# Patient Record
Sex: Male | Born: 1960
Health system: Southern US, Community
[De-identification: ages and names within clinical notes are randomized; demographics above are authoritative.]

## PROBLEM LIST (undated history)

## (undated) DIAGNOSIS — G4730 Sleep apnea, unspecified: Secondary | ICD-10-CM

## (undated) DIAGNOSIS — F909 Attention-deficit hyperactivity disorder, unspecified type: Secondary | ICD-10-CM

## (undated) DIAGNOSIS — G2581 Restless legs syndrome: Secondary | ICD-10-CM

## (undated) DIAGNOSIS — K219 Gastro-esophageal reflux disease without esophagitis: Secondary | ICD-10-CM

## (undated) DIAGNOSIS — J9811 Atelectasis: Secondary | ICD-10-CM

## (undated) DIAGNOSIS — S2249XA Multiple fractures of ribs, unspecified side, initial encounter for closed fracture: Secondary | ICD-10-CM

## (undated) DIAGNOSIS — K900 Celiac disease: Secondary | ICD-10-CM

## (undated) HISTORY — DX: Multiple fractures of ribs, unspecified side, initial encounter for closed fracture: S22.49XA

## (undated) HISTORY — DX: Gastro-esophageal reflux disease without esophagitis: K21.9

## (undated) HISTORY — PX: HERNIA REPAIR: SHX51

## (undated) HISTORY — DX: Atelectasis: J98.11

## (undated) HISTORY — PX: APPENDECTOMY: SHX54

## (undated) HISTORY — DX: Sleep apnea, unspecified: G47.30

## (undated) HISTORY — DX: Restless legs syndrome: G25.81

## (undated) HISTORY — DX: Attention-deficit hyperactivity disorder, unspecified type: F90.9

## (undated) HISTORY — DX: Celiac disease: K90.0

---

## 1998-12-08 ENCOUNTER — Ambulatory Visit (HOSPITAL_BASED_OUTPATIENT_CLINIC_OR_DEPARTMENT_OTHER): Admission: RE | Admit: 1998-12-08 | Discharge: 1998-12-08 | Payer: Self-pay | Admitting: Surgery

## 1999-03-07 ENCOUNTER — Encounter: Payer: Self-pay | Admitting: Family Medicine

## 1999-03-07 ENCOUNTER — Ambulatory Visit (HOSPITAL_COMMUNITY): Admission: RE | Admit: 1999-03-07 | Discharge: 1999-03-07 | Payer: Self-pay | Admitting: Family Medicine

## 2002-10-22 ENCOUNTER — Encounter: Payer: Self-pay | Admitting: Internal Medicine

## 2003-04-09 ENCOUNTER — Encounter: Payer: Self-pay | Admitting: Internal Medicine

## 2003-10-10 ENCOUNTER — Encounter: Payer: Self-pay | Admitting: Internal Medicine

## 2003-10-25 ENCOUNTER — Encounter: Payer: Self-pay | Admitting: Internal Medicine

## 2004-10-22 ENCOUNTER — Encounter: Payer: Self-pay | Admitting: Internal Medicine

## 2006-09-19 ENCOUNTER — Encounter: Payer: Self-pay | Admitting: Internal Medicine

## 2007-09-05 ENCOUNTER — Encounter: Payer: Self-pay | Admitting: Internal Medicine

## 2008-06-05 ENCOUNTER — Ambulatory Visit: Payer: Self-pay | Admitting: Internal Medicine

## 2008-06-05 DIAGNOSIS — G2581 Restless legs syndrome: Secondary | ICD-10-CM | POA: Insufficient documentation

## 2008-06-05 DIAGNOSIS — F329 Major depressive disorder, single episode, unspecified: Secondary | ICD-10-CM | POA: Insufficient documentation

## 2008-06-05 DIAGNOSIS — G4733 Obstructive sleep apnea (adult) (pediatric): Secondary | ICD-10-CM | POA: Insufficient documentation

## 2009-06-16 ENCOUNTER — Ambulatory Visit: Payer: Self-pay | Admitting: Internal Medicine

## 2009-06-17 LAB — CONVERTED CEMR LAB
ALT: 24 units/L (ref 0–53)
AST: 29 units/L (ref 0–37)
Albumin: 4 g/dL (ref 3.5–5.2)
Alkaline Phosphatase: 102 units/L (ref 39–117)
BUN: 13 mg/dL (ref 6–23)
Basophils Absolute: 0.1 10*3/uL (ref 0.0–0.1)
Basophils Relative: 0.7 % (ref 0.0–3.0)
Bilirubin Urine: NEGATIVE
Bilirubin, Direct: 0.1 mg/dL (ref 0.0–0.3)
CO2: 30 meq/L (ref 19–32)
Calcium: 8.9 mg/dL (ref 8.4–10.5)
Chloride: 104 meq/L (ref 96–112)
Cholesterol: 120 mg/dL (ref 0–200)
Creatinine, Ser: 1.1 mg/dL (ref 0.4–1.5)
Eosinophils Absolute: 0.1 10*3/uL (ref 0.0–0.7)
Eosinophils Relative: 1.6 % (ref 0.0–5.0)
GFR calc non Af Amer: 75.22 mL/min (ref >60)
Glucose, Bld: 91 mg/dL (ref 70–99)
HCT: 41 % (ref 39.0–52.0)
HDL: 29.2 mg/dL — ABNORMAL LOW (ref >39.00)
Hemoglobin: 13.8 g/dL (ref 13.0–17.0)
Ketones, ur: NEGATIVE mg/dL
LDL Cholesterol: 70 mg/dL (ref 0–99)
Leukocytes, UA: NEGATIVE
Lymphocytes Relative: 35.4 % (ref 12.0–46.0)
Lymphs Abs: 3.2 10*3/uL (ref 0.7–4.0)
MCHC: 33.6 g/dL (ref 30.0–36.0)
MCV: 88.8 fL (ref 78.0–100.0)
Monocytes Absolute: 0.7 10*3/uL (ref 0.1–1.0)
Monocytes Relative: 7.6 % (ref 3.0–12.0)
Neutro Abs: 4.9 10*3/uL (ref 1.4–7.7)
Neutrophils Relative %: 54.7 % (ref 43.0–77.0)
Nitrite: NEGATIVE
PSA: 0.64 ng/mL (ref 0.10–4.00)
Platelets: 288 10*3/uL (ref 150.0–400.0)
Potassium: 4 meq/L (ref 3.5–5.1)
RBC: 4.62 M/uL (ref 4.22–5.81)
RDW: 12.3 % (ref 11.5–14.6)
Sodium: 141 meq/L (ref 135–145)
Specific Gravity, Urine: >=1.03 (ref 1.000–1.030)
TSH: 2.25 microintl units/mL (ref 0.35–5.50)
Total Bilirubin: 0.6 mg/dL (ref 0.3–1.2)
Total CHOL/HDL Ratio: 4
Total Protein, Urine: NEGATIVE mg/dL
Total Protein: 7.7 g/dL (ref 6.0–8.3)
Triglycerides: 102 mg/dL (ref 0.0–149.0)
Urine Glucose: NEGATIVE mg/dL
Urobilinogen, UA: 0.2 (ref 0.0–1.0)
VLDL: 20.4 mg/dL (ref 0.0–40.0)
WBC: 9 10*3/uL (ref 4.5–10.5)
pH: 6 (ref 5.0–8.0)

## 2009-06-30 ENCOUNTER — Ambulatory Visit: Payer: Self-pay | Admitting: Internal Medicine

## 2009-06-30 DIAGNOSIS — G4700 Insomnia, unspecified: Secondary | ICD-10-CM | POA: Insufficient documentation

## 2009-06-30 DIAGNOSIS — R109 Unspecified abdominal pain: Secondary | ICD-10-CM | POA: Insufficient documentation

## 2009-07-01 ENCOUNTER — Telehealth: Payer: Self-pay | Admitting: Gastroenterology

## 2009-07-02 ENCOUNTER — Ambulatory Visit: Payer: Self-pay | Admitting: Gastroenterology

## 2009-07-02 DIAGNOSIS — R197 Diarrhea, unspecified: Secondary | ICD-10-CM | POA: Insufficient documentation

## 2009-07-09 ENCOUNTER — Telehealth: Payer: Self-pay | Admitting: Internal Medicine

## 2009-07-11 ENCOUNTER — Telehealth (INDEPENDENT_AMBULATORY_CARE_PROVIDER_SITE_OTHER): Payer: Self-pay | Admitting: *Deleted

## 2009-07-15 ENCOUNTER — Telehealth (INDEPENDENT_AMBULATORY_CARE_PROVIDER_SITE_OTHER): Payer: Self-pay | Admitting: *Deleted

## 2009-07-16 ENCOUNTER — Ambulatory Visit: Payer: Self-pay | Admitting: Gastroenterology

## 2009-07-16 LAB — CONVERTED CEMR LAB
Fecal Occult Blood: NEGATIVE
OCCULT 1: NEGATIVE
OCCULT 2: NEGATIVE
OCCULT 3: NEGATIVE
OCCULT 4: NEGATIVE
OCCULT 5: NEGATIVE

## 2009-07-17 LAB — CONVERTED CEMR LAB: Sed Rate: 47 mm/hr — ABNORMAL HIGH (ref 0–22)

## 2009-07-18 ENCOUNTER — Ambulatory Visit: Payer: Self-pay | Admitting: Gastroenterology

## 2009-07-18 LAB — CONVERTED CEMR LAB: Tissue Transglutaminase Ab, IgA: >128 units — ABNORMAL HIGH (ref <7)

## 2009-07-21 ENCOUNTER — Encounter: Payer: Self-pay | Admitting: Gastroenterology

## 2009-07-21 ENCOUNTER — Ambulatory Visit: Payer: Self-pay | Admitting: Gastroenterology

## 2009-07-23 ENCOUNTER — Encounter: Payer: Self-pay | Admitting: Gastroenterology

## 2009-07-23 DIAGNOSIS — K900 Celiac disease: Secondary | ICD-10-CM | POA: Insufficient documentation

## 2009-07-29 ENCOUNTER — Telehealth: Payer: Self-pay | Admitting: Internal Medicine

## 2009-08-12 ENCOUNTER — Encounter: Admission: RE | Admit: 2009-08-12 | Discharge: 2009-08-12 | Payer: Self-pay | Admitting: Gastroenterology

## 2009-08-13 ENCOUNTER — Encounter: Payer: Self-pay | Admitting: Gastroenterology

## 2009-08-19 ENCOUNTER — Ambulatory Visit: Payer: Self-pay | Admitting: Gastroenterology

## 2009-08-19 DIAGNOSIS — K219 Gastro-esophageal reflux disease without esophagitis: Secondary | ICD-10-CM | POA: Insufficient documentation

## 2009-08-19 DIAGNOSIS — K2970 Gastritis, unspecified, without bleeding: Secondary | ICD-10-CM | POA: Insufficient documentation

## 2009-08-19 DIAGNOSIS — K2990 Gastroduodenitis, unspecified, without bleeding: Secondary | ICD-10-CM

## 2009-08-22 ENCOUNTER — Ambulatory Visit: Payer: Self-pay | Admitting: Internal Medicine

## 2009-08-22 ENCOUNTER — Encounter: Payer: Self-pay | Admitting: Gastroenterology

## 2010-01-19 ENCOUNTER — Encounter (INDEPENDENT_AMBULATORY_CARE_PROVIDER_SITE_OTHER): Payer: Self-pay | Admitting: *Deleted

## 2010-06-09 ENCOUNTER — Encounter: Payer: Self-pay | Admitting: Gastroenterology

## 2010-06-18 ENCOUNTER — Encounter (INDEPENDENT_AMBULATORY_CARE_PROVIDER_SITE_OTHER): Payer: Self-pay | Admitting: *Deleted

## 2010-12-06 LAB — CONVERTED CEMR LAB
ALT: 25 units/L (ref 0–53)
AST: 29 units/L (ref 0–37)
Albumin: 4 g/dL (ref 3.5–5.2)
Alkaline Phosphatase: 100 units/L (ref 39–117)
BUN: 10 mg/dL (ref 6–23)
Basophils Absolute: 0.1 10*3/uL (ref 0.0–0.1)
Basophils Relative: 0.8 % (ref 0.0–3.0)
Bilirubin, Direct: 0.1 mg/dL (ref 0.0–0.3)
CO2: 30 meq/L (ref 19–32)
Calcium: 9 mg/dL (ref 8.4–10.5)
Chloride: 100 meq/L (ref 96–112)
Cholesterol: 133 mg/dL (ref 0–200)
Creatinine, Ser: 1.2 mg/dL (ref 0.4–1.5)
Eosinophils Absolute: 0.2 10*3/uL (ref 0.0–0.7)
Eosinophils Relative: 2.7 % (ref 0.0–5.0)
GFR calc Af Amer: 83 mL/min
GFR calc non Af Amer: 68 mL/min
Glucose, Bld: 88 mg/dL (ref 70–99)
HCT: 40.1 % (ref 39.0–52.0)
HDL: 30 mg/dL — ABNORMAL LOW (ref >39.0)
Hemoglobin: 13.8 g/dL (ref 13.0–17.0)
LDL Cholesterol: 81 mg/dL (ref 0–99)
Lymphocytes Relative: 41.2 % (ref 12.0–46.0)
MCHC: 34.5 g/dL (ref 30.0–36.0)
MCV: 89.5 fL (ref 78.0–100.0)
Monocytes Absolute: 0.5 10*3/uL (ref 0.1–1.0)
Monocytes Relative: 7.4 % (ref 3.0–12.0)
Neutro Abs: 3 10*3/uL (ref 1.4–7.7)
Neutrophils Relative %: 47.9 % (ref 43.0–77.0)
PSA: 0.53 ng/mL (ref 0.10–4.00)
Platelets: 295 10*3/uL (ref 150–400)
Potassium: 4.4 meq/L (ref 3.5–5.1)
RBC: 4.48 M/uL (ref 4.22–5.81)
RDW: 12.8 % (ref 11.5–14.6)
Sodium: 139 meq/L (ref 135–145)
TSH: 2.2 microintl units/mL (ref 0.35–5.50)
Total Bilirubin: 0.7 mg/dL (ref 0.3–1.2)
Total CHOL/HDL Ratio: 4.4
Total Protein: 7.6 g/dL (ref 6.0–8.3)
Triglycerides: 110 mg/dL (ref 0–149)
VLDL: 22 mg/dL (ref 0–40)
Vit D, 1,25-Dihydroxy: >150 — ABNORMAL HIGH (ref 30–89)
WBC: 6.5 10*3/uL (ref 4.5–10.5)

## 2010-12-08 NOTE — Procedures (Signed)
Summary: Endoscopy   EGD  Procedure date:  07/21/2009  Findings:      Location: [place]    Procedures Next Due Date:    EGD: 07/2010 ENDOSCOPY PROCEDURE REPORT  PATIENT:  Aaron [name], Aaron [name]  MR#:  [id number] BIRTHDATE:   08-09-1961, 50 yrs. old   GENDER:   male  ENDOSCOPIST:   Norberto Sorenson T. Fuller Plan, MD, [facility]    PROCEDURE DATE:  07/21/2009 PROCEDURE:  EGD with biopsy ASA CLASS:   Class I INDICATIONS: diarrhea, elevated celiac disease serologies  MEDICATIONS:   Versed 2 mg IV, residual sedation present from prior procedure TOPICAL ANESTHETIC:   Exactacain Spray  DESCRIPTION OF PROCEDURE:   After the risks benefits and alternatives of the procedure were thoroughly explained, informed consent was obtained.  The LB [id number] endoscope was introduced through the mouth and advanced to the second portion of the duodenum, without limitations.  The instrument was slowly withdrawn as the mucosa was fully examined. <<PROCEDUREIMAGES>>          <<OLD IMAGES>>  The duodenal bulb was normal in appearance, as was the postbulbar duodenum. In the second portion of the duodenum, multiple biopsies were obtained and sent to pathology.  Mild, nonerosive gastritis was found in the body and fundus of the stomach. Multiple biopsies were obtained. Esophagitis was found in the distal esophagus. It was erosive. LA Class Grade A. Otherwise the examination was normal. Retroflexed views revealed no abnormalities.    The scope was then withdrawn from the patient and the procedure completed.  COMPLICATIONS:   None  ENDOSCOPIC IMPRESSION:  1) Normal duodenum   2) Moderate gastritis  3) Erosive esophagitis  RECOMMENDATIONS:  1) await pathology results  2) anti-reflux regimen  3) follow-up: GI clinic 1 month 4) PPI qam, omeprazole 14m PO qam #30, 11 refills    Skyah Hannon T. SFuller Plan MD, FMarval Regal   CC: JBiagio Borg MD    REPORT OF SURGICAL PATHOLOGY   Case #:  [id number] Name: Aaron [name] Office Chart Number:  N/A [id number]: [id number]: JChrystie Nose PSaralyn Pilar MD DOB/Age  [birth year](Age: [age]    Gender: M Date Taken:  07/21/2009 Date Received: 07/21/2009   FINAL DIAGNOSIS   ***MICROSCOPIC EXAMINATION AND DIAGNOSIS***   1.  COLON, RANDOM BIOPSIES:  MELANOSIS COLI.  NO ACTIVE INFLAMMATION, MICROSCOPIC COLITIS, OR GRANULOMAS.   2. COLON, SIGMOID POLYP:  MELANOSIS COLI.  NO ACTIVE INFLAMMATION, ADENOMATOUS CHANGE, OR EVIDENCE OF MALIGNANCY.   3. COLON, SIGMOID AND RECTAL BIOPSIES:  FOCAL ACTIVE COLITIS.  SEE COMMENT.        4. DUODENAL BIOPSIES:  FINDINGS CONSISTENT WITH CELIAC SPRUE.   5. GASTRIC BIOPSIES:  CHRONIC MILDLY ACTIVE GASTRITIS.  NO HELICOBACTER PYLORI, DYSPLASIA, OR MALIGNANCY IDENTIFIED.   COMMENT 1. The random biopsies have normal crypt architecture and there are focal pigmented macrophages in the lamina propria consistent with melanosis coli.  No active inflammation, granulomas, or microscopic colitis is identified.   2. The sigmoid biopsy also shows focal pigmented macrophages in the lamina propria consistent with melanosis coli. No active inflammation or granulomas are identified.   3. The sigmoid and rectal biopsies show patchy foci with mild active neutrophilic cryptitis. There is also focal, minimal altered crypt architecture.  The differential diagnosis includes Crohn' s and inflammatory changes from an area of diverticulitis. No granulomas are identified.   4. The duodenal biopsies show partial loss of villous architecture associated with increased infiltrate in the lamina propria, increased intraepithelial infiltrate  and crypt hyperplasia.  This constellation is consistent with celiac sprue. No granulomas are identified.   5. A Warthin-Starry stain is performed to determine the possibility of the presence of Helicobacter pylori. The Warthin-Starry stain is negative for organisms of  Helicobacter pylori. The control(s) stained appropriately.  (JP:jy) 07/22/09   jy Date Reported:  07/23/2009     Chrystie Nose. Saralyn Pilar, MD *** Electronically Signed Out By JDP ***    July 23, 2009 MRN: [id number]    Aaron [name] [address] [place], [place]  [address]    Dear Mr. Delma Post,  I am pleased to inform you that the biopsies taken during your recent endoscopic examination did not show any evidence of cancer upon pathologic examination.   The biopsies showed gastritis and celiac disease.  Please call [phone] to schedule a return visit to review      your condition.  Continue with the treatment plan as outlined on the day of your      exam.  You should have a repeat endoscopic examination for this problem              in 1 year.  Please call us if you are having persistent problems or have questions about your condition that have not been fully answered at this time.  Sincerely,  Ladene Artist MD [facility]  This letter has been electronically signed by your physician. This report was created from the original endoscopy report, which was reviewed and signed by the above listed endoscopist.

## 2010-12-08 NOTE — Consult Note (Signed)
Summary: [facility]  [facility]   Imported By: Jerrye Noble D'jimraou 06/07/2008 14:36:31  _____________________________________________________________________  External Attachment:    Type:   Image     Comment:   External Document

## 2010-12-08 NOTE — Miscellaneous (Signed)
Summary: Recovery/med  Clinical Lists Changes  Medications: Added new medication of OMEPRAZOLE 20 MG CPDR (OMEPRAZOLE) As directed - Signed Rx of OMEPRAZOLE 20 MG CPDR (OMEPRAZOLE) As directed;  #30 x 11;  Signed;  Entered by: Ernestine Conrad RN;  Authorized by: Ladene Artist MD [facility];  Method used: Electronically to [facility]. [place]. [phone]*, [address], [place], [place], [place]  [address], Ph: [phone] or [phone], Fax: [phone] Observations: Added new observation of ALLERGY REV: Done (07/21/2009 11:18)    Prescriptions: OMEPRAZOLE 20 MG CPDR (OMEPRAZOLE) As directed  #30 x 11   Entered by:   Ernestine Conrad RN   Authorized by:   Ladene Artist MD [facility]   Signed by:   Ernestine Conrad RN on 07/21/2009   Method used:   Electronically to        [facility]. [address] [phone]* (retail)       [address]       [place], [place]  [address]       Ph: [phone] or [phone]       Fax: [phone]   RxID:   [id number]

## 2010-12-08 NOTE — Consult Note (Signed)
Summary: [facility]   Imported By: Edmonia James 05/01/2008 12:41:51  _____________________________________________________________________  External Attachment:    Type:   Image     Comment:   External Document

## 2010-12-08 NOTE — Assessment & Plan Note (Signed)
Summary: NEW PATIENT PHYSICAL/RESCHED 4X/LED   Vital Signs:  Patient Profile:   50 Years Old Male Weight:      202 pounds Temp:     97.0 degrees F oral Pulse rate:   58 / minute BP sitting:   130 / 84  (right arm) Cuff size:   regular  Vitals Entered By: Julious Payer (June 05, 2008 2:30 PM)                  Chief Complaint:  new pt. CPX.  History of Present Illness: tried the requip and the mirapex but seemed to make him worse; some ongoing nerves; says many meds have the opposite effect for him such as antihistamines that make him hyper    Updated Prior Medication List: FISH OIL   OIL (FISH OIL) Take 1 tablet by mouth once a day MIRTAZAPINE 30 MG  TBDP (MIRTAZAPINE) 1 by mouth at bedtime  Current Allergies (reviewed today): No known allergies   Past Medical History:    Reviewed history and no changes required:       chronic insomnia       OSA       RLS       Depression  Past Surgical History:    Reviewed history and no changes required:       Appendectomy-1997       Hernia-2000   Family History:    Reviewed history and no changes required:       Family History of Alcoholism/Addiction - father       Family History of Arthritis - mother bilat hip replacements       Family History Diabetes - mother       Family History Hypertension - both parents  Social History:    Reviewed history and no changes required:       Alcohol use-yes       Married       3 children       work - Electrical engineer - Production designer, theatre/television/film       Never Smoked   Risk Factors:  Tobacco use:  never Alcohol use:  yes    Type:  beer, mixed drinks   Review of Systems  The patient denies anorexia, fever, weight loss, weight gain, vision loss, decreased hearing, hoarseness, chest pain, syncope, dyspnea on exertion, peripheral edema, prolonged cough, headaches, hemoptysis, abdominal pain, melena, hematochezia, severe indigestion/heartburn, hematuria, incontinence, genital sores, muscle weakness,  suspicious skin lesions, transient blindness, difficulty walking, depression, unusual weight change, abnormal bleeding, enlarged lymph nodes, angioedema, breast masses, and testicular masses.         all otherwise negative    Physical Exam  General:     Well-developed,well-nourished,in no acute distress; alert,appropriate and cooperative throughout examination Head:     Normocephalic and atraumatic without obvious abnormalities. No apparent alopecia or balding. Eyes:     No corneal or conjunctival inflammation noted. EOMI. Perrla. Funduscopic exam benign, without hemorrhages, exudates or papilledema. Vision grossly normal. Ears:     External ear exam shows no significant lesions or deformities.  Otoscopic examination reveals clear canals, tympanic membranes are intact bilaterally without bulging, retraction, inflammation or discharge. Hearing is grossly normal bilaterally. Nose:     External nasal examination shows no deformity or inflammation. Nasal mucosa are pink and moist without lesions or exudates. Mouth:     Oral mucosa and oropharynx without lesions or exudates.  Teeth in good repair. Neck:     No deformities, masses,  or tenderness noted. Lungs:     Normal respiratory effort, chest expands symmetrically. Lungs are clear to auscultation, no crackles or wheezes. Heart:     Normal rate and regular rhythm. S1 and S2 normal without gallop, murmur, click, rub or other extra sounds. Abdomen:     Bowel sounds positive,abdomen soft and non-tender without masses, organomegaly or hernias noted. Genitalia:     Testes bilaterally descended without nodularity, tenderness or masses. No scrotal masses or lesions. No penis lesions or urethral discharge. Msk:     No deformity or scoliosis noted of thoracic or lumbar spine.   Extremities:     No clubbing, cyanosis, edema, or deformity noted with normal full range of motion of all joints.   Neurologic:     No cranial nerve deficits noted.  Station and gait are normal. Plantar reflexes are down-going bilaterally. DTRs are symmetrical throughout. Sensory, motor and coordinative functions appear intact.    Impression & Recommendations:  Problem # 1:  Preventive Health Care (ICD-V70.0) Overall doing well, up to date, counseled on routine health concerns for screening and prevention, immunizations up to date or declined, labs reviewed, ecg reviewed, to do tetanus today Orders: EKG w/ Interpretation (93000) TLB-BMP (Basic Metabolic Panel-BMET) (15945-OPFYTWK) TLB-CBC Platelet - w/Differential (85025-CBCD) TLB-Hepatic/Liver Function Pnl (80076-HEPATIC) TLB-Lipid Panel (80061-LIPID) TLB-PSA (Prostate Specific Antigen) (84153-PSA) TLB-TSH (Thyroid Stimulating Hormone) (84443-TSH) T-Vitamin D (25-Hydroxy) (46286-38177)   Problem # 2:  RESTLESS LEGS SYNDROME (ICD-333.94) try the mirtazipine 30 at bedtime - hopefully sedative enough to help with the RLS that wakes him up during the night; consider clonopin if no help  Complete Medication List: 1)  Fish Oil Oil (Fish oil) .... Take 1 tablet by mouth once a day 2)  Mirtazapine 30 Mg Tbdp (Mirtazapine) .Marland Kitchen.. 1 by mouth at bedtime  Other Orders: EKG w/ Interpretation (93000)   Patient Instructions: 1)  Please take all new medications as prescribed 2)  Continue all medications that you may have been taking previously 3)  Please go to the Lab in the basement for your blood tests today 4)  Please schedule a follow-up appointment in 1 year or sooner if needed   Prescriptions: MIRTAZAPINE 30 MG  TBDP (MIRTAZAPINE) 1 by mouth at bedtime  #30 x 11   Entered and Authorized by:   Biagio Borg MD   Signed by:   Biagio Borg MD on 06/05/2008   Method used:   Print then Give to Patient   RxID:   [id number]  ]  Orders Added: 1)  EKG w/ Interpretation [93000] 2)  EKG w/ Interpretation [93000] 3)  TLB-BMP (Basic Metabolic Panel-BMET) [19166-MAYOKHT] 4)  TLB-CBC Platelet -  w/Differential [85025-CBCD] 5)  TLB-Hepatic/Liver Function Pnl [80076-HEPATIC] 6)  TLB-Lipid Panel [80061-LIPID] 7)  TLB-PSA (Prostate Specific Antigen) [84153-PSA] 8)  TLB-TSH (Thyroid Stimulating Hormone) [84443-TSH] 9)  T-Vitamin D (25-Hydroxy) [97741-42395] 10)  New Patient 40-64 years [99386]    Tetanus/Td Vaccine    Vaccine Type: Tdap    Site: right deltoid    Mfr: GlaxoSmithKline    Dose: 0.5 ml    Route: IM    Given by: Julious Payer    Exp. Date: 10/12/2010    Lot #: [id number]    VIS given: 09/26/07 version given June 05, 2008.

## 2010-12-08 NOTE — Assessment & Plan Note (Signed)
Summary: cpx/$50/bcbs   Vital Signs:  Patient profile:   50 year old male Height:      72 inches Weight:      203.25 pounds BMI:     27.67 O2 Sat:      94 % Temp:     97.1 degrees F oral Pulse rate:   61 / minute BP sitting:   108 / 84  (left arm) Cuff size:   regular  Vitals Entered By: Hector Brunswick (June 30, 2009 1:08 PM)  Preventive Care Screening  Last Tetanus Booster:    Date:  11/08/2006    Results:  Tdap   CC: CPX   CC:  CPX.  History of Present Illness: here with ongoing issues with loose stool and freq up to 4 or 5 times per day with mucous and abd pains, dull and crampy, low grade but persistent and overall just does not "feel well"  ; TUMs and pepto bismol can help some;  some occasional accidents with flatulence involving stool as well; lost about 10 lbs he thinks; no nausea or vomit; discomfort mostly bilaterl lower, s/p appy 1997 and subsequent incisional hernia repair 3 yrs later; food doesnt necessailoy make it worse but sometimes seems to "go straight through" ;  tries to stay active wit swiimming almost 5oo yds daily and 2 miles running some days but not as active lately;  ? more stress lately but has been ongoing since early july and really not stressed then, and has persisted ; no hematochezia;  Problems Prior to Update: 1)  Abdominal Pain Other Specified Site  (ICD-789.09) 2)  Insomnia-sleep Disorder-unspec  (ICD-780.52) 3)  Preventive Health Care  (ICD-V70.0) 4)  Preventive Health Care  (ICD-V70.0) 5)  Depression  (ICD-311) 6)  Restless Legs Syndrome  (ICD-333.94) 7)  Obstructive Sleep Apnea  (ICD-327.23) 8)  Family History Diabetes 1st Degree Relative  (ICD-V18.0) 9)  Family History of Alcoholism/addiction  (ICD-V61.41)  Medications Prior to Update: 1)  Fish Oil   Oil (Fish Oil) .... Take 1 Tablet By Mouth Once A Day 2)  Mirtazapine 30 Mg  Tbdp (Mirtazapine) .Marland Kitchen.. 1 By Mouth At Bedtime  Current Medications (verified): 1)  Fish Oil   Oil (Fish  Oil) .... Take 1 Tablet By Mouth Once A Day 2)  Zolpidem Tartrate 10 Mg Tabs (Zolpidem Tartrate) .Marland Kitchen.. 1 By Mouth At Bedtime As Needed 3)  Pramipexole Dihydrochloride 0.5 Mg Tabs (Pramipexole Dihydrochloride) .Marland Kitchen.. 1po At Bedtime For Rls  Allergies (verified): No Known Drug Allergies  Past History:  Past Surgical History: Last updated: 06/05/2008 Appendectomy-1997 Hernia-2000  Family History: Last updated: 06/05/2008 Family History of Alcoholism/Addiction - father Family History of Arthritis - mother bilat hip replacements Family History Diabetes - mother Family History Hypertension - both parents  Social History: Last updated: 06/05/2008 Alcohol use-yes Married 3 children work - Electrical engineer - Production designer, theatre/television/film Never Smoked  Risk Factors: Smoking Status: never (06/05/2008)  Past Medical History: chronic insomnia OSA - mild RLS Depression  Review of Systems  The patient denies anorexia, fever, weight loss, weight gain, vision loss, decreased hearing, hoarseness, chest pain, syncope, dyspnea on exertion, peripheral edema, prolonged cough, headaches, hemoptysis, abdominal pain, melena, hematochezia, severe indigestion/heartburn, hematuria, incontinence, muscle weakness, suspicious skin lesions, transient blindness, difficulty walking, depression, unusual weight change, abnormal bleeding, enlarged lymph nodes, and angioedema.         all otherwise negative - 12 system review done except for a general weakness  Physical Exam  General:  alert and  overweight-appearing.   Head:  normocephalic and atraumatic.   Eyes:  vision grossly intact, pupils equal, and pupils round.   Ears:  R ear normal and L ear normal.   Nose:  no external deformity and no nasal discharge.   Mouth:  no gingival abnormalities and pharynx pink and moist.   Neck:  supple and no masses.   Lungs:  normal respiratory effort and normal breath sounds.   Heart:  normal rate and regular rhythm.   Abdomen:   soft, non-tender, and normal bowel sounds.   Msk:  normal ROM, no joint tenderness, and no joint swelling.   Extremities:  no edema, no erythema  Neurologic:  cranial nerves II-XII intact and strength normal in all extremities.     Impression & Recommendations:  Problem # 1:  Preventive Health Care (ICD-V70.0)  Overall doing well, up to date, counseled on routine health concerns for screening and prevention, immunizations up to date or declined, labs reviewed, ecg reviewed  Orders: EKG w/ Interpretation (93000)  Problem # 2:  INSOMNIA-SLEEP DISORDER-UNSPEC (ICD-780.52)  His updated medication list for this problem includes:    Zolpidem Tartrate 10 Mg Tabs (Zolpidem tartrate) .Marland Kitchen... 1 by mouth at bedtime as needed treat as above, f/u any worsening signs or symptoms   Problem # 3:  RESTLESS LEGS SYNDROME (ICD-333.94) try the mirapex qhs  Problem # 4:  ABDOMINAL PAIN OTHER SPECIFIED SITE (ICD-789.09) mid to lower abd, chronic x 6 mo, no blood but has mucous and wt loss - refer GI - likely need EGD and colonoscopy but pt wants to talk to wife about [facility] to be referred to  Complete Medication List: 1)  Fish Oil Oil (Fish oil) .... Take 1 tablet by mouth once a day 2)  Zolpidem Tartrate 10 Mg Tabs (Zolpidem tartrate) .Marland Kitchen.. 1 by mouth at bedtime as needed 3)  Pramipexole Dihydrochloride 0.5 Mg Tabs (Pramipexole dihydrochloride) .Marland Kitchen.. 1po at bedtime for rls  Patient Instructions: 1)  Please take all new medications as prescribed  2)  Continue all previous medications as before this visit  3)  Please schedule a follow-up appointment in 1 year or sooner if needed Prescriptions: PRAMIPEXOLE DIHYDROCHLORIDE 0.5 MG TABS (PRAMIPEXOLE DIHYDROCHLORIDE) 1po at bedtime for RLS  #30 x 5   Entered and Authorized by:   Biagio Borg MD   Signed by:   Biagio Borg MD on 06/30/2009   Method used:   Print then Give to Patient   RxID:   [id number] ZOLPIDEM TARTRATE 10 MG TABS  (ZOLPIDEM TARTRATE) 1 by mouth at bedtime as needed  #30 x 5   Entered and Authorized by:   Biagio Borg MD   Signed by:   Biagio Borg MD on 06/30/2009   Method used:   Print then Give to Patient   RxID:   [id number]

## 2010-12-08 NOTE — Miscellaneous (Signed)
Summary: BONE DENSITY  Clinical Lists Changes  Orders: Added new Test order of T-Bone Densitometry [phone]) - Signed Added new Test order of T-Lumbar Vertebral Assessment [phone]) - Signed

## 2010-12-08 NOTE — Assessment & Plan Note (Signed)
Summary: follow up colon/endo celiac   History of Present Illness Visit Type: follow up  Primary GI MD: Joylene Igo MD [facility] Primary Provider: Cathlean Cower, MD Requesting Provider: n/a Chief Complaint: F/u from colon/endo. Pt states fatigue but denies any GI complaints History of Present Illness:   Aaron [name] has had almost complete resolution of his diarrhea on a gluten-free diet. He states he has 1 to 2 stools that are almost normal and formed. He has significant fatigue. He discontinued omeprazole as he was not having any upper gastrointestinal symptoms. He has multiple questions regarding long-term management celiac, gastritis, duodenitis, and GERD.   GI Review of Systems      Denies abdominal pain, acid reflux, belching, bloating, chest pain, dysphagia with liquids, dysphagia with solids, heartburn, loss of appetite, nausea, vomiting, vomiting blood, weight loss, and  weight gain.        Denies anal fissure, black tarry stools, change in bowel habit, constipation, diarrhea, diverticulosis, fecal incontinence, heme positive stool, hemorrhoids, irritable bowel syndrome, jaundice, light color stool, liver problems, rectal bleeding, and  rectal pain.   Current Medications (verified): 1)  Fish Oil   Oil (Fish Oil) .... Take 1 Tablet By Mouth Once A Day 2)  Pramipexole Dihydrochloride 0.5 Mg Tabs (Pramipexole Dihydrochloride) .Marland Kitchen.. 1po At Bedtime For Rls 3)  Iron 325 (65 Fe) Mg Tabs (Ferrous Sulfate) .... Take 3-4 Times Weekly 4)  Zolpidem Tartrate 10 Mg Tabs (Zolpidem Tartrate) .Marland Kitchen.. 1 By Mouth At Bedtime As Needed  Allergies (verified): No Known Drug Allergies  Past History:  Past Medical History: Chronic insomnia OSA - mild RLS Depression Celiac disease Erosive esophagitis Gastritis Duodenitis  Past Surgical History: Reviewed history from 06/05/2008 and no changes required. Appendectomy-1997 Hernia-2000  Family History: Reviewed history from 07/16/2009 and no changes  required. Family History of Alcoholism/Addiction - father Family History of Arthritis - mother bilat hip replacements Family History Diabetes - mother Family History Hypertension - both parents No FH of Colon Cancer:  Social History: Reviewed history from 07/02/2009 and no changes required. 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         The pertinent positives and negatives are noted as above and in the HPI. All other ROS were reviewed and were negative.   Vital Signs:  Patient profile:   50 year old male Height:      72 inches Weight:      197 pounds BMI:     26.81 BSA:     2.12 Pulse rate:   74 / minute Pulse rhythm:   regular BP sitting:   138 / 80  (left arm) Cuff size:   regular  Vitals Entered By: Aaron [name] CMA (August 19, 2009 1:40 PM)  Physical Exam  General:  Well developed, well nourished, no acute distress. Head:  Normocephalic and atraumatic. Mouth:  No deformity or lesions, dentition normal. Lungs:  Clear throughout to auscultation. Heart:  Regular rate and rhythm; no murmurs, rubs,  or bruits. Abdomen:  Soft, nontender and nondistended. No masses, hepatosplenomegaly or hernias noted. Normal bowel sounds. Psych:  Alert and cooperative. Normal mood and affect.   Impression & Recommendations:  Problem # 1:  CELIAC SPRUE (ICD-579.0) Long-term gluten-free diet. He has had a good response to the gluten-free diet thus far. He is advised to have a celiac antibody profile  performed in 6 months and at 12 months and to have a repeat endoscopy in one year. Schedule bone scan to rule out osteoporosis, osteopenia.  Problem # 2:  GERD (ICD-530.81) Erosive esophagitis on endoscopy without active reflux symptoms. We discussed the long-term management with omeprazole to prevent long-term complications. Long-term  antireflux measures supplied the patient.  Problem # 3:  GASTRITIS (ICD-535.50) omeprazole 20 mg daily for 8 weeks.  Patient Instructions: 1)  Call me back with a appt date and time for the Bone scan and I can schedule that for you.  2)  Please schedule a follow-up appointment in 6 months. 3)  The medication list was reviewed and reconciled.  All changed / newly prescribed medications were explained.  A complete medication list was provided to the patient / caregiver.  Appended Document: Orders Update    Clinical Lists Changes  Orders: Added new Test order of T-Bone Densitometry [phone]) - Signed

## 2010-12-08 NOTE — Letter (Signed)
Summary: Wellness Program  Wellness Program   Imported By: Jerrye Noble D'jimraou 06/07/2008 14:38:46  _____________________________________________________________________  External Attachment:    Type:   Image     Comment:   External Document

## 2010-12-08 NOTE — Letter (Signed)
Summary: Patient Notice- Colon Biospy Results  [facility]  [address] [place], [place] [address]   Phone: [phone]  Fax: [phone]        July 23, 2009 MRN: [id number]    Aaron [name] [address] [place], [place]  [address]    Dear [name],  I am pleased to inform you that the biopsies taken during your recent colonoscopy did not show any evidence of cancer upon pathologic examination. The biopsies showed a focal and nonspecific colitis which could be caused by a resolving infection.  Continue with the treatment plan as outlined on the day of your      exam.  You should have a repeat colonoscopy examination in 10 years.  Please call us if you are having persistent problems or have questions about your condition that have not been fully answered at this time.  Sincerely,  Ladene Artist MD [facility]  This letter has been electronically signed by your physician.

## 2010-12-08 NOTE — Miscellaneous (Signed)
Summary: Care plan/[facility]  Care plan/[employer]   Imported By: Bubba Hales 08/25/2009 08:18:36  _____________________________________________________________________  External Attachment:    Type:   Image     Comment:   External Document

## 2010-12-08 NOTE — Procedures (Signed)
Summary: Colonoscopy   Colonoscopy  Procedure date:  07/21/2009  Findings:      Location:  [place].    Procedures Next Due Date:    Colonoscopy: 07/2019 COLONOSCOPY PROCEDURE REPORT  PATIENT:  Aaron [name], Aaron [name]  MR#:  [id number] BIRTHDATE:   11-Mar-1961, 48 yrs. old   GENDER:   male  ENDOSCOPIST:   Norberto Sorenson T. Fuller Plan, MD, [facility]    PROCEDURE DATE:  07/21/2009 PROCEDURE:  Colonoscopy with biopsy ASA CLASS:   Class I INDICATIONS: 1) unexplained diarrhea   MEDICATIONS:    Fentanyl 125 mcg IV, Versed 12 mg IV  DESCRIPTION OF PROCEDURE:   After the risks benefits and alternatives of the procedure were thoroughly explained, informed consent was obtained.  Digital rectal exam was performed and revealed no abnormalities.   The LB [id number]-[id number] endoscope was introduced through the anus and advanced to the terminal ileum which was intubated for a short distance, without limitations.  The quality of the prep was excellent, using MoviPrep.  The instrument was then slowly withdrawn as the colon was fully examined. <<PROCEDUREIMAGES>>                <<OLD IMAGES>>  FINDINGS:  A mucosal abnormality in the rectum and sigmoid colon. It was erythematous. R/O prep related changes. Multiple biopsies were obtained and sent to pathology.  A sessile polyp was found in the sigmoid colon. It was 3 mm in size. The polyp was removed using cold biopsy forceps.  The terminal ileum appeared normal.  This was otherwise a normal examination of the colon.  Random biopsies were taken throughout the colon. Retroflexed views in the rectum revealed internal hemorrhoids, small. The time to cecum =  3.75  minutes. The scope was then withdrawn (time =  17.75  min) from the patient and the procedure completed.  COMPLICATIONS:   None  ENDOSCOPIC IMPRESSION:  1) Mucosal abnormality in the rectum and sigmoid colon  2) 3 mm sessile polyp in the sigmoid colon  3) Normal terminal ileum  4) Internal  hemorrhoids  RECOMMENDATIONS:  1) await pathology results  2) If the polyp removed today is adenomatous (pre-cancerous), you will need a repeat colonoscopy in 5 years. Otherwise you should continue to follow colorectal cancer screening guidelines for "routine risk" patients with colonoscopy in 10 years.     Pricilla Riffle. Fuller Plan, MD, [facility]    CC: Biagio Borg, MD   REPORT OF SURGICAL PATHOLOGY   Case #: [id number] Patient Name: Aaron [name], DOO. Office Chart Number:  N/A [id number] MRN: [id number] Pathologist: Chrystie Nose. Saralyn Pilar, MD DOB/Age  [birth year] (Age: 14)    Gender: M Date Taken:  07/21/2009 Date Received: 07/21/2009   FINAL DIAGNOSIS   ***MICROSCOPIC EXAMINATION AND DIAGNOSIS***   1.  COLON, RANDOM BIOPSIES:  MELANOSIS COLI.  NO ACTIVE INFLAMMATION, MICROSCOPIC COLITIS, OR GRANULOMAS.   2. COLON, SIGMOID POLYP:  MELANOSIS COLI.  NO ACTIVE INFLAMMATION, ADENOMATOUS CHANGE, OR EVIDENCE OF MALIGNANCY.   3. COLON, SIGMOID AND RECTAL BIOPSIES:  FOCAL ACTIVE COLITIS.  SEE COMMENT.        4. DUODENAL BIOPSIES:  FINDINGS CONSISTENT WITH CELIAC SPRUE.   5. GASTRIC BIOPSIES:  CHRONIC MILDLY ACTIVE GASTRITIS.  NO HELICOBACTER PYLORI, DYSPLASIA, OR MALIGNANCY IDENTIFIED.   COMMENT 1. The random biopsies have normal crypt architecture and there are focal pigmented macrophages in the lamina propria consistent with melanosis coli.  No active inflammation, granulomas, or microscopic colitis is identified.   2. The sigmoid biopsy also shows  focal pigmented macrophages in the lamina propria consistent with melanosis coli. No active inflammation or granulomas are identified.   3. The sigmoid and rectal biopsies show patchy foci with mild active neutrophilic cryptitis. There is also focal, minimal altered crypt architecture.  The differential diagnosis includes Crohn' s and inflammatory changes from an area of diverticulitis. No granulomas are identified.   4. The duodenal  biopsies show partial loss of villous architecture associated with increased infiltrate in the lamina propria, increased intraepithelial infiltrate and crypt hyperplasia.  This constellation is consistent with celiac sprue. No granulomas are identified.   5. A Warthin-Starry stain is performed to determine the possibility of the presence of Helicobacter pylori. The Warthin-Starry stain is negative for organisms of Helicobacter pylori. The control(s) stained appropriately.  (JP:jy) 07/22/09   jy Date Reported:  07/23/2009     Chrystie Nose. Saralyn Pilar, MD *** Electronically Signed Out By JDP ***    July 23, 2009 MRN: [id number]    Aaron [name] [address] [place], [place]  [address]    Dear [name],  I am pleased to inform you that the biopsies taken during your recent colonoscopy did not show any evidence of cancer upon pathologic examination. The biopsies showed a focal and nonspecific colitis which could be caused by a resolving infection.  Continue with the treatment plan as outlined on the day of your      exam.  You should have a repeat colonoscopy examination in 10 years.  Please call us if you are having persistent problems or have questions about your condition that have not been fully answered at this time.  Sincerely,  Ladene Artist MD [facility]  This letter has been electronically signed by your physician.  This report was created from the original endoscopy report, which was reviewed and signed by the above listed endoscopist.

## 2010-12-08 NOTE — Letter (Signed)
Summary: Endoscopy Letter  [facility]  [place], [place] [address]   Phone: [phone]  Fax: [phone]      June 18, 2010 MRN: [id number]   ORDELL PRICHETT [address] [place], [place]  [address]   Dear Mr. Delma Post,   According to your medical record, it is time for you to schedule an Endoscopy. Endoscopic screening is recommended for patients with certain upper digestive tract conditions because of associated increased risk for cancers of the upper digestive system.  This letter has been generated based on the recommendations made at the time of your prior procedure. If you feel that in your particular situation this may no longer apply, please contact our office.  Please call our office at [phone]) to schedule this appointment or to update your records at your earliest convenience.  Thank you for cooperating with [place] to provide you with the very best care possible.   Sincerely,  Norberto Sorenson T. Fuller Plan, M.D.  [facility] [phone]

## 2010-12-08 NOTE — Consult Note (Signed)
Summary: [facility]  [facility]   Imported By: Edmonia James 05/01/2008 12:36:59  _____________________________________________________________________  External Attachment:    Type:   Image     Comment:   External Document

## 2010-12-08 NOTE — Progress Notes (Signed)
  Phone Note Call from Patient Call back at [phone]   Caller: Patient Call For: Dr Jenny Reichmann Summary of Call: Pt left message on triage about recent office visit, pt would like a nurse to call him - he has a couple questions. Initial call taken by: Denice Paradise,  July 09, 2009 10:29 AM  Follow-up for Phone Call        called the number left in the other box on phone note and it was an automated system to a company could not get thru to even speak with pt so called pt house wife answered and stated to contact him on his cell number which she [phone].... called pt on cell .Marland Kitchen... he wanted to know could we send him a copy of his last BP reading for his job purposes because when he was in and Dr. Jenny Reichmann filled his form out he left that section blank on one of the pages.....done Follow-up by: Hector Brunswick,  July 09, 2009 12:00 PM

## 2010-12-08 NOTE — Progress Notes (Signed)
Summary: hemmoccult cards  Phone Note Outgoing Call   Call placed by: Christian Mate CMA Deborra Medina),  July 11, 2009 1:02 PM Summary of Call: pt was called and reminded to turn in hemmoccult cards. Initial call taken by: Christian Mate CMA ([place]),  July 11, 2009 1:03 PM

## 2010-12-08 NOTE — Assessment & Plan Note (Signed)
Summary: ABD PN/DIARRHEA/FH   History of Present Illness Visit Type: Initial Consult Primary GI MD: Joylene Igo MD [facility] Primary Provider: Cathlean Cower, MD Requesting Provider: Cathlean Cower, MD Chief Complaint: Abd Pain & diarrhea x5 daily, since early July History of Present Illness:   This is a 50 year old male with a history of diarrhea beginning in early July described as watery, urgent with mucous and mild lower abdominal cramping. No blood noted. No travel, antibiotics or med changes. Recent bloodwork negative.   GI Review of Systems    Reports abdominal pain and  bloating.     Location of  Abdominal pain: lower abdomen.    Denies acid reflux, belching, chest pain, dysphagia with liquids, dysphagia with solids, heartburn, loss of appetite, nausea, vomiting, vomiting blood, weight loss, and  weight gain.      Reports change in bowel habits and  diarrhea.     Denies anal fissure, black tarry stools, constipation, diverticulosis, fecal incontinence, heme positive stool, hemorrhoids, irritable bowel syndrome, jaundice, light color stool, liver problems, rectal bleeding, and  rectal pain.   Current Medications (verified): 1)  Fish Oil   Oil (Fish Oil) .... Take 1 Tablet By Mouth Once A Day 2)  Zolpidem Tartrate 10 Mg Tabs (Zolpidem Tartrate) .Marland Kitchen.. 1 By Mouth At Bedtime As Needed 3)  Pramipexole Dihydrochloride 0.5 Mg Tabs (Pramipexole Dihydrochloride) .Marland Kitchen.. 1po At Bedtime For Rls 4)  Iron 325 (65 Fe) Mg Tabs (Ferrous Sulfate) .... Take 3-4 Times Weekly  Allergies (verified): No Known Drug Allergies  Past History:  Past Medical History: chronic insomnia OSA - mild RLS Depression  Past Surgical History: Reviewed history from 06/05/2008 and no changes required. Appendectomy-1997 Hernia-2000  Family History: Reviewed history from 06/05/2008 and no changes required. 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         The pertinent positives and negatives are noted as above and in the HPI. All other ROS were reviewed and were negative.   Vital Signs:  Patient profile:   50 year old male Height:      72 inches Weight:      204.38 pounds BMI:     27.82 Pulse rate:   80 / minute Pulse rhythm:   regular BP sitting:   128 / 92  (left arm) Cuff size:   regular  Vitals Entered By: June McMurray [place] Deborra Medina) (July 02, 2009 1:47 PM)  Physical Exam  General:  Well developed, well nourished, no acute distress. Head:  Normocephalic and atraumatic. Eyes:  PERRLA, no icterus. Ears:  Normal auditory acuity. Mouth:  No deformity or lesions, dentition normal. Neck:  Supple; no masses or thyromegaly. Lungs:  Clear throughout to auscultation. Heart:  Regular rate and rhythm; no murmurs, rubs,  or bruits. Abdomen:  Soft, nontender and nondistended. No masses, hepatosplenomegaly or hernias noted. Normal bowel sounds. Msk:  Symmetrical with no gross deformities. Normal posture. Pulses:  Normal pulses noted. Extremities:  No clubbing, cyanosis, edema or deformities noted. Neurologic:  Alert and  oriented x4;  grossly normal neurologically. Cervical Nodes:  No significant cervical adenopathy. Inguinal Nodes:  No significant inguinal adenopathy. Psych:  Alert  and cooperative. Normal mood and affect.   Impression & Recommendations:  Problem # 1:  DIARRHEA (ICD-787.91) R/O infectious causes or microscopic colitis. Less likely IBD or colorectal neoplasms. Stool hemocults. Trial of Cipro and Flagyl for 7 days. If symptoms not resolved will need stool studies and colonoscopy.  Problem # 2:  SCREENING  COLORECTAL-CANCER (ICD-V76.51) Colorectal cancer screening is due even if diarrhea and abdominal pain resolve.  Patient Instructions: 1)  Please pick up your medications at your pharmacy. 2)  Please return your Hemoccult cards in the envelope provided. 3)  Copy sent to : Cathlean Cower, MD 4)  The medication list was reviewed and reconciled.  All changed / newly prescribed medications were explained.  A complete medication list was provided to the patient / caregiver. 5)  Please schedule a follow-up appointment in 2 weeks.   Prescriptions: METRONIDAZOLE 500 MG TABS (METRONIDAZOLE) Take 1 tablet by mouth two times a day for 7 days  #14 x 0   Entered by:   Abelino Derrick CMA ([place])   Authorized by:   Ladene Artist MD [facility]   Signed by:   Abelino Derrick CMA ([place]) on 07/02/2009   Method used:   Electronically to        [facility]. [address] [phone]* (retail)       [address] [place], [place]  [address]       Ph: [phone] or [phone]       Fax: [phone]   RxID:   [id number] CIPROFLOXACIN HCL 500 MG TABS (CIPROFLOXACIN HCL) Take 1 tablet by mouth two times a day for 7 days  #14 x 0   Entered by:   Abelino Derrick CMA ([place])   Authorized by:   Ladene Artist MD [facility]   Signed by:   Abelino Derrick CMA ([place]) on 07/02/2009   Method used:   Electronically to        [facility]. [address] [phone]* (retail)       [address]       [place], [place]  [address]       Ph: [phone] or [phone]       Fax: [phone]   RxID:   [id number]

## 2010-12-08 NOTE — Procedures (Signed)
Summary: Recall / [facility]  Recall / [facility]   Imported By: Rise Patience 07/08/2010 10:52:07  _____________________________________________________________________  External Attachment:    Type:   Image     Comment:   External Document

## 2010-12-08 NOTE — Consult Note (Signed)
Summary: [facility]   Imported By: Edmonia James 05/01/2008 12:38:50  _____________________________________________________________________  External Attachment:    Type:   Image     Comment:   External Document

## 2010-12-08 NOTE — Progress Notes (Signed)
Summary: Pt ?  Phone Note Call from Patient Call back at Work Phone [phone]   Caller: Patient [phone] Summary of Call: pt called requesting refills on Ambien generic that was removed by Dr. Silvio Pate office (pt preference). Pt insists that it was removed in error and is requesting MD add to med list and refill. please advise Initial call taken by: Crissie Sickles, [place],  July 29, 2009 11:06 AM  Follow-up for Phone Call        there is no record of ambien rx or taking off the med list since oct 2008 per EMR; ok for short rx - done hardcopy to LIM side B - dahlia  Follow-up by: Biagio Borg MD,  July 29, 2009 1:27 PM  Additional Follow-up for Phone Call Additional follow up Details #1::        pt aware, rx in cabinet for pt pick up Additional Follow-up by: Crissie Sickles, CMA,  July 29, 2009 1:42 PM    New/Updated Medications: ZOLPIDEM TARTRATE 10 MG TABS (ZOLPIDEM TARTRATE) 1 by mouth at bedtime as needed Prescriptions: ZOLPIDEM TARTRATE 10 MG TABS (ZOLPIDEM TARTRATE) 1 by mouth at bedtime as needed  #30 x 2   Entered and Authorized by:   Biagio Borg MD   Signed by:   Biagio Borg MD on 07/29/2009   Method used:   Print then Give to Patient   RxID:   [id number]

## 2010-12-08 NOTE — Consult Note (Signed)
Summary: [facility]   Imported By: Edmonia James 05/01/2008 12:40:02  _____________________________________________________________________  External Attachment:    Type:   Image     Comment:   External Document

## 2010-12-08 NOTE — Progress Notes (Signed)
Summary: hem cards  Phone Note Outgoing Call   Call placed by: Christian Mate CMA Deborra Medina),  July 15, 2009 2:21 PM Summary of Call: Called pt and ask about hem cards.  His wife dropped them off this morning. Initial call taken by: Christian Mate CMA ([place]),  July 15, 2009 2:21 PM

## 2010-12-08 NOTE — Assessment & Plan Note (Signed)
Summary: FOLLOW UP DIARRHEA/SP   History of Present Illness Visit Type: follow up  Primary GI MD: Joylene Igo MD [facility] Primary Provider: Cathlean Cower, MD Requesting Provider: n/a Chief Complaint: F/u from office visit on 07-02-09 for lower abd pain and diarrhea. Pt states that he is doing better but some days will have lower abd pain and diarrhea History of Present Illness:   This is a return office visit for diarrhea. He completed a course of Cipro and Flagyl. He states his diarrhea is now intermittent. On some days he has normal bowel movements and some days he has several loose, non bloody stools per day. Stool Hemoccults were negative. He is very reluctant to proceed with colonoscopy.   GI Review of Systems      Denies abdominal pain, acid reflux, belching, bloating, chest pain, dysphagia with liquids, dysphagia with solids, heartburn, loss of appetite, nausea, vomiting, vomiting blood, weight loss, and  weight gain.        Denies anal fissure, black tarry stools, change in bowel habit, constipation, diarrhea, diverticulosis, fecal incontinence, heme positive stool, hemorrhoids, irritable bowel syndrome, jaundice, light color stool, liver problems, rectal bleeding, and  rectal pain. Current Medications (verified): 1)  Fish Oil   Oil (Fish Oil) .... Take 1 Tablet By Mouth Once A Day 2)  Pramipexole Dihydrochloride 0.5 Mg Tabs (Pramipexole Dihydrochloride) .Marland Kitchen.. 1po At Bedtime For Rls 3)  Iron 325 (65 Fe) Mg Tabs (Ferrous Sulfate) .... Take 3-4 Times Weekly  Allergies (verified): No Known Drug Allergies  Past History:  Past Medical History: Reviewed history from 07/02/2009 and no changes required. chronic insomnia OSA - mild RLS Depression  Past Surgical History: Reviewed history from 06/05/2008 and no changes required. Appendectomy-1997 Hernia-2000  Family History: Family History of Alcoholism/Addiction - father Family History of Arthritis - mother bilat hip  replacements Family History Diabetes - mother Family History Hypertension - both parents No FH of Colon Cancer:  Social History: Reviewed history from 07/02/2009 and no changes required. 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 All other ROS were reviewed and were negative.   Vital Signs:  Patient profile:   50 year old male Height:      72 inches Weight:      207 pounds BMI:     28.18 BSA:     2.16 Pulse rate:   76 / minute Pulse rhythm:   regular BP sitting:   132 / 80  (left arm) Cuff size:   regular  Vitals Entered By: Hope Pigeon CMA (July 16, 2009 1:20 PM)  Physical Exam  General:  Well developed, well nourished, no acute distress. Head:  Normocephalic and atraumatic. Mouth:  No deformity or lesions, dentition normal. Lungs:  Clear throughout to auscultation. Heart:  Regular rate and rhythm; no murmurs, rubs,  or bruits. Abdomen:  Soft, nontender and nondistended. No masses, hepatosplenomegaly or hernias noted. Normal bowel sounds. Psych:  Alert and cooperative. Normal mood and affect.  Impression & Recommendations:  Problem # 1:  DIARRHEA (ICD-787.91) Persistent but slightly improved diarrhea. I have recommended proceeding with colonoscopy, however he is very reluctant to proceed with colonoscopy. Possible iron side effects, hold iron for 2 weeks. Trial of a daily probiotic for one month. If his symptoms persist he states he will return for  colonoscopy. Orders: TLB-Sedimentation Rate (ESR) (85652-ESR) T-Sprue Panel (Celiac Disease Aby Eval) (83516x3/[address])  Patient Instructions: 1)  Samples of Align given today.  Take one daily.  You may buy more at your pharmacy. 2)  Please go to the basement today to have your blood drawn. 3)  The medication list was reviewed and  reconciled.  All changed / newly prescribed medications were explained.  A complete medication list was provided to the patient / caregiver. 4)  Copy sent to : Cathlean Cower, MD 5)  Please schedule a follow-up appointment in 2 months.

## 2010-12-08 NOTE — Letter (Signed)
Summary: Patient Notice-Endo Biopsy Results  [facility]  [place], [place] [address]   Phone: [phone]  Fax: [phone]        July 23, 2009 MRN: [id number]    Aaron [name] [address] [place], [place]  [address]    Dear Aaron [name],  I am pleased to inform you that the biopsies taken during your recent endoscopic examination did not show any evidence of cancer upon pathologic examination.   The biopsies showed gastritis and celiac disease.  Please call [phone] to schedule a return visit to review      your condition.  Continue with the treatment plan as outlined on the day of your      exam.  You should have a repeat endoscopic examination for this problem              in 1 year.  Please call us if you are having persistent problems or have questions about your condition that have not been fully answered at this time.  Sincerely,  Ladene Artist MD [facility]  This letter has been electronically signed by your physician.

## 2010-12-08 NOTE — Letter (Signed)
Summary: Med List/[facility]  Med List/[facility]   Imported By: Edmonia James 05/01/2008 12:51:57  _____________________________________________________________________  External Attachment:    Type:   Image     Comment:   External Document

## 2010-12-08 NOTE — Letter (Signed)
Summary: Office Visit Letter  [facility]  [address] [place], [place] [address]   Phone: [phone]  Fax: [phone]      January 19, 2010 MRN: [id number]   Aaron [name] [address] [place], [place]  [address]   Dear [name],   According to our records, it is time for you to schedule a follow-up office visit with [place].   At your convenience, please call [phone] (option #2)to schedule an office visit. If you have any questions, concerns, or feel that this letter is in error, we would appreciate your call.   Sincerely,  Norberto Sorenson T. Fuller Plan, M.D.  [facility] Gastroenterology Division [phone]

## 2010-12-08 NOTE — Miscellaneous (Signed)
Summary: colon [facility] 08-18-09 11:00/diarrhea 787.91/sheri previst  Clinical Lists Changes  Medications: Added new medication of MOVIPREP 100 GM  SOLR (PEG-KCL-NACL-NASULF-NA ASC-C) As per prep instructions. - Signed Rx of MOVIPREP 100 GM  SOLR (PEG-KCL-NACL-NASULF-NA ASC-C) As per prep instructions.;  #1 x 0;  Signed;  Entered by: Ritta Slot RN;  Authorized by: Ladene Artist MD [facility];  Method used: Electronically to [facility]. [place]. [phone]*, [id number] [place], [place], [place]  [address], Ph: [phone] or [phone], Fax: [phone] Observations: Added new observation of NKA: T (07/18/2009 12:46)    Prescriptions: MOVIPREP 100 GM  SOLR (PEG-KCL-NACL-NASULF-NA ASC-C) As per prep instructions.  #1 x 0   Entered by:   Ritta Slot RN   Authorized by:   Ladene Artist MD [facility]   Signed by:   Ritta Slot RN on 07/18/2009   Method used:   Electronically to        [facility]. [address] [phone]* (retail)       [address]       [place], [place]  [address]       Ph: [phone] or [phone]       Fax: [phone]   RxID:   [id number]

## 2011-05-26 ENCOUNTER — Telehealth: Payer: Self-pay

## 2011-05-26 NOTE — Telephone Encounter (Addendum)
Pt does not want to schedule an appt, he does not have insurance at this time

## 2012-07-12 ENCOUNTER — Encounter: Payer: Self-pay | Admitting: Gastroenterology

## 2013-07-20 ENCOUNTER — Ambulatory Visit (INDEPENDENT_AMBULATORY_CARE_PROVIDER_SITE_OTHER): Payer: BC Managed Care – PPO | Admitting: Family Medicine

## 2013-07-20 VITALS — BP 120/80 | HR 86 | Temp 98.5°F | Resp 16 | Ht 71.0 in | Wt 209.0 lb

## 2013-07-20 DIAGNOSIS — R5381 Other malaise: Secondary | ICD-10-CM

## 2013-07-20 DIAGNOSIS — G2581 Restless legs syndrome: Secondary | ICD-10-CM

## 2013-07-20 DIAGNOSIS — K900 Celiac disease: Secondary | ICD-10-CM

## 2013-07-20 DIAGNOSIS — Z0000 Encounter for general adult medical examination without abnormal findings: Secondary | ICD-10-CM

## 2013-07-20 DIAGNOSIS — G4730 Sleep apnea, unspecified: Secondary | ICD-10-CM

## 2013-07-20 LAB — POCT CBC
Granulocyte percent: 48.3 %G (ref 37–80)
HCT, POC: 45.4 % (ref 43.5–53.7)
Hemoglobin: 14.7 g/dL (ref 14.1–18.1)
Lymph, poc: 2.6 (ref 0.6–3.4)
MCH, POC: 30.9 pg (ref 27–31.2)
MCHC: 32.4 g/dL (ref 31.8–35.4)
MCV: 95.6 fL (ref 80–97)
MID (cbc): 0.4 (ref 0–0.9)
MPV: 7.7 fL (ref 0–99.8)
POC Granulocyte: 2.8 (ref 2–6.9)
POC LYMPH PERCENT: 44.1 %L (ref 10–50)
POC MID %: 7.6 %M (ref 0–12)
Platelet Count, POC: 267 10*3/uL (ref 142–424)
RBC: 4.75 M/uL (ref 4.69–6.13)
RDW, POC: 12.9 %
WBC: 5.8 10*3/uL (ref 4.6–10.2)

## 2013-07-20 LAB — TSH: TSH: 2.874 u[IU]/mL (ref 0.350–4.500)

## 2013-07-20 LAB — COMPREHENSIVE METABOLIC PANEL
ALT: 27 U/L (ref 0–53)
AST: 23 U/L (ref 0–37)
Albumin: 4.4 g/dL (ref 3.5–5.2)
Alkaline Phosphatase: 87 U/L (ref 39–117)
BUN: 15 mg/dL (ref 6–23)
CO2: 28 mEq/L (ref 19–32)
Calcium: 8.9 mg/dL (ref 8.4–10.5)
Chloride: 104 mEq/L (ref 96–112)
Creat: 1.16 mg/dL (ref 0.50–1.35)
Glucose, Bld: 94 mg/dL (ref 70–99)
Potassium: 4.4 mEq/L (ref 3.5–5.3)
Sodium: 139 mEq/L (ref 135–145)
Total Bilirubin: 0.7 mg/dL (ref 0.3–1.2)
Total Protein: 7.4 g/dL (ref 6.0–8.3)

## 2013-07-20 LAB — POCT UA - MICROSCOPIC ONLY
Bacteria, U Microscopic: NEGATIVE
Casts, Ur, LPF, POC: NEGATIVE
Crystals, Ur, HPF, POC: NEGATIVE
Mucus, UA: NEGATIVE
Yeast, UA: NEGATIVE

## 2013-07-20 LAB — POCT URINALYSIS DIPSTICK
Bilirubin, UA: NEGATIVE
Blood, UA: NEGATIVE
Glucose, UA: NEGATIVE
Ketones, UA: NEGATIVE
Leukocytes, UA: NEGATIVE
Nitrite, UA: NEGATIVE
Protein, UA: NEGATIVE
Spec Grav, UA: 1.015
Urobilinogen, UA: 0.2
pH, UA: 6.5

## 2013-07-20 LAB — IFOBT (OCCULT BLOOD): IFOBT: POSITIVE

## 2013-07-20 LAB — FERRITIN: Ferritin: 97 ng/mL (ref 22–322)

## 2013-07-20 LAB — LIPID PANEL
Cholesterol: 164 mg/dL (ref 0–200)
HDL: 34 mg/dL — ABNORMAL LOW (ref >39)
LDL Cholesterol: 89 mg/dL (ref 0–99)
Total CHOL/HDL Ratio: 4.8 Ratio
Triglycerides: 203 mg/dL — ABNORMAL HIGH (ref <150)
VLDL: 41 mg/dL — ABNORMAL HIGH (ref 0–40)

## 2013-07-20 LAB — PSA: PSA: 0.56 ng/mL (ref <=4.00)

## 2013-07-20 MED ORDER — GABAPENTIN 300 MG PO CAPS: 300.0000 mg | ORAL_CAPSULE | Freq: Three times a day (TID) | ORAL | Status: DC

## 2013-07-20 NOTE — Progress Notes (Signed)
Physical exam:  History: Patient is here for physical examination. I last saw him about 2 years ago for a physical, so he requested to see me today. He brought in a couple of pages discussing his history and problems and concerns. These will be scanned into the record. His concerns include celiac disease, with weight gain, gout, ADHD, sleep apnea, insomnia, restless leg syndrome. He had questions regarding motivation, activity, excessive urination, visible vein on chest wall, varicose veins, supplements, etc. He done extensive reading on ADHD, RLS, sleep apnea, and varicose veins.  Past medical history: Medical illnesses: Celiac disease Gout History of ADHD when young History of mild sleep apnea History of restless leg syndrome Surgical history: Appendectomy Herniorrhaphy Lasix   Current medications: None. He showed me a prescription for Neurontin given him by a neurologist couple of years ago but he never filled. Drug allergies: None known to  Family history: Parents are deceased. Siblings are living and well. Has 3 children living and well.  Social history: He inherited some well and invests it, but has not worked for a number of years. He exercises regularly running or swimming. He is married. He is focused on his children until they're now growing. He does not smoke. He drinks about 10 alcoholic drinks, usually vodka and juice, per week. No drug use. Does minimal volunteering. Spends a lot of time on the computer researching things. He is college educated.  Review of systems: Constitutional: Fatigue HEENT: Unremarkable Respiratory: Unremarkable Cardiovascular: Unremarkable Gastrointestinal: Celiac disease as not to be bothering him much: Genitourinary: Has some nocturia but he thinks his 20s his restless legs he urinates. Musculoskeletal: Restless leg syndrome as noted above Dermatologic: Unremarkable except for you can see a vein on his right chest is concerned about that.  Question about varicose veins in his legs. Neurologic: No major symptoms other than the history of sleep apnea which was mild Hematologic: Unremarkable Psychiatric: History of being diagnosed as ADHD and he wonders whether he needs medications again now at this age.  Endocrine: Unremarkable  Physical examination: Well-developed well-nourished man in no major acute distress. TMs normal. Eyes PERR LA. Fundi normal. Throat clear. Neck supple without nodes thyromegaly. No carotid bruits. Chest is clear to auscultation. Heart regular without murmurs gallops or arrhythmias. Mild blue veins as well as right chest wall, fairly insignificant looking. Her abdomen was somewhat full without organomegaly mass or tenderness. No hepatosplenomegaly. Normal external genitalia with no hernias. Digital rectal exam normal. Occult blood tested. Extremities have good pulses. Skin unremarkable. No significant or constraints of his legs. No rashes neurologic grossly intact. He reflexes slightly brisk on the left than the right.  Assessment: Physical Exam Restless Leg Syndrome History of sleep apnea History of celiac disease Fatigue     Results for orders placed in visit on 07/20/13  IFOBT (OCCULT BLOOD)      Result Value Range   IFOBT Positive    POCT CBC      Result Value Range   WBC 5.8  4.6 - 10.2 K/uL   Lymph, poc 2.6  0.6 - 3.4   POC LYMPH PERCENT 44.1  10 - 50 %L   MID (cbc) 0.4  0 - 0.9   POC MID % 7.6  0 - 12 %M   POC Granulocyte 2.8  2 - 6.9   Granulocyte percent 48.3  37 - 80 %G   RBC 4.75  4.69 - 6.13 M/uL   Hemoglobin 14.7  14.1 - 18.1 g/dL  HCT, POC 45.4  43.5 - 53.7 %   MCV 95.6  80 - 97 fL   MCH, POC 30.9  27 - 31.2 pg   MCHC 32.4  31.8 - 35.4 g/dL   RDW, POC 12.9     Platelet Count, POC 267  142 - 424 K/uL   MPV 7.7  0 - 99.8 fL  POCT UA - MICROSCOPIC ONLY      Result Value Range   WBC, Ur, HPF, POC 1-3     RBC, urine, microscopic 0-1     Bacteria, U Microscopic neg     Mucus,  UA neg     Epithelial cells, urine per micros 0-2     Crystals, Ur, HPF, POC neg     Casts, Ur, LPF, POC neg     Yeast, UA neg    POCT URINALYSIS DIPSTICK      Result Value Range   Color, UA yellow     Clarity, UA clear     Glucose, UA neg     Bilirubin, UA neg     Ketones, UA neg     Spec Grav, UA 1.015     Blood, UA neg     pH, UA 6.5     Protein, UA neg     Urobilinogen, UA 0.2     Nitrite, UA neg     Leukocytes, UA Negative     Discussed the above with the patient.  He has researched a lot of things out. I told him we need to send him to a neurologist evaluate him for his sleep apnea, restless leg syndrome, and how that relates to his fatigue and celiac disease can be discussed. I cannot answer all of that. We'll see what his labs come back. He is quite concerned it is iron might be causing restless legs, and he takes iron every day or 2.  it does not seem to have helped a lot. He also asked about the ADHD medication question. I am not a big fan of ADHD meds in older people. He will research which neurologist he wants, and we can make a referral for him.  With the Hemoccult-positive I think he does need to go back to his gastroenterologist for colonoscopy. He should be able to self refer himself there.  I did go ahead and prescribed Neurontin 300 mg one daily for the restless legs since he never got the prescription filled that other doctors have given him.

## 2013-07-24 ENCOUNTER — Other Ambulatory Visit: Payer: Self-pay

## 2013-07-24 DIAGNOSIS — G2581 Restless legs syndrome: Secondary | ICD-10-CM

## 2013-07-24 MED ORDER — GABAPENTIN 300 MG PO CAPS: ORAL_CAPSULE | ORAL | Status: DC

## 2013-07-24 MED ORDER — GABAPENTIN 300 MG PO CAPS: 300.0000 mg | ORAL_CAPSULE | Freq: Three times a day (TID) | ORAL | Status: DC

## 2013-07-26 ENCOUNTER — Telehealth: Payer: Self-pay

## 2013-07-26 DIAGNOSIS — G2581 Restless legs syndrome: Secondary | ICD-10-CM

## 2013-07-26 DIAGNOSIS — R5381 Other malaise: Secondary | ICD-10-CM

## 2013-07-26 DIAGNOSIS — G4730 Sleep apnea, unspecified: Secondary | ICD-10-CM

## 2013-07-26 NOTE — Telephone Encounter (Signed)
Referral should be with Neurology, per Dr Darrol Angel note, have put the referral in, called patient. What are his questions? He is asking about labs. Reviewed these with him. He will work on low fat/ low sugar food choices, for his elevated triglycerides. He is advised other labs normal. He does want the sleep study this is ordered also. To you FYI

## 2013-07-26 NOTE — Telephone Encounter (Signed)
Pt wants to talk with dr hopper about his urologist referral and has a few other questions  Best number [phone]

## 2013-07-29 ENCOUNTER — Encounter: Payer: Self-pay | Admitting: Family Medicine

## 2013-07-31 NOTE — Telephone Encounter (Signed)
noted 

## 2013-08-02 ENCOUNTER — Encounter: Payer: Self-pay | Admitting: Neurology

## 2013-08-02 ENCOUNTER — Ambulatory Visit (INDEPENDENT_AMBULATORY_CARE_PROVIDER_SITE_OTHER): Payer: BC Managed Care – PPO | Admitting: Neurology

## 2013-08-02 VITALS — BP 128/86 | HR 57 | Temp 97.8°F | Resp 16 | Ht 71.0 in

## 2013-08-02 DIAGNOSIS — F909 Attention-deficit hyperactivity disorder, unspecified type: Secondary | ICD-10-CM

## 2013-08-02 DIAGNOSIS — G2581 Restless legs syndrome: Secondary | ICD-10-CM | POA: Insufficient documentation

## 2013-08-02 DIAGNOSIS — F908 Attention-deficit hyperactivity disorder, other type: Secondary | ICD-10-CM

## 2013-08-02 DIAGNOSIS — R0989 Other specified symptoms and signs involving the circulatory and respiratory systems: Secondary | ICD-10-CM

## 2013-08-02 DIAGNOSIS — R0609 Other forms of dyspnea: Secondary | ICD-10-CM

## 2013-08-02 DIAGNOSIS — G4730 Sleep apnea, unspecified: Secondary | ICD-10-CM | POA: Insufficient documentation

## 2013-08-02 DIAGNOSIS — R0683 Snoring: Secondary | ICD-10-CM

## 2013-08-02 MED ORDER — AMPHETAMINE-DEXTROAMPHETAMINE 10 MG PO TABS: 10.0000 mg | ORAL_TABLET | Freq: Two times a day (BID) | ORAL | Status: DC

## 2013-08-02 NOTE — Patient Instructions (Signed)
Restless Legs Syndrome Restless legs syndrome is a movement disorder. It may also be called a sensori-motor disorder.  CAUSES  No one knows what specifically causes restless legs syndrome, but it tends to run in families. It is also more common in people with low iron, in pregnancy, in people who need dialysis, and those with nerve damage (neuropathy).Some medications may make restless legs syndrome worse.Those medications include drugs to treat high blood pressure, some heart conditions, nausea, colds, allergies, and depression. SYMPTOMS Symptoms include uncomfortable sensations in the legs. These leg sensations are worse during periods of inactivity or rest. They are also worse while sitting or lying down. Individuals that have the disorder describe sensations in the legs that feel like:  Pulling.  Drawing.  Crawling.  Worming.  Boring.  Tingling.  Pins and needles.  Prickling.  Pain. The sensations are usually accompanied by an overwhelming urge to move the legs. Sudden muscle jerks may also occur. Movement provides temporary relief from the discomfort. In rare cases, the arms may also be affected. Symptoms may interfere with going to sleep (sleep onset insomnia). Restless legs syndrome may also be related to periodic limb movement disorder (PLMD). PLMD is another more common motor disorder. It also causes interrupted sleep. The symptoms from PLMD usually occur most often when you are awake. TREATMENT  Treatment for restless legs syndrome is symptomatic. This means that the symptoms are treated.   Massage and cold compresses may provide temporary relief.  Walk, stretch, or take a cold or hot bath.  Get regular exercise and a good night's sleep.  Avoid caffeine, alcohol, nicotine, and medications that can make it worse.  Do activities that provide mental stimulation like discussions, needlework, and video games. These may be helpful if you are not able to walk or  stretch. Some medications are effective in relieving the symptoms. However, many of these medications have side effects. Ask your caregiver about medications that may help your symptoms. Correcting iron deficiency may improve symptoms for some patients. Document Released: 10/15/2002 Document Revised: 01/17/2012 Document Reviewed: 01/21/2011 [facility] Patient Information 2014 [place]. Attention Deficit Hyperactivity Disorder Attention deficit hyperactivity disorder (ADHD) is a problem with behavior issues based on the way the brain functions (neurobehavioral disorder). It is a common reason for behavior and academic problems in school. CAUSES  The cause of ADHD is unknown in most cases. It may run in families. It sometimes can be associated with learning disabilities and other behavioral problems. SYMPTOMS  There are 3 types of ADHD. The 3 types and some of the symptoms include:  Inattentive  Gets bored or distracted easily.  Loses or forgets things. Forgets to hand in homework.  Has trouble organizing or completing tasks.  Difficulty staying on task.  An inability to organize daily tasks and school work.  Leaving projects, chores, or homework unfinished.  Trouble paying attention or responding to details. Careless mistakes.  Difficulty following directions. Often seems like is not listening.  Dislikes activities that require sustained attention (like chores or homework).  Hyperactive-impulsive  Feels like it is impossible to sit still or stay in a seat. Fidgeting with hands and feet.  Trouble waiting turn.  Talking too much or out of turn. Interruptive.  Speaks or acts impulsively.  Aggressive, disruptive behavior.  Constantly busy or on the go, noisy.  Combined  Has symptoms of both of the above. Often children with ADHD feel discouraged about themselves and with school. They often perform well below their abilities in school.  These symptoms can cause problems  in home, school, and in relationships with peers. As children get older, the excess motor activities can calm down, but the problems with paying attention and staying organized persist. Most children do not outgrow ADHD but with good treatment can learn to cope with the symptoms. DIAGNOSIS  When ADHD is suspected, the diagnosis should be made by professionals trained in ADHD.  Diagnosis will include:  Ruling out other reasons for the child's behavior.  The caregivers will check with the child's school and check their medical records.  They will talk to teachers and parents.  Behavior rating scales for the child will be filled out by those dealing with the child on a daily basis. A diagnosis is made only after all information has been considered. TREATMENT  Treatment usually includes behavioral treatment often along with medicines. It may include stimulant medicines. The stimulant medicines decrease impulsivity and hyperactivity and increase attention. Other medicines used include antidepressants and certain blood pressure medicines. Most experts agree that treatment for ADHD should address all aspects of the child's functioning. Treatment should not be limited to the use of medicines alone. Treatment should include structured classroom management. The parents must receive education to address rewarding good behavior, discipline, and limit-setting. Tutoring or behavioral therapy or both should be available for the child. If untreated, the disorder can have long-term serious effects into adolescence and adulthood. HOME CARE INSTRUCTIONS   Often with ADHD there is a lot of frustration among the family in dealing with the illness. There is often blame and anger that is not warranted. This is a life long illness. There is no way to prevent ADHD. In many cases, because the problem affects the family as a whole, the entire family may need help. A therapist can help the family find better ways to handle  the disruptive behaviors and promote change. If the child is young, most of the therapist's work is with the parents. Parents will learn techniques for coping with and improving their child's behavior. Sometimes only the child with the ADHD needs counseling. Your caregivers can help you make these decisions.  Children with ADHD may need help in organizing. Some helpful tips include:  Keep routines the same every day from wake-up time to bedtime. Schedule everything. This includes homework and playtime. This should include outdoor and indoor recreation. Keep the schedule on the refrigerator or a bulletin board where it is frequently seen. Mark schedule changes as far in advance as possible.  Have a place for everything and keep everything in its place. This includes clothing, backpacks, and school supplies.  Encourage writing down assignments and bringing home needed books.  Offer your child a well-balanced diet. Breakfast is especially important for school performance. Children should avoid drinks with caffeine including:  Soft drinks.  Coffee.  Tea.  However, some older children (adolescents) may find these drinks helpful in improving their attention.  Children with ADHD need consistent rules that they can understand and follow. If rules are followed, give small rewards. Children with ADHD often receive, and expect, criticism. Look for good behavior and praise it. Set realistic goals. Give clear instructions. Look for activities that can foster success and self-esteem. Make time for pleasant activities with your child. Give lots of affection.  Parents are their children's greatest advocates. Learn as much as possible about ADHD. This helps you become a stronger and better advocate for your child. It also helps you educate your child's teachers and instructors if they  feel inadequate in these areas. Parent support groups are often helpful. A national group with local chapters is called CHADD  (Children and Adults with Attention Deficit Hyperactivity Disorder). PROGNOSIS  There is no cure for ADHD. Children with the disorder seldom outgrow it. Many find adaptive ways to accommodate the ADHD as they mature. SEEK MEDICAL CARE IF:  Your child has repeated muscle twitches, cough or speech outbursts.  Your child has sleep problems.  Your child has a marked loss of appetite.  Your child develops depression.  Your child has new or worsening behavioral problems.  Your child develops dizziness.  Your child has a racing heart.  Your child has stomach pains.  Your child develops headaches. Document Released: 10/15/2002 Document Revised: 01/17/2012 Document Reviewed: 05/27/2008 [facility] Patient Information 2014 [place].

## 2013-08-02 NOTE — Progress Notes (Signed)
[facility]  Provider:  Larey Seat, M D  Referring Provider: Biagio Borg, MD Primary Care Physician:  Dr Unice Cobble.   Chief Complaint  Patient presents with  . Follow-up    RLS , Sleep apnea,.Dr. Linna Darner  . Neurologic Problem    HPI:  Aaron [name] is a 52 y.o. male  Is seen here as a referral/ revisit  from Dr. Linna Darner, for sleep disorder.   Patient with mild sleep apnea , not on CPAP, and restless legs, and a history of AD HD. He had for years struggled to fall asleep, and had negative reaction Mirapex. Aaron [name] and goals today the Epworth sleepiness score at 12 points, fatigue severity score at 44 points and the [facility] restless leg questionnaire in a medium impairment range he states that he has trouble concentrating in the afternoon most of the time, most of the time trouble concentrating in the evening, that he has some problems to initiate sleep, and that he feels that in the morning- he often craves more sleep . The restless legs are quite a bit distressing to him. RLS begun 10 years ago  progressive impairment.  The patient had presented to an RLS specific trial and the colleagues prescribed 300 mg of gabapentin under the brand name normal on 10 to be taken at night for him. Due to lack of insurance at the time he could not introduce the medication but he has started about 2 or 3 weeks ago and feels that it has given him some relief. I have also his recent laboratory results. Visual normal sodium and potassium levels, normal liver function tests and renal clearance. He has a slightly lower than perfect HDL and he has increased triglycerides. He has gout and celiac disease. He had absorption problems with Iron and takes now a supplement.   He has an active lifestyle , he drinks 10 alcoholic beverages a week, he has regular sleep habits.  The patient's the patient will keep an average bedtime between 10 and 11 PM rarely but sometimes he will fall  asleep already before watching TV.  His wife and he share a King size bed, quiet bedroom, cool, and dark. He will almost regularly wake up at about 2 AM after 3-4 hours of sleep , and continues to spend the night often another room watching some TV until he can fall asleep again. The total sleep time overnight is estimated at 4-5 hours , rarely 6 . He drinks caffeine in AM 2-3 cups,  Nothing after noon.  Wide reports him kicking a lot, sleep talking and sleep walking denied, but snoring ;yes, No REM BD symptoms.  dry eyes in AM not dry mouth.   No spinal injuries, no trauma not surgery to airways, facial skull.  Family history of insomnia, diabetes and obesity.    Review of Systems: Out of a complete 14 system review, the patient complains of only the following symptoms, and all other reviewed systems are negative. Insomia, RLS  Creepy crawly , needs to walk.   History   Social History  . Marital Status: Married    Spouse Name: N/A    Number of Children: 3  . Years of Education: N/A   Occupational History  . Not on file.   Social History Main Topics  . Smoking status: Never Smoker   . Smokeless tobacco: Not on file  . Alcohol Use: Yes  . Drug Use: No  . Sexual Activity: Yes  Other Topics Concern  . Not on file   Social History Narrative  . No narrative on file    Family History  Problem Relation Age of Onset  . Hypertension Mother   . Diabetes Mother     Past Medical History  Diagnosis Date  . Sleep apnea syndrome     tested  under [place] [facility],  12-2-- 2004  . RLS (restless legs syndrome)     Past Surgical History  Procedure Laterality Date  . Appendectomy    . Hernia repair      Current Outpatient Prescriptions  Medication Sig Dispense Refill  . ferrous sulfate 325 (65 FE) MG tablet Take 325 mg by mouth daily with breakfast.      . fish oil-omega-3 fatty acids 1000 MG capsule Take 2 g by mouth daily.      Marland Kitchen gabapentin (NEURONTIN) 300 MG  capsule Take 300 mg by mouth at bedtime.       No current facility-administered medications for this visit.    Allergies as of 08/02/2013  . (No Known Allergies)    Vitals: BP 128/86  Pulse 57  Temp(Src) 97.8 F (36.6 C) (Oral)  Resp 16  Ht 5' 11"  (1.803 m) Last Weight:  Wt Readings from Last 1 Encounters:  07/20/13 209 lb (94.802 kg)   Last Height:   Ht Readings from Last 1 Encounters:  08/02/13 5' 11"  (1.803 m)    Physical exam:  General: The patient is awake, alert and appears not in acute distress. The patient is well groomed. Head: Normocephalic, atraumatic. Neck is supple. Mallampati 2 , neck circumference: 14.5 , no retrognathia.  Slight deviation of the nasal septum.  Cardiovascular:  Regular rate and rhythm , without  murmurs or carotid bruit, and without distended neck veins. Respiratory: Lungs are clear to auscultation. Skin:  Without evidence of edema, or rash Trunk: BMI is elevated ,patient  has normal posture.  Neurologic exam : The patient is awake and alert, oriented to place and time.  Memory subjective   described as intact. There is a normal attention span & concentration ability. Speech is fluent without  dysarthria, dysphonia or aphasia.  Mood and affect are appropriate.  Cranial nerves: Pupils are equal and briskly reactive to light. Funduscopic exam without  evidence of pallor or edema. Extraocular movements  in vertical and horizontal planes intact and without nystagmus. Visual fields by finger perimetry are intact. Hearing to finger rub intact.  Facial sensation intact to fine touch. Facial motor strength is symmetric and tongue and uvula move midline.  Motor exam:   Normal tone and normal muscle bulk and symmetric normal strength in all extremities. The patient has increased tone  over the right shoulder, football injury. No cog wheeling.    Sensory:  Fine touch, pinprick and vibration were tested in all extremities. Proprioception is  normal.  Coordination: Rapid alternating movements in the fingers/hands is tested and normal. Finger-to-nose maneuver tested without evidence of ataxia, dysmetria or tremor.  Gait and station: Patient walks without assistive device. Strength within normal limits.      Assessment; impulsive , inattentive, pressured speech. Was on meds at age 77. Unknown medication. I am concerned and treating the probably present ADHD adult residual form could worsen his restless leg symptoms. The patient had restless legs for over 10 years it on Leal the last 1 or 2 years has he become increasingly impaired and his sleep quality had decreased. He just 2 weeks ago  started gabapentin and he has noted an improvement. He has continued to use over-the-counter iron supplements and his ferritin level was around 90. He has no history of spinal stenosis, lower back surgeries or trauma. He has no history of back pain either, he is not hyperreflexic. In evaluating the patient's sleep apnea would allow me to look also at the periodic limb movements. In some patients a nocturnal kicking is related to a low oxygen level and meant to a rales the patient had a deep sleep and allow for the next breath to be taken. However the symptoms that the patient describes RRLS classic symptoms occurring at rest and not just at night.  PLAN:   I recommend to continue the gabapentin as it is relatively harmless not addictive and since he doesn't use it in daytime not contribute to daytime sleepiness. A trial of a stimulant for daytime use will be offered to see if the ADHD component is responsive. Inattentiveness impulsivity can also be related to poor sleep at night. The patient is claustrophobic , he is highly anxious. An in-lab study may be difficult. Overnight Pulse oximetry .

## 2013-08-03 ENCOUNTER — Encounter: Payer: Self-pay | Admitting: Neurology

## 2013-08-09 ENCOUNTER — Encounter: Payer: Self-pay | Admitting: Gastroenterology

## 2013-08-09 NOTE — Telephone Encounter (Signed)
error 

## 2013-08-17 ENCOUNTER — Encounter: Payer: Self-pay | Admitting: Neurology

## 2013-08-21 ENCOUNTER — Telehealth: Payer: Self-pay

## 2013-08-21 DIAGNOSIS — K900 Celiac disease: Secondary | ICD-10-CM

## 2013-08-21 DIAGNOSIS — K921 Melena: Secondary | ICD-10-CM

## 2013-08-21 NOTE — Telephone Encounter (Signed)
Have signed referral to GI due to Dr. Clayborn Heron rec and +hemoccult.  Why is pt requesting bone density screen?  It is not recommended in men unless they have clinical manifestations of low bone mass, such as radiographic osteopenia, history of low trauma fractures, and loss of more than 1.5 inches in height, as well as in those with risk factors for fracture, such as long-term glucocorticoid therapy, androgen deprivation therapy for prostate cancer, hypogonadism

## 2013-08-21 NOTE — Telephone Encounter (Signed)
Dr hopper   Patient is requesting a referral to 1) [facility] for colonoscopy - they told him he was not due, you advised him to have one due to blood in stool   2) requesting a bone density test also.   [phone]

## 2013-08-21 NOTE — Telephone Encounter (Signed)
Pended GI referral please advise on the bone density request.

## 2013-08-22 NOTE — Telephone Encounter (Signed)
Spoke to patient , he indicates he has celiac disease. He indicates Dr Fuller Plan has suggested a bone density test for him.

## 2013-08-22 NOTE — Telephone Encounter (Signed)
Order placed for bone density  

## 2013-08-24 ENCOUNTER — Telehealth: Payer: Self-pay | Admitting: Gastroenterology

## 2013-08-27 NOTE — Telephone Encounter (Signed)
Patient is asking if he needs to repeat colon.  He had a positive IFOB drawn by Dr. Linna Darner.  Last colon was in 2010 and he is scheduled for a recall for 2020.  He is scheduled for a follow up EGD 10/23/13.  He wants to know if he needs to do a colon at the same time.  He has no GI complaints or change in bowel habits.  He is not wanting the colonoscopy, it was recommended by Dr., Linna Darner.  Please advise

## 2013-08-27 NOTE — Telephone Encounter (Signed)
We generally advise not to do hemoccults for screening if you had a colonoscopy within 5 years. Since it is now 4+ years since his colonoscopy I would recommend doing a colonoscopy for heme +'stool.

## 2013-08-27 NOTE — Telephone Encounter (Signed)
Left message for patient to call back  

## 2013-08-28 NOTE — Telephone Encounter (Signed)
Patient advised Colon/endo scheduled for 10/23/13 2:30 and rescheduled pre-visit for 10/01/13 3:30

## 2013-08-30 ENCOUNTER — Other Ambulatory Visit: Payer: Self-pay | Admitting: Neurology

## 2013-08-30 DIAGNOSIS — R0683 Snoring: Secondary | ICD-10-CM

## 2013-08-30 DIAGNOSIS — F908 Attention-deficit hyperactivity disorder, other type: Secondary | ICD-10-CM

## 2013-08-30 DIAGNOSIS — G2581 Restless legs syndrome: Secondary | ICD-10-CM

## 2013-08-30 MED ORDER — AMPHETAMINE-DEXTROAMPHETAMINE 10 MG PO TABS: 10.0000 mg | ORAL_TABLET | Freq: Every day | ORAL | Status: DC

## 2013-08-30 NOTE — Telephone Encounter (Signed)
Pt was prescribed Adderall during his last visit.  It was a 30 day supply and he is nearly out.  He is not scheduled for a follow up appointment for another 2 weeks so he is requesting a refill of this medication.

## 2013-08-31 ENCOUNTER — Telehealth: Payer: Self-pay | Admitting: Neurology

## 2013-08-31 DIAGNOSIS — G2581 Restless legs syndrome: Secondary | ICD-10-CM

## 2013-08-31 DIAGNOSIS — F908 Attention-deficit hyperactivity disorder, other type: Secondary | ICD-10-CM

## 2013-08-31 DIAGNOSIS — R0683 Snoring: Secondary | ICD-10-CM

## 2013-08-31 MED ORDER — AMPHETAMINE-DEXTROAMPHETAMINE 10 MG PO TABS: 10.0000 mg | ORAL_TABLET | Freq: Every day | ORAL | Status: DC

## 2013-08-31 NOTE — Telephone Encounter (Signed)
Refill adderall at 20 mg ( 2 tabs of 10 mg)

## 2013-08-31 NOTE — Telephone Encounter (Signed)
URGENT, STAT REQUEST!!  THIS RX CANNOT BE FAXED OR SUBMITTED ELECTRONICALLY - PT MUST OBTAIN A HARD COPY AND PERSONALLY TAKE IT TO THE PHARMACY.  PLEASE PREPARE A RX SO HE CAN COME TO THE OFFICE TO PICK UP TODAY!

## 2013-08-31 NOTE — Telephone Encounter (Signed)
Refill request

## 2013-09-03 ENCOUNTER — Other Ambulatory Visit: Payer: Self-pay

## 2013-09-03 DIAGNOSIS — F908 Attention-deficit hyperactivity disorder, other type: Secondary | ICD-10-CM

## 2013-09-03 DIAGNOSIS — G2581 Restless legs syndrome: Secondary | ICD-10-CM

## 2013-09-03 DIAGNOSIS — R0683 Snoring: Secondary | ICD-10-CM

## 2013-09-03 MED ORDER — AMPHETAMINE-DEXTROAMPHETAMINE 10 MG PO TABS: 10.0000 mg | ORAL_TABLET | Freq: Two times a day (BID) | ORAL | Status: DC

## 2013-09-06 ENCOUNTER — Telehealth: Payer: Self-pay | Admitting: *Deleted

## 2013-09-12 ENCOUNTER — Telehealth: Payer: Self-pay | Admitting: *Deleted

## 2013-09-12 NOTE — Telephone Encounter (Signed)
Called patient to cancel appt due to Dr. Ellwood Sayers being out of the office and cm has never seen the patient. The patient did not answer but a message was left stating that the appt was canceled and he needs to call the office back to r/s.

## 2013-09-13 ENCOUNTER — Ambulatory Visit: Payer: BC Managed Care – PPO | Admitting: Nurse Practitioner

## 2013-09-21 ENCOUNTER — Other Ambulatory Visit: Payer: Self-pay

## 2013-09-21 MED ORDER — GABAPENTIN 300 MG PO CAPS: 300.0000 mg | ORAL_CAPSULE | Freq: Every day | ORAL | Status: DC

## 2013-09-21 NOTE — Telephone Encounter (Signed)
Please let us know how ou are doing on your adderall. Also I have reordered your gabapentin.

## 2013-10-02 ENCOUNTER — Ambulatory Visit (AMBULATORY_SURGERY_CENTER): Payer: Self-pay | Admitting: *Deleted

## 2013-10-02 VITALS — Ht 72.0 in | Wt 217.6 lb

## 2013-10-02 DIAGNOSIS — K921 Melena: Secondary | ICD-10-CM

## 2013-10-02 DIAGNOSIS — M359 Systemic involvement of connective tissue, unspecified: Secondary | ICD-10-CM

## 2013-10-02 MED ORDER — MOVIPREP 100 G PO SOLR: ORAL | Status: DC

## 2013-10-02 NOTE — Progress Notes (Signed)
No allergies to eggs or soy. No problems with anesthesia.

## 2013-10-03 ENCOUNTER — Encounter: Payer: Self-pay | Admitting: Nurse Practitioner

## 2013-10-03 ENCOUNTER — Encounter: Payer: Self-pay | Admitting: Gastroenterology

## 2013-10-03 ENCOUNTER — Ambulatory Visit (INDEPENDENT_AMBULATORY_CARE_PROVIDER_SITE_OTHER): Payer: BC Managed Care – PPO | Admitting: Nurse Practitioner

## 2013-10-03 ENCOUNTER — Encounter (INDEPENDENT_AMBULATORY_CARE_PROVIDER_SITE_OTHER): Payer: Self-pay

## 2013-10-03 VITALS — BP 124/74 | HR 54 | Ht 71.5 in | Wt 218.0 lb

## 2013-10-03 DIAGNOSIS — R0609 Other forms of dyspnea: Secondary | ICD-10-CM

## 2013-10-03 DIAGNOSIS — G2581 Restless legs syndrome: Secondary | ICD-10-CM

## 2013-10-03 DIAGNOSIS — F908 Attention-deficit hyperactivity disorder, other type: Secondary | ICD-10-CM

## 2013-10-03 DIAGNOSIS — G4730 Sleep apnea, unspecified: Secondary | ICD-10-CM

## 2013-10-03 DIAGNOSIS — R0989 Other specified symptoms and signs involving the circulatory and respiratory systems: Secondary | ICD-10-CM

## 2013-10-03 DIAGNOSIS — F909 Attention-deficit hyperactivity disorder, unspecified type: Secondary | ICD-10-CM

## 2013-10-03 DIAGNOSIS — R0683 Snoring: Secondary | ICD-10-CM

## 2013-10-03 MED ORDER — AMPHETAMINE-DEXTROAMPHETAMINE 10 MG PO TABS: 10.0000 mg | ORAL_TABLET | Freq: Two times a day (BID) | ORAL | Status: DC

## 2013-10-03 NOTE — Patient Instructions (Addendum)
Continue Adderall for now as directed Rx given to patient Continue gabapentin for restless leg syndrome Will set up for sleep test Followup in 4 months

## 2013-10-03 NOTE — Progress Notes (Signed)
[facility]  PATIENT: Aaron [name] DOB: [birth year]   REASON FOR VISIT: followup for RLS, daytime drowsiness     HISTORY OF PRESENT ILLNESS:Mr [name], 52 year old male returns for followup he was last seen in this office in initially evaluated by [name] 08/02/2013. He had a sleep study 10 years ago and was told he had mild obstructive sleep apnea at that time and restless legs. He continues to complain of daytime drowsiness but this is better with Adderall. He continues to have problems sleeping at night. He had overnight pulse oxymetry. His O2 sat only dropped below 90 for 1.min and 24 seconds, normal. Patient is asking about having another sleep study.  HISTORY: Patient with mild sleep apnea , not on CPAP, and restless legs, and a history of AD HD. He had for years struggled to fall asleep, and had negative reaction Mirapex.  Mr. Carillo and goals today the Epworth sleepiness score at 12 points, fatigue severity score at 44 points and the [facility] restless leg questionnaire in a medium impairment range he states that he has trouble concentrating in the afternoon most of the time, most of the time trouble concentrating in the evening, that he has some problems to initiate sleep, and that he feels that in the morning- he often craves more sleep . The restless legs are quite a bit distressing to him.  RLS begun 10 years ago progressive impairment.  The patient had presented to an RLS specific trial and the colleagues prescribed 300 mg of gabapentin under the brand name normal on 10 to be taken at night for him. Due to lack of insurance at the time he could not introduce the medication but he has started about 2 or 3 weeks ago and feels that it has given him some relief. I have also his recent laboratory results. Visual normal sodium and potassium levels, normal liver function tests and renal clearance. He has a slightly lower than perfect HDL and he has increased  triglycerides. He has gout and celiac disease. He had absorption problems with Iron and takes now a supplement.  He has an active lifestyle , he drinks 10 alcoholic beverages a week, he has regular sleep habits.  The patient's the patient will keep an average bedtime between 10 and 11 PM rarely but sometimes he will fall asleep already before watching TV.  His wife and he share a King size bed, quiet bedroom, cool, and dark. He will almost regularly wake up at about 2 AM after 3-4 hours of sleep , and continues to spend the night often another room watching some TV until he can fall asleep again. The total sleep time overnight is estimated at 4-5 hours , rarely 6 .  He drinks caffeine in AM 2-3 cups, Nothing after noon.  Wide reports him kicking a lot, sleep talking and sleep walking denied, but snoring ;yes, No REM BD symptoms.  dry eyes in AM not dry mouth.    REVIEW OF SYSTEMS: Full 14 system review of systems performed and notable only for: all negative Constitutional: N/A  Cardiovascular: N/A  Ear/Nose/Throat: N/A  Skin: N/A  Eyes: N/A  Respiratory: N/A  Gastroitestinal: N/A  Hematology/Lymphatic: N/A  Endocrine: N/A Musculoskeletal:N/A  Allergy/Immunology: N/A  Neurological: N/A Psychiatric: N/A   ALLERGIES: No Known Allergies  HOME MEDICATIONS: Outpatient Prescriptions Prior to Visit  Medication Sig Dispense Refill  . amphetamine-dextroamphetamine (ADDERALL) 10 MG tablet Take 1 tablet (10 mg total) by mouth 2 (two) times  daily. Take 2 a day in AM po.  60 tablet  0  . ferrous sulfate 325 (65 FE) MG tablet Take 325 mg by mouth daily with breakfast.      . fish oil-omega-3 fatty acids 1000 MG capsule Take 2 g by mouth daily.      Marland Kitchen gabapentin (NEURONTIN) 300 MG capsule Take 1 capsule (300 mg total) by mouth at bedtime.  90 capsule  3  . MOVIPREP 100 G SOLR moviprep as directed. No substitutions  1 kit  0   No facility-administered medications prior to visit.    PAST  MEDICAL HISTORY: Past Medical History  Diagnosis Date  . Sleep apnea syndrome     tested  under [facility],  12-2-- 2004  . RLS (restless legs syndrome)     PAST SURGICAL HISTORY: Past Surgical History  Procedure Laterality Date  . Appendectomy    . Hernia repair      FAMILY HISTORY: Family History  Problem Relation Age of Onset  . Hypertension Mother   . Diabetes Mother   . Colon cancer Neg Hx     SOCIAL HISTORY: History   Social History  . Marital Status: Married    Spouse Name: [name]    Number of Children: 3  . Years of Education: N/A   Occupational History  . Not on file.   Social History Main Topics  . Smoking status: Never Smoker   . Smokeless tobacco: Never Used  . Alcohol Use: 3.5 oz/week    7 drink(s) per week  . Drug Use: No  . Sexual Activity: Yes   Other Topics Concern  . Not on file   Social History Narrative   Patient lives at home with wife [name].    Patient has 3 children.    Patient has a BA.      PHYSICAL EXAM  Filed Vitals:   10/03/13 1421  BP: 124/74  Pulse: 54  Height: 5' 11.5" (1.816 m)  Weight: 218 lb (98.884 kg)   Body mass index is 29.98 kg/(m^2).  Generalized: Well developed, in no acute distress  Head: normocephalic and atraumatic,. Oropharynx benign , mallampati 2. Neck: Supple, no carotid bruits , 15 inches Cardiac: Regular rate rhythm, no murmur  Musculoskeletal: No deformity   Neurological examination   Mentation: Alert oriented to time, place, history taking. Follows all commands speech and language fluent. ESS 15, FSS 48  Cranial nerve II-XII: .Pupils were equal round reactive to light extraocular movements were full, visual field were full on confrontational test. Facial sensation and strength were normal. hearing was intact to finger rubbing bilaterally. Uvula tongue midline. head turning and shoulder shrug and were normal and symmetric.Tongue protrusion into cheek strength was normal. Motor:  normal bulk and tone, full strength in the BUE, BLE, fine finger movements normal, no pronator drift. No focal weakness Coordination: finger-nose-finger, heel-to-shin bilaterally, no dysmetria Reflexes: Brachioradialis 2/2, biceps 2/2, triceps 2/2, patellar 2/2, Achilles 2/2, plantar responses were flexor bilaterally. Gait and Station: Rising up from seated position without assistance, normal stance,  moderate stride, good arm swing, smooth turning, able to perform tiptoe, and heel walking without difficulty.   DIAGNOSTIC DATA (LABS, IMAGING, TESTING) - I reviewed patient records, labs, notes, testing and imaging myself where available.  Lab Results  Component Value Date   WBC 5.8 07/20/2013   HGB 14.7 07/20/2013   HCT 45.4 07/20/2013   MCV 95.6 07/20/2013   PLT 288.0 06/16/2009  Component Value Date/Time   NA 139 07/20/2013 1004   K 4.4 07/20/2013 1004   CL 104 07/20/2013 1004   CO2 28 07/20/2013 1004   GLUCOSE 94 07/20/2013 1004   BUN 15 07/20/2013 1004   CREATININE 1.16 07/20/2013 1004   CREATININE 1.1 06/16/2009 1711   CALCIUM 8.9 07/20/2013 1004   PROT 7.4 07/20/2013 1004   ALBUMIN 4.4 07/20/2013 1004   AST 23 07/20/2013 1004   ALT 27 07/20/2013 1004   ALKPHOS 87 07/20/2013 1004   BILITOT 0.7 07/20/2013 1004   GFRNONAA 75.22 06/16/2009 1711   GFRAA 83 06/05/2008 0000   Lab Results  Component Value Date   CHOL 164 07/20/2013   HDL 34* 07/20/2013   LDLCALC 89 07/20/2013   TRIG 203* 07/20/2013   CHOLHDL 4.8 07/20/2013     Lab Results  Component Value Date   TSH 2.874 07/20/2013      ASSESSMENT AND PLAN  52 y.o. year old male  has a past medical history of Sleep apnea syndrome and RLS (restless legs syndrome). here to followup. He was placed on Adderall which has helped his daytime drowsiness.ESS 15, FSS 48.   Continue Adderall for now as directed Rx given to patient Continue gabapentin for restless leg syndrome Will set up for sleep test Followup after sleep  test Dennie Bible, [facility], [facility], APRN  [facility] [address] [place], [place] [address] [phone]

## 2013-10-08 ENCOUNTER — Ambulatory Visit (INDEPENDENT_AMBULATORY_CARE_PROVIDER_SITE_OTHER): Payer: BC Managed Care – PPO

## 2013-10-08 DIAGNOSIS — R0989 Other specified symptoms and signs involving the circulatory and respiratory systems: Secondary | ICD-10-CM

## 2013-10-08 DIAGNOSIS — R0609 Other forms of dyspnea: Secondary | ICD-10-CM

## 2013-10-11 ENCOUNTER — Telehealth: Payer: Self-pay | Admitting: Nurse Practitioner

## 2013-10-11 NOTE — Telephone Encounter (Signed)
I called and spoke with the patient's spouse about rescheduling the patient 's appointment for 10-12-13 due to a mix up. I informed the patient's spouse that I needed to schedule her husband(patient) with [name]. Patient's spouse stated she'll relay the message and have him to give me a callback.

## 2013-10-12 ENCOUNTER — Ambulatory Visit: Payer: BC Managed Care – PPO | Admitting: Nurse Practitioner

## 2013-10-15 ENCOUNTER — Other Ambulatory Visit: Payer: Self-pay | Admitting: *Deleted

## 2013-10-15 ENCOUNTER — Encounter: Payer: Self-pay | Admitting: *Deleted

## 2013-10-15 DIAGNOSIS — G4733 Obstructive sleep apnea (adult) (pediatric): Secondary | ICD-10-CM

## 2013-10-17 ENCOUNTER — Telehealth: Payer: Self-pay | Admitting: *Deleted

## 2013-10-17 ENCOUNTER — Other Ambulatory Visit: Payer: Self-pay | Admitting: Neurology

## 2013-10-17 DIAGNOSIS — G4733 Obstructive sleep apnea (adult) (pediatric): Secondary | ICD-10-CM

## 2013-10-17 NOTE — Telephone Encounter (Signed)
Had extensive conversation with patient regarding his sleep study report information.  He had a copy in hand but expressed difficulty in understanding all of it and a desire to prepare himself to get the most out of his follow up appointment with [name].  We went through each section together a piece at a time and he had many questions which were mostly answered.  He understands the importance of Dr. Edwena Felty interpretation of his history and sleep study data in formulating recommendations.  He appreciated this time and I feel like he will get more out of his visit with [name] because of it. -sh

## 2013-10-23 ENCOUNTER — Ambulatory Visit (AMBULATORY_SURGERY_CENTER): Payer: BC Managed Care – PPO | Admitting: Gastroenterology

## 2013-10-23 ENCOUNTER — Encounter: Payer: Self-pay | Admitting: Gastroenterology

## 2013-10-23 VITALS — BP 156/102 | HR 53 | Temp 97.1°F | Resp 19 | Ht 72.0 in | Wt 217.0 lb

## 2013-10-23 DIAGNOSIS — R195 Other fecal abnormalities: Secondary | ICD-10-CM

## 2013-10-23 DIAGNOSIS — K900 Celiac disease: Secondary | ICD-10-CM

## 2013-10-23 DIAGNOSIS — K921 Melena: Secondary | ICD-10-CM

## 2013-10-23 MED ORDER — SODIUM CHLORIDE 0.9 % IV SOLN: 500.0000 mL | INTRAVENOUS | Status: DC

## 2013-10-23 NOTE — Patient Instructions (Signed)
YOU HAD AN ENDOSCOPIC PROCEDURE TODAY AT [facility]: Refer to the procedure report that was given to you for any specific questions about what was found during the examination.  If the procedure report does not answer your questions, please call your gastroenterologist to clarify.  If you requested that your care partner not be given the details of your procedure findings, then the procedure report has been included in a sealed envelope for you to review at your convenience later.  YOU SHOULD EXPECT: Some feelings of bloating in the abdomen. Passage of more gas than usual.  Walking can help get rid of the air that was put into your GI tract during the procedure and reduce the bloating. If you had a lower endoscopy (such as a colonoscopy or flexible sigmoidoscopy) you may notice spotting of blood in your stool or on the toilet paper. If you underwent a bowel prep for your procedure, then you may not have a normal bowel movement for a few days.  DIET: Your first meal following the procedure should be a light meal and then it is ok to progress to your normal diet.  A half-sandwich or bowl of soup is an example of a good first meal.  Heavy or fried foods are harder to digest and may make you feel nauseous or bloated.  Likewise meals heavy in dairy and vegetables can cause extra gas to form and this can also increase the bloating.  Drink plenty of fluids but you should avoid alcoholic beverages for 24 hours.  ACTIVITY: Your care partner should take you home directly after the procedure.  You should plan to take it easy, moving slowly for the rest of the day.  You can resume normal activity the day after the procedure however you should NOT DRIVE or use heavy machinery for 24 hours (because of the sedation medicines used during the test).    SYMPTOMS TO REPORT IMMEDIATELY: A gastroenterologist can be reached at any hour.  During normal business hours, 8:30 AM to 5:00 PM Monday through Friday,  call [phone].  After hours and on weekends, please call the GI answering service at [phone] who will take a message and have the physician on call contact you.   Following lower endoscopy (colonoscopy or flexible sigmoidoscopy):  Excessive amounts of blood in the stool  Significant tenderness or worsening of abdominal pains  Swelling of the abdomen that is new, acute  Fever of 100F or higher  Following upper endoscopy (EGD)  Vomiting of blood or coffee ground material  New chest pain or pain under the shoulder blades  Painful or persistently difficult swallowing  New shortness of breath  Fever of 100F or higher  Black, tarry-looking stools  FOLLOW UP: If any biopsies were taken you will be contacted by phone or by letter within the next 1-3 weeks.  Call your gastroenterologist if you have not heard about the biopsies in 3 weeks.  Our staff will call the home number listed on your records the next business day following your procedure to check on you and address any questions or concerns that you may have at that time regarding the information given to you following your procedure. This is a courtesy call and so if there is no answer at the home number and we have not heard from you through the emergency physician on call, we will assume that you have returned to your regular daily activities without incident.  SIGNATURES/CONFIDENTIALITY: You and/or your care  partner have signed paperwork which will be entered into your electronic medical record.  These signatures attest to the fact that that the information above on your After Visit Summary has been reviewed and is understood.  Full responsibility of the confidentiality of this discharge information lies with you and/or your care-partner.  Recommendations You should continue to follow colorectal cancer screening guidelines for "routine risk" patients with a repeat colonoscopy in 10 years with a more extensive bowel prep.  There is no need for routine screening FOBT (stool) testing for at least 5 years.  Await pathology results

## 2013-10-23 NOTE — Op Note (Signed)
[facility]  [address] [place], [address]   COLONOSCOPY PROCEDURE REPORT  PATIENT: Aaron [name], Aaron [name]  MR#: #[id number] BIRTHDATE: Jan 25, 1961 , 56  yrs. old GENDER: Male ENDOSCOPIST: Ladene Artist, MD, [facility] PROCEDURE DATE:  10/23/2013 PROCEDURE:   Colonoscopy, diagnostic First Screening Colonoscopy - Avg.  risk and is 50 yrs.  old or older - No.  Prior Negative Screening - Now for repeat screening. N/A  History of Adenoma - Now for follow-up colonoscopy & has been > or = to 3 yrs.  N/A  Polyps Removed Today? No.  Recommend repeat exam, <10 yrs? No. ASA CLASS:   Class II INDICATIONS:heme-positive stool. MEDICATIONS: MAC sedation, administered by CRNA and propofol (Diprivan) 521m IV DESCRIPTION OF PROCEDURE:   After the risks benefits and alternatives of the procedure were thoroughly explained, informed consent was obtained.  A digital rectal exam revealed no abnormalities of the rectum.   The LB [id number] endoscope was introduced through the anus and advanced to the cecum, which was identified by both the appendix and ileocecal valve. No adverse events experienced with a tortuous and redundant colon.   The quality of the prep was adequate, using MoviPrep  The instrument was then slowly withdrawn as the colon was fully examined.  COLON FINDINGS: A normal appearing cecum, ileocecal valve, and appendiceal orifice were identified.  The ascending, hepatic flexure, transverse, splenic flexure, descending, sigmoid colon and rectum appeared unremarkable.  No polyps or cancers were seen. Retroflexed views revealed internal hemorrhoids. The time to cecum=8 minutes 00 seconds.  Withdrawal time=22 minutes 00 seconds. The scope was withdrawn and the procedure completed.  COMPLICATIONS: There were no complications.  ENDOSCOPIC IMPRESSION: 1.  Normal colon 2.  Smal internal hemorrhoids  RECOMMENDATIONS: 1.  You should continue to follow colorectal cancer  screening guidelines for "routine risk" patients with a repeat colonoscopy in 10 years with a more extensive bowel prep.  There is no need for routine screening FOBT (stool) testing for at least 5 years.  eSigned:  MLadene Artist MD, FMarval Regal12/16/2014 3:12 PM   cc: DRuben Reason MD

## 2013-10-23 NOTE — Progress Notes (Signed)
Called to room to assist during endoscopic procedure.  Patient ID and intended procedure confirmed with present staff. Received instructions for my participation in the procedure from the performing physician.  

## 2013-10-23 NOTE — Progress Notes (Signed)
Patient did not have preoperative order for IV antibiotic SSI prophylaxis. (G8918)  Patient did not experience any of the following events: a burn prior to discharge; a fall within the facility; wrong site/side/patient/procedure/implant event; or a hospital transfer or hospital admission upon discharge from the facility. (G8907)  

## 2013-10-23 NOTE — Op Note (Signed)
[place]  [address]. [place], [address]   ENDOSCOPY PROCEDURE REPORT  PATIENT: Aaron [name], Aaron [name]  MR#: #[id number] BIRTHDATE: [birth year] , 59  yrs. old GENDER: Male ENDOSCOPIST: Ladene Artist, MD, [facility] PROCEDURE DATE:  10/23/2013 PROCEDURE:  EGD w/ biopsy ASA CLASS:     Class II INDICATIONS:  celiac disease.  Occult blood positive. MEDICATIONS: There was residual sedation effect present from prior procedure, MAC sedation, administered by CRNA, and propofol (Diprivan) 128m IV TOPICAL ANESTHETIC: Cetacaine Spray DESCRIPTION OF PROCEDURE: After the risks benefits and alternatives of the procedure were thoroughly explained, informed consent was obtained.  The LB [id number] was introduced through the mouth and advanced to the second portion of the duodenum. Without limitations.  The instrument was slowly withdrawn as the mucosa was fully examined.  ESOPHAGUS: The mucosa of the esophagus appeared normal. STOMACH: The mucosa and folds of the stomach appeared normal. DUODENUM: Abnormal mucosa was found in the 2nd part of the duodenum. Several folds with possible scalloping. Multiple biopsies were performed.  The duodenal mucosa showed no abnormalities in the duodenal bulb.  Cold forceps biopsies were taken in the bulb. Retroflexed views revealed no abnormalities.  The scope was then withdrawn from the patient and the procedure completed.  COMPLICATIONS: There were no complications.  ENDOSCOPIC IMPRESSION: 1.  Abnormal mucosa in the 2nd part of the duodenum; multiple biopsies 2.  The EGD was otherwise normal  RECOMMENDATIONS: 1.  Await pathology results  eSigned:  MLadene Artist MD, FMarval Regal12/16/2014 3:22 PM   CYE:BXIDHHLinna Darner MD

## 2013-10-24 ENCOUNTER — Telehealth: Payer: Self-pay | Admitting: *Deleted

## 2013-10-24 NOTE — Telephone Encounter (Signed)
Message left

## 2013-10-26 ENCOUNTER — Ambulatory Visit (INDEPENDENT_AMBULATORY_CARE_PROVIDER_SITE_OTHER): Payer: BC Managed Care – PPO | Admitting: Neurology

## 2013-10-26 ENCOUNTER — Encounter: Payer: Self-pay | Admitting: Neurology

## 2013-10-26 VITALS — BP 117/81 | HR 82 | Ht 71.0 in | Wt 215.0 lb

## 2013-10-26 DIAGNOSIS — R0683 Snoring: Secondary | ICD-10-CM

## 2013-10-26 DIAGNOSIS — F908 Attention-deficit hyperactivity disorder, other type: Secondary | ICD-10-CM

## 2013-10-26 DIAGNOSIS — R0609 Other forms of dyspnea: Secondary | ICD-10-CM

## 2013-10-26 DIAGNOSIS — F909 Attention-deficit hyperactivity disorder, unspecified type: Secondary | ICD-10-CM

## 2013-10-26 DIAGNOSIS — G2581 Restless legs syndrome: Secondary | ICD-10-CM

## 2013-10-26 DIAGNOSIS — R0989 Other specified symptoms and signs involving the circulatory and respiratory systems: Secondary | ICD-10-CM

## 2013-10-26 MED ORDER — AMPHETAMINE-DEXTROAMPHETAMINE 10 MG PO TABS: 10.0000 mg | ORAL_TABLET | ORAL | Status: DC

## 2013-10-26 MED ORDER — ZOLPIDEM TARTRATE ER 6.25 MG PO TBCR: 6.2500 mg | EXTENDED_RELEASE_TABLET | Freq: Every evening | ORAL | Status: DC | PRN

## 2013-10-26 MED ORDER — GABAPENTIN 300 MG PO CAPS: 300.0000 mg | ORAL_CAPSULE | Freq: Every day | ORAL | Status: DC

## 2013-10-26 NOTE — Patient Instructions (Addendum)
Insomnia Insomnia means you have trouble falling or staying asleep. It affects about one person in three at different times and is usually related to stress from work, school, or personal relations. Insomnia is also a sign of depression or anxiety. Other medical problems that cause insomnia include conditions that cause pain, night leg cramps, coughing, shortness of breath, urinary problems, and fevers. Sleep apnea is an abnormal breathing pattern at night that can cause insomnia and loud snoring. Certain medications and excess intake of caffeine drinks (coffee, tea, colas) can also interfere with normal sleep. Treatment for insomnia depends on the cause. Besides specific medical treatment, the following measures can help you relax and get better sleep. Get regular exercise every day, at least several hours before bed time. Try to get to bed at the same time every night. Take a hot bath before retiring to help you relax. Do not stay in bed if you are unable to sleep. During the daytime avoid staying in bed to watch television, eat, or read. Reduce unwanted noise and light in your room. Keep your room at a comfortable temperature. Avoid alcohol as it causes one to sleep less soundly, may cause you to awaken during the night, and can leave you feeling groggy the next day. Using a mild sedative prescribed or suggested by your caregiver may be needed, but the daily use of sleeping pills is not recommended. Anti-depressant medicines can improve sleep in people with depression. Please call your doctor for follow up care to better understand the cause and proper treatment of your insomnia. Document Released: 12/02/2004 Document Revised: 01/17/2012 Document Reviewed: 10/25/2005 [facility] Patient Information 2014 [place]. Zolpidem extended-release tablets What is this medicine? ZOLPIDEM (zole PI dem) is used to treat insomnia. This medicine helps you to fall asleep and sleep through the night. This medicine  may be used for other purposes; ask your health care provider or pharmacist if you have questions. COMMON BRAND NAME(S): Ambien CR What should I tell my health care provider before I take this medicine? They need to know if you have any of these conditions: -depression -history of a drug or alcohol abuse problem -liver disease -lung or breathing disease -suicidal thoughts -an unusual or allergic reaction to zolpidem, other medicines, foods, dyes, or preservatives -pregnant or trying to get pregnant -breast-feeding How should I use this medicine? Take this medicine by mouth with a glass of water. Follow the directions on the prescription label. Do not crush, split, or chew the tablet before swallowing. It is better to take this medicine on an empty stomach and only when you are ready for bed. Do not take your medicine more often than directed. If you have been taking this medicine for several weeks and suddenly stop taking it, you may get unpleasant withdrawal symptoms. Your doctor or health care professional may want to gradually reduce the dose. Do not stop taking this medicine on your own. Always follow your doctor or health care professional's advice. A special MedGuide will be given to you by the pharmacist with each prescription and refill. Be sure to read this information carefully each time. Talk to your pediatrician regarding the use of this medicine in children. Special care may be needed. Overdosage: If you think you have taken too much of this medicine contact a poison control center or emergency room at once. NOTE: This medicine is only for you. Do not share this medicine with others. What if I miss a dose? This does not apply. This medicine  should only be taken immediately before going to sleep. Do not take double or extra doses. What may interact with this medicine? -herbal medicines like kava kava, melatonin, St. John's wort and valerian -medicines for fungal infections like  ketoconazole, fluconazole, or itraconazole -medicines for treating depression or other mental problems -other medicines given for sleep -some medicines for Parkinson' s disease or other movement disorders -some medicines used to treat HIV infection or AIDS, like ritonavir This list may not describe all possible interactions. Give your health care provider a list of all the medicines, herbs, non-prescription drugs, or dietary supplements you use. Also tell them if you smoke, drink alcohol, or use illegal drugs. Some items may interact with your medicine. What should I watch for while using this medicine? Visit your doctor or health care professional for regular checks on your progress. Keep a regular sleep schedule by going to bed at about the same time each night. Avoid caffeine-containing drinks in the evening hours. When sleep medicines are used every night for more than a few weeks, they may stop working. Talk to your doctor if your insomnia worsens or is not better within 7 to 10 days. You may not be able to remember things that you do in the hours after you take this medicine. Some people have reported driving, making phone calls, or preparing and eating food while asleep after taking sleep medicine. Take this medicine right before going to sleep. Tell your doctor if you are having any problems with your memory. After you stop taking this medicine, you may have trouble falling asleep. This is called rebound insomnia. This problem usually goes away on its own after 1 or 2 nights. Do not take this medicine unless you are able to stay in bed for a full night (7 to 8 hours) before you must be active again. Do not drive, use machinery, or do anything that needs mental alertness the day after you take this medicine. You may have a decrease in mental alertness the day after use, even if you feel that you are fully awake. Tell your doctor if you will need to perform activities requiring full alertness, such  as driving, the next day. You may get drowsy or dizzy. Do not stand or sit up quickly, especially if you are an older patient. This reduces the risk of dizzy or fainting spells. Alcohol may interfere with the effect of this medicine. Avoid alcoholic drinks. If you or your family notice any changes in your behavior, or if you have any unusual or disturbing thoughts, call your doctor right away. What side effects may I notice from receiving this medicine? Side effects that you should report to your doctor or health care professional as soon as possible: -allergic reactions like skin rash, itching or hives, swelling of the face, lips, or tongue -changes in vision -confusion -depressed mood -feeling faint or lightheaded, falls -hallucinations -problems with balance, speaking, walking -restlessness, excitability, or feelings of agitation -unusual activities while asleep like driving, eating, making phone calls Side effects that usually do not require medical attention (report to your doctor or health care professional if they continue or are bothersome): -diarrhea -dizziness, or daytime drowsiness, sometimes called a hangover effect -headache This list may not describe all possible side effects. Call your doctor for medical advice about side effects. You may report side effects to FDA at 1-800-FDA-1088. Where should I keep my medicine? Keep out of the reach of children. This medicine can be abused. Keep your medicine  in a safe place to protect it from theft. Do not share this medicine with anyone. Selling or giving away this medicine is dangerous and against the law. Store at controlled room temperature between 15 and 25 degrees C (59 and 77 degrees F). Throw away any unused medicine after the expiration date. NOTE: This sheet is a summary. It may not cover all possible information. If you have questions about this medicine, talk to your doctor, pharmacist, or health care provider.  2014,  Elsevier/Gold Standard. (2012-03-23 12:44:09)

## 2013-10-26 NOTE — Progress Notes (Addendum)
[facility]  Provider:  Larey Seat, M D  Referring Provider: Posey Boyer, MD Primary Care Physician:  Dr Unice Cobble.   Chief Complaint  Patient presents with  . SLEEP CONSULT    Pt is here to discuss his sleep study results    HPI:  Aaron [name] is a 52 y.o. male , seen here in a visit , originally referred by Dr. Linna Darner, for evaluation of a  sleep disorder.   Interval history; Mr. or Aaron [name] underwent a sleep study date 10-08-13 which resulted in no definite diagnosis for him. He was clearly having trouble initiating and maintaining sleep through the night and slept for less than 4 hours. Apnea was ruled out.   However, he has no form of  sleep apnea of any clinical significance, neither obstructive or central.  He has no oxygen desaturations. He had only 11 periodic limb movements and have confirmed that his restless leg syndrome has greatly improved this medication. His heart rate during the night was slow and regular there was no sign of external distress of physiological stress on his body. [name] has pretty much not been able to sleep for more than 5 or 6 hours at night- uninterrupted, not for the last 2 decades.  He has ADHD and feels more focussed, but he is still hyperactive. Since he takes only 20 mg daily divided into doses, this may not be enough for his body mass index. In addition I will be happy to document for him but he clearly has nor sleep apnea at this time.  His insomnia is moderate ,further again, I felt there was no physiologic reason for him to wake up and have trouble to reinitiate sleep.  This night apparently was not that unrepresentative for his sleep at home , where he would frequently wake up in the early morning hours-  This causing  trouble to fall asleep again.  Patient is not aware of any reason to wake up in the middle of the night, has no urge to go  to the bathroom,  but does only because he is already of weeks to  resolve.  Gabapentin has treated his restless leg sufficiently, 2 -3 hours before bedtime.    Last visit note:  Patient with mild sleep apnea , not on CPAP, and restless legs, and a history of AD HD. He had for years struggled to fall asleep, and had negative reaction Mirapex. [name] and goals today the Epworth sleepiness score at 12 points, fatigue severity score at 44 points and the [facility] restless leg questionnaire in a medium impairment range he states that he has trouble concentrating in the afternoon most of the time, most of the time trouble concentrating in the evening, that he has some problems to initiate sleep, and that he feels that in the morning- he often craves more sleep . The restless legs are quite a bit distressing to him. RLS begun 10 years ago  progressive impairment.  The patient had presented to an RLS specific trial and the colleagues prescribed 300 mg of gabapentin under the brand name normal on 10 to be taken at night for him. Due to lack of insurance at the time he could not introduce the medication but he has started about 2 or 3 weeks ago and feels that it has given him some relief. I have also his recent laboratory results. Visual normal sodium and potassium levels, normal liver function tests and renal clearance. He  has a slightly lower than perfect HDL and he has increased triglycerides. He has gout and celiac disease. He had absorption problems with Iron and takes now a supplement. He has an active lifestyle , he drinks 10 alcoholic beverages a week, he has regular sleep habits.  The patient's the patient will keep an average bedtime between 10 and 11 PM rarely but sometimes he will fall asleep already before watching TV.  His wife and he share a King size bed, quiet bedroom, cool, and dark. He will almost regularly wake up at about 2 AM after 3-4 hours of sleep , and continues to spend the night often another room watching some TV until he can fall asleep again.  The total sleep time overnight is estimated at 4-5 hours , rarely 6 . He drinks caffeine in AM 2-3 cups,  Nothing after noon.  Wide reports him kicking a lot, sleep talking and sleep walking denied, but snoring ;yes, No REM BD symptoms.  dry eyes in AM not dry mouth.   No spinal injuries, no trauma not surgery to airways, facial skull.  Family history of insomnia, diabetes and obesity.    Review of Systems: Out of a complete 14 system review, the patient complains of only the following symptoms, and all other reviewed systems are negative. Insomia, RLS  Creepy crawly , needs to walk.   History   Social History  . Marital Status: Married    Spouse Name: Aaron [name]    Number of Children: 3  . Years of Education: N/A   Occupational History  . Not on file.   Social History Main Topics  . Smoking status: Never Smoker   . Smokeless tobacco: Never Used  . Alcohol Use: 3.5 oz/week    7 drink(s) per week  . Drug Use: No  . Sexual Activity: Yes   Other Topics Concern  . Not on file   Social History Narrative   Patient lives at home with wife Aaron [name].    Patient has 3 children.    Patient has a BA.     Family History  Problem Relation Age of Onset  . Hypertension Mother   . Diabetes Mother   . Colon cancer Neg Hx     Past Medical History  Diagnosis Date  . Sleep apnea syndrome     tested  under [place] lab,  12-2-- 2004  . RLS (restless legs syndrome)     Past Surgical History  Procedure Laterality Date  . Appendectomy    . Hernia repair      Current Outpatient Prescriptions  Medication Sig Dispense Refill  . amphetamine-dextroamphetamine (ADDERALL) 10 MG tablet Take 1 tablet (10 mg total) by mouth 2 (two) times daily. Take 2 a day in AM po.  60 tablet  0  . ferrous sulfate 325 (65 FE) MG tablet Take 325 mg by mouth daily with breakfast.      . fish oil-omega-3 fatty acids 1000 MG capsule Take 2 g by mouth daily.      Marland Kitchen gabapentin (NEURONTIN) 300 MG capsule  Take 1 capsule (300 mg total) by mouth at bedtime.  90 capsule  3   No current facility-administered medications for this visit.    Allergies as of 10/26/2013  . (No Known Allergies)    Vitals: BP 117/81  Pulse 82  Ht 5' 11"  (1.803 m)  Wt 215 lb (97.523 kg)  BMI 30.00 kg/m2 Last Weight:  Wt Readings from Last 1 Encounters:  10/26/13 215 lb (97.523 kg)   Last Height:   Ht Readings from Last 1 Encounters:  10/26/13 5' 11"  (1.803 m)    Physical exam:  General: The patient is awake, alert and appears not in acute distress. The patient is well groomed. Head: Normocephalic, atraumatic. Neck is supple. Mallampati 2 , neck circumference: 14.5 , no retrognathia.  Slight deviation of the nasal septum.  Cardiovascular:  Regular rate and rhythm , without  murmurs or carotid bruit, and without distended neck veins. Respiratory: Lungs are clear to auscultation. Skin:  Without evidence of edema, or rash Trunk: BMI is elevated ,patient  has normal posture.  Neurologic exam : The patient is awake and alert, oriented to place and time.  Memory subjective   described as intact. There is a normal attention span & concentration ability. Speech is fluent without  dysarthria, dysphonia or aphasia.  Mood and affect are appropriate.  Cranial nerves: Pupils are equal and briskly reactive to light. Funduscopic exam without  evidence of pallor or edema. Extraocular movements  in vertical and horizontal planes intact and without nystagmus. Visual fields by finger perimetry are intact. Hearing to finger rub intact.  Facial sensation intact to fine touch. Facial motor strength is symmetric and tongue and uvula move midline.  Motor exam:   Normal tone and normal muscle bulk and symmetric normal strength in all extremities. The patient has increased tone  over the right shoulder, football injury. No cog wheeling.    Sensory:  Fine touch, pinprick and vibration were tested in all extremities. Proprioception  is normal.  Coordination: Rapid alternating movements in the fingers/hands is tested and normal. Finger-to-nose maneuver tested without evidence of ataxia, dysmetria or tremor.  Gait and station: Patient walks without assistive device. Strength within normal limits.     Assessment: ADHD, Insomnia,  no apnea.   PLAN:  Gabapentin 300 mg nightly, evening dose .  Adderall 30 mg in AM.  NO CPAP needed, no apnea is present.

## 2013-10-29 ENCOUNTER — Encounter: Payer: Self-pay | Admitting: Gastroenterology

## 2013-11-05 ENCOUNTER — Encounter: Payer: Self-pay | Admitting: Neurology

## 2013-11-05 ENCOUNTER — Telehealth: Payer: Self-pay | Admitting: Gastroenterology

## 2013-11-05 NOTE — Telephone Encounter (Signed)
Long discussion about celiac disease and all questions answered.  He will call back for any additional questions or concerns

## 2013-11-05 NOTE — Telephone Encounter (Signed)
i have stated the AHI and RDI and the diagnosis in the  Note. i will add another sentence to clarify once and for all - but the note speaks already for itself.  CD

## 2013-11-06 ENCOUNTER — Ambulatory Visit (INDEPENDENT_AMBULATORY_CARE_PROVIDER_SITE_OTHER): Payer: BC Managed Care – PPO | Admitting: Family Medicine

## 2013-11-06 VITALS — BP 124/94 | HR 54 | Temp 98.0°F | Resp 16 | Ht 72.0 in | Wt 216.6 lb

## 2013-11-06 DIAGNOSIS — E781 Pure hyperglyceridemia: Secondary | ICD-10-CM

## 2013-11-06 DIAGNOSIS — G2581 Restless legs syndrome: Secondary | ICD-10-CM

## 2013-11-06 DIAGNOSIS — G479 Sleep disorder, unspecified: Secondary | ICD-10-CM

## 2013-11-06 DIAGNOSIS — E7421 Galactosemia: Secondary | ICD-10-CM

## 2013-11-06 MED ORDER — GABAPENTIN 400 MG PO CAPS: ORAL_CAPSULE | ORAL | Status: DC

## 2013-11-06 NOTE — Patient Instructions (Signed)
Increase the Neurontin to 400 mg daily for restless legs  Do to the gluten issues, I think it is reasonable to take a multivitamin every day or 2 and a low dose of calcium, one pill of a 600 mg every couple of days or a TUMS which is calcium carbonate daily.  Plan to return this fall for a physical again.

## 2013-11-06 NOTE — Progress Notes (Signed)
Subjective: The patient is to have a number of concerns. The gastroenterologist told him that his celiac disease was worse. He's stressed the need of abiding by his diet.  He has gout which bothers him occasionally  He had a bone density done and wanted to discuss that. He is concerned about his bone density and calcium.  He had questions about his lipid levels.  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  Objective: Did not really examine him today had a long discussion with him. Reviewed his bone density test. Reviewed his labs from back in the fall.  Assessment: Celiac disease History of gout Satisfactory bone density Mild hypertriglyceridemia  Plan: It is fine to take a multivitamin and a calcium pill occasionally if needed. I really think the most important thing is a balanced diet, avoiding glutton, and regular exercise. Return this fall for recheck.

## 2013-11-12 ENCOUNTER — Encounter: Payer: Self-pay | Admitting: Neurology

## 2013-11-13 NOTE — Telephone Encounter (Signed)
Spoke to patient and relayed that [name] did respond to his e-mail.  He stated he didn't receive the response.  I told him she clearly stated in his follow up office note from 10-26-13 that he did not have sleep apnea.  I will mail him a copy of the note.

## 2014-03-18 ENCOUNTER — Encounter: Payer: Self-pay | Admitting: Nurse Practitioner

## 2014-04-24 ENCOUNTER — Ambulatory Visit: Payer: BC Managed Care – PPO | Admitting: Nurse Practitioner

## 2014-07-05 ENCOUNTER — Ambulatory Visit: Payer: BC Managed Care – PPO | Admitting: Nurse Practitioner

## 2014-09-26 ENCOUNTER — Ambulatory Visit: Payer: Self-pay | Admitting: Nurse Practitioner

## 2014-11-15 ENCOUNTER — Other Ambulatory Visit: Payer: Self-pay

## 2014-11-15 MED ORDER — GABAPENTIN 300 MG PO CAPS: 300.0000 mg | ORAL_CAPSULE | Freq: Every day | ORAL | Status: DC

## 2014-12-11 ENCOUNTER — Ambulatory Visit (INDEPENDENT_AMBULATORY_CARE_PROVIDER_SITE_OTHER): Payer: 59 | Admitting: Family Medicine

## 2014-12-11 VITALS — BP 138/80 | HR 58 | Temp 97.5°F | Resp 16 | Ht 71.5 in | Wt 219.0 lb

## 2014-12-11 DIAGNOSIS — L739 Follicular disorder, unspecified: Secondary | ICD-10-CM

## 2014-12-11 DIAGNOSIS — K900 Celiac disease: Secondary | ICD-10-CM

## 2014-12-11 DIAGNOSIS — G479 Sleep disorder, unspecified: Secondary | ICD-10-CM

## 2014-12-11 DIAGNOSIS — D229 Melanocytic nevi, unspecified: Secondary | ICD-10-CM

## 2014-12-11 DIAGNOSIS — G2581 Restless legs syndrome: Secondary | ICD-10-CM

## 2014-12-11 DIAGNOSIS — T50901A Poisoning by unspecified drugs, medicaments and biological substances, accidental (unintentional), initial encounter: Secondary | ICD-10-CM

## 2014-12-11 DIAGNOSIS — Z23 Encounter for immunization: Secondary | ICD-10-CM

## 2014-12-11 MED ORDER — GABAPENTIN 300 MG PO CAPS: 300.0000 mg | ORAL_CAPSULE | Freq: Every day | ORAL | Status: DC

## 2014-12-11 MED ORDER — CLINDAMYCIN PHOSPHATE 1 % EX GEL: Freq: Two times a day (BID) | CUTANEOUS | Status: DC

## 2014-12-11 NOTE — Patient Instructions (Addendum)
Plan to see the gastroenterologist back regarding the celiac problems. At that time ask him about his concerns with the patient with your problems if you did end up taking a longer term doxycycline medication for your rashes.   Use the clindamycin gel on the rash twice daily. If not improving over 3 or 4 weeks, then we would consider referral to a dermatologist.   Referral is being made to a neurologist, , for the sleep study analysis again to follow-up on that. I will let them decide on what to order based on their previous records on you.  Continue using the Neurontin (gabapentin)    if you notice any changes in the mole on the right side of the face let me know.   Plan to return in about mid-to-late summer for a complete physical examination and associated labs   Pneumococcal Vaccine, Polyvalent solution for injection What is this medicine? PNEUMOCOCCAL VACCINE, POLYVALENT (NEU mo KOK al vak SEEN, pol ee VEY luhnt) is a vaccine to prevent pneumococcus bacteria infection. These bacteria are a major cause of ear infections, Strep throat infections, and serious pneumonia, meningitis, or blood infections worldwide. These vaccines help the body to produce antibodies (protective substances) that help your body defend against these bacteria. This vaccine is recommended for people 19 years of age and older with health problems. It is also recommended for all adults over 58 years old. This vaccine will not treat an infection. This medicine may be used for other purposes; ask your health care provider or pharmacist if you have questions. COMMON BRAND NAME(S): Pneumovax 23 What should I tell my health care provider before I take this medicine? They need to know if you have any of these conditions: -bleeding problems -bone marrow or organ transplant -cancer, Hodgkin's disease -fever -infection -immune system problems -low platelet count in the blood -seizures -an unusual or allergic reaction to  pneumococcal vaccine, diphtheria toxoid, other vaccines, latex, other medicines, foods, dyes, or preservatives -pregnant or trying to get pregnant -breast-feeding How should I use this medicine? This vaccine is for injection into a muscle or under the skin. It is given by a health care professional. A copy of Vaccine Information Statements will be given before each vaccination. Read this sheet carefully each time. The sheet may change frequently. Talk to your pediatrician regarding the use of this medicine in children. While this drug may be prescribed for children as young as 21 years of age for selected conditions, precautions do apply. Overdosage: If you think you have taken too much of this medicine contact a poison control center or emergency room at once. NOTE: This medicine is only for you. Do not share this medicine with others. What if I miss a dose? It is important not to miss your dose. Call your doctor or health care professional if you are unable to keep an appointment. What may interact with this medicine? -medicines for cancer chemotherapy -medicines that suppress your immune function -medicines that treat or prevent blood clots like warfarin, enoxaparin, and dalteparin -steroid medicines like prednisone or cortisone This list may not describe all possible interactions. Give your health care provider a list of all the medicines, herbs, non-prescription drugs, or dietary supplements you use. Also tell them if you smoke, drink alcohol, or use illegal drugs. Some items may interact with your medicine. What should I watch for while using this medicine? Mild fever and pain should go away in 3 days or less. Report any unusual symptoms to your  doctor or health care professional. What side effects may I notice from receiving this medicine? Side effects that you should report to your doctor or health care professional as soon as possible: -allergic reactions like skin rash, itching or  hives, swelling of the face, lips, or tongue -breathing problems -confused -fever over 102 degrees F -pain, tingling, numbness in the hands or feet -seizures -unusual bleeding or bruising -unusual muscle weakness Side effects that usually do not require medical attention (report to your doctor or health care professional if they continue or are bothersome): -aches and pains -diarrhea -fever of 102 degrees F or less -headache -irritable -loss of appetite -pain, tender at site where injected -trouble sleeping This list may not describe all possible side effects. Call your doctor for medical advice about side effects. You may report side effects to FDA at 1-800-FDA-1088. Where should I keep my medicine? This does not apply. This vaccine is given in a clinic, pharmacy, doctor's office, or other health care setting and will not be stored at home. NOTE: This sheet is a summary. It may not cover all possible information. If you have questions about this medicine, talk to your doctor, pharmacist, or health care provider.  2015, Elsevier/Gold Standard. (2008-05-31 14:32:37) Influenza Virus Vaccine injection (Fluarix) What is this medicine? INFLUENZA VIRUS VACCINE (in floo EN zuh VAHY ruhs vak SEEN) helps to reduce the risk of getting influenza also known as the flu. This medicine may be used for other purposes; ask your health care provider or pharmacist if you have questions. COMMON BRAND NAME(S): Fluarix, Fluzone What should I tell my health care provider before I take this medicine? They need to know if you have any of these conditions: -bleeding disorder like hemophilia -fever or infection -Guillain-Barre syndrome or other neurological problems -immune system problems -infection with the human immunodeficiency virus (HIV) or AIDS -low blood platelet counts -multiple sclerosis -an unusual or allergic reaction to influenza virus vaccine, eggs, chicken proteins, latex, gentamicin, other  medicines, foods, dyes or preservatives -pregnant or trying to get pregnant -breast-feeding How should I use this medicine? This vaccine is for injection into a muscle. It is given by a health care professional. A copy of Vaccine Information Statements will be given before each vaccination. Read this sheet carefully each time. The sheet may change frequently. Talk to your pediatrician regarding the use of this medicine in children. Special care may be needed. Overdosage: If you think you have taken too much of this medicine contact a poison control center or emergency room at once. NOTE: This medicine is only for you. Do not share this medicine with others. What if I miss a dose? This does not apply. What may interact with this medicine? -chemotherapy or radiation therapy -medicines that lower your immune system like etanercept, anakinra, infliximab, and adalimumab -medicines that treat or prevent blood clots like warfarin -phenytoin -steroid medicines like prednisone or cortisone -theophylline -vaccines This list may not describe all possible interactions. Give your health care provider a list of all the medicines, herbs, non-prescription drugs, or dietary supplements you use. Also tell them if you smoke, drink alcohol, or use illegal drugs. Some items may interact with your medicine. What should I watch for while using this medicine? Report any side effects that do not go away within 3 days to your doctor or health care professional. Call your health care provider if any unusual symptoms occur within 6 weeks of receiving this vaccine. You may still catch the flu, but the  illness is not usually as bad. You cannot get the flu from the vaccine. The vaccine will not protect against colds or other illnesses that may cause fever. The vaccine is needed every year. What side effects may I notice from receiving this medicine? Side effects that you should report to your doctor or health care  professional as soon as possible: -allergic reactions like skin rash, itching or hives, swelling of the face, lips, or tongue Side effects that usually do not require medical attention (report to your doctor or health care professional if they continue or are bothersome): -fever -headache -muscle aches and pains -pain, tenderness, redness, or swelling at site where injected -weak or tired This list may not describe all possible side effects. Call your doctor for medical advice about side effects. You may report side effects to FDA at 1-800-FDA-1088. Where should I keep my medicine? This vaccine is only given in a clinic, pharmacy, doctor's office, or other health care setting and will not be stored at home. NOTE: This sheet is a summary. It may not cover all possible information. If you have questions about this medicine, talk to your doctor, pharmacist, or health care provider.  2015, Elsevier/Gold Standard. (2008-05-22 09:30:40)

## 2014-12-11 NOTE — Progress Notes (Signed)
Subjective:  patient is here for a number of things. First of all, an error was made when he came in, use given a Pneumovax rather than a flu shot. We had long discussion regarding that. He will be getting his flu shot.   he has a history of Crohn's disease. He wants blood work tested on this. He has not seen his gastroenterologist back for over a year. Says he needs referral back there. After reviewing the chart did not feel like this is an issue I can only manage here, and referred him back to the gastroenterologist. He needs a referral back to Dr. Fuller Plan  He has had some sleep problems. He needs a referral back to his neurologist, [name].   is not taking ADHD medication. He says it didn't seem to really help things.   Nevus on right cheek  rash intermittently in the back of his hair, on his chin, in the pubic hair area. Gets little blistery places.   Objective:  no apparent rash in hair on his beard today. There is a little on the right side of his scrotum where he is shaved it. Hard to tell much because he shaved and excoriated. Looks like a folliculitis. He has a little benign-appearing nevus in his right sideburn. It has some hair growing through it. He is alert and oriented. TMs normal. Throat clear. Neck supple without nodes. Chest clear. Heart regular without murmurs.   Assessment:  folliculitis  history of Crohn's His sleep disturbance   nevus on cheek  history  Possible attention deficit disorder   Plan: See instructions  Physical this summer

## 2014-12-12 ENCOUNTER — Ambulatory Visit: Payer: BC Managed Care – PPO | Admitting: Nurse Practitioner

## 2014-12-12 ENCOUNTER — Telehealth: Payer: Self-pay | Admitting: Gastroenterology

## 2014-12-12 DIAGNOSIS — K900 Celiac disease: Secondary | ICD-10-CM

## 2014-12-12 NOTE — Telephone Encounter (Signed)
Yes tTG, IgA

## 2014-12-12 NOTE — Telephone Encounter (Signed)
Patient notified He will come by in the next week or so

## 2014-12-12 NOTE — Telephone Encounter (Signed)
Letter was mailed to the patient on biopsy results from 10/2013.  He was to return for "celiac blood testing in 6 months".  He is now wanting to come for the lab work.  He has a follow up with you in April.  Please advise what labs you want TTG/IGA?

## 2014-12-13 ENCOUNTER — Telehealth: Payer: Self-pay

## 2014-12-13 NOTE — Telephone Encounter (Signed)
Dr Linna Darner, pt LM stating that the clindamycin gel that you Rxd is a Tier 4 med and very expensive. He asked if something else that would be less expensive could be sent in for him to use instead. He stated that the oral clindamycin is a tier 1 and you were hesitant to try it d/t celiac disease, but stated the research he has done does not indicate it would be a problem w/celiac (he stated that he is not the medical professional though and would let Dr Linna Darner determine advisability of use).

## 2014-12-13 NOTE — Telephone Encounter (Signed)
clindamycin (CLINDAGEL) 1 % gel  Wants to change the medication due to celiac disease.    [phone]

## 2014-12-15 ENCOUNTER — Encounter: Payer: Self-pay | Admitting: Family Medicine

## 2014-12-17 ENCOUNTER — Other Ambulatory Visit: Payer: Self-pay | Admitting: Family Medicine

## 2014-12-17 MED ORDER — ERYTHROMYCIN 2 % EX GEL: Freq: Two times a day (BID) | CUTANEOUS | Status: DC

## 2014-12-17 NOTE — Telephone Encounter (Signed)
Prescribe erythromycin topical gel , 30 gram , apply twice daily    2 RF

## 2014-12-17 NOTE — Telephone Encounter (Signed)
Can someone please address this in Dr Hopper's absence? Operators advised that he CB to check status of req. Please DISREGARD April's note about medication and read my details below. Pt also sent MyChart email to Dr Linna Darner.

## 2014-12-18 ENCOUNTER — Telehealth: Payer: Self-pay

## 2014-12-18 NOTE — Telephone Encounter (Signed)
It appears that this was already done.

## 2014-12-18 NOTE — Telephone Encounter (Signed)
[facility] on [address] is calling because a prescription was sent for ethromycin with ethinol and they do not have that medication. Please call [facility]! [phone]

## 2014-12-18 NOTE — Telephone Encounter (Signed)
Is there another gel we can prescribe for pt?

## 2014-12-19 NOTE — Telephone Encounter (Signed)
Okay to make the substitution

## 2014-12-19 NOTE — Telephone Encounter (Signed)
Pharm called back and asked again about this Rx. He advised that they have the ethromycin gel w/out ethinol in stock. Can we change to that, or do we need to wait for Dr Hopper's decision?

## 2014-12-20 NOTE — Telephone Encounter (Signed)
Spoke to pharmacist and gave OK to subst.

## 2015-01-09 ENCOUNTER — Encounter: Payer: Self-pay | Admitting: Nurse Practitioner

## 2015-01-09 ENCOUNTER — Ambulatory Visit (INDEPENDENT_AMBULATORY_CARE_PROVIDER_SITE_OTHER): Payer: 59 | Admitting: Nurse Practitioner

## 2015-01-09 VITALS — BP 123/75 | HR 59 | Ht 71.5 in | Wt 226.6 lb

## 2015-01-09 DIAGNOSIS — G4700 Insomnia, unspecified: Secondary | ICD-10-CM | POA: Diagnosis not present

## 2015-01-09 DIAGNOSIS — G2581 Restless legs syndrome: Secondary | ICD-10-CM

## 2015-01-09 DIAGNOSIS — F908 Attention-deficit hyperactivity disorder, other type: Secondary | ICD-10-CM

## 2015-01-09 DIAGNOSIS — F900 Attention-deficit hyperactivity disorder, predominantly inattentive type: Secondary | ICD-10-CM | POA: Diagnosis not present

## 2015-01-09 MED ORDER — ZOLPIDEM TARTRATE ER 6.25 MG PO TBCR: 6.2500 mg | EXTENDED_RELEASE_TABLET | Freq: Every evening | ORAL | Status: DC | PRN

## 2015-01-09 MED ORDER — GABAPENTIN 400 MG PO CAPS: 400.0000 mg | ORAL_CAPSULE | Freq: Every day | ORAL | Status: DC

## 2015-01-09 NOTE — Patient Instructions (Signed)
Increase gabapentin to 400 mg at bedtime for restless legs Ambien 6.5 when necessary for insomnia Follow-up in 6 months

## 2015-01-09 NOTE — Progress Notes (Signed)
[facility]  PATIENT: HEATHER STREEPER DOB: [birth year]   REASON FOR VISIT: Follow-up for ADHD and restless legs, chronic insomnia HISTORY FROM: Patient    HISTORY OF PRESENT ILLNESS: HISTORY: 10/26/13 [name] underwent a sleep study date 10-08-13 which resulted in no definite diagnosis for him. He was clearly having trouble initiating and maintaining sleep through the night and slept for less than 4 hours. Apnea was ruled out. He was evaluated by [name] 10/26/2013 in the office. However, he has no form of sleep apnea of any clinical significance, neither obstructive or central. He has no oxygen desaturations.He had only 11 periodic limb movements and have confirmed that his restless leg syndrome has greatly improved this medication. His heart rate during the night was slow and regular there was no sign of external distress of physiological stress on his body. Mr. Reesor has pretty much not been able to sleep for more than 5 or 6 hours at night- uninterrupted, not for the last 2 decades. He has ADHD and feels more focussed, but he is still hyperactive. Since he takes only 20 mg daily divided into doses, this may not be enough for his body mass index. In addition I will be happy to document for him but he clearly has nor sleep apnea at this time.  His insomnia is moderate ,further again, I felt there was no physiologic reason for him to wake up and have trouble to reinitiate sleep.  This night apparently was not that unrepresentative for his sleep at home , where he would frequently wake up in the early morning hours- This causing trouble to fall asleep again.  Patient is not aware of any reason to wake up in the middle of the night, has no urge to go to the bathroom, but does only because he is already of weeks to resolve.  Gabapentin has treated his restless leg sufficiently, 2 -3 hours before bedtime.  UPDATE 01/09/15: [name], 54 year old returns  to follow-up. He was last seen in this office by [name] 10/26/2013. He does not have  sleep apnea and restless legs have improved on gabapentin.The restless legs are quite a bit distressing to him.RLS begun 10 years ago progressive impairment.  His primary care increased to 400 mg when his prescription was renewed it was for  300 mg. He claims to 400 mg dose worked better. I will reorder. He also has history of  ADHD.  He was on Adderall when last seen by [name] but says the drug is ineffective and he has stopped it. He is asking for other suggestions. He had for years struggled to fall asleep, and had negative reaction Mirapex. He has gout and celiac disease. He had absorption problems with Iron and takes now a supplement. He has an active lifestyle , he drinks 10 alcoholic beverages a week, he has regular sleep habits. He drinks caffeine in AM 2-3 cups, Nothing after noon. No  REM  symptoms.   Ambien has improved his insomnia. He returns for reevaluation    REVIEW OF SYSTEMS: Full 14 system review of systems performed and notable only for those listed, all others are neg:  Constitutional: neg  Cardiovascular: neg Ear/Nose/Throat: neg  Skin: neg Eyes:Allergies Respiratory: neg Gastroitestinal: neg  Hematology/Lymphatic: neg  Endocrine: neg Musculoskeletal:neg Allergy/Immunology: Food allergies Neurological: neg Psychiatric: neg Sleep : neg   ALLERGIES: No Known Allergies  HOME MEDICATIONS: Outpatient Prescriptions Prior to Visit  Medication Sig Dispense Refill  . clindamycin (CLINDAGEL)  1 % gel Apply topically 2 (two) times daily. 30 g 2  . gabapentin (NEURONTIN) 300 MG capsule Take 1 capsule (300 mg total) by mouth at bedtime. 90 capsule 3  . amphetamine-dextroamphetamine (ADDERALL) 10 MG tablet Take 1 tablet (10 mg total) by mouth as directed. Take 2 a day in AM and one at lunch time. (Patient not taking: Reported on 12/11/2014) 90 tablet 0  . ferrous sulfate 325  (65 FE) MG tablet Take 325 mg by mouth daily. 2 times weekly    . fish oil-omega-3 fatty acids 1000 MG capsule Take 2 g by mouth daily.    Marland Kitchen zolpidem (AMBIEN CR) 6.25 MG CR tablet Take 1 tablet (6.25 mg total) by mouth at bedtime as needed for sleep. (Patient not taking: Reported on 01/09/2015) 30 tablet 3   No facility-administered medications prior to visit.    PAST MEDICAL HISTORY: Past Medical History  Diagnosis Date  . Sleep apnea syndrome     tested  under [facility],  12-2-- 2004  . RLS (restless legs syndrome)   . GERD (gastroesophageal reflux disease)     PAST SURGICAL HISTORY: Past Surgical History  Procedure Laterality Date  . Appendectomy    . Hernia repair      FAMILY HISTORY: Family History  Problem Relation Age of Onset  . Hypertension Mother   . Diabetes Mother   . Colon cancer Neg Hx   . Hypertension Father   . Heart disease Maternal Grandmother   . Heart disease Maternal Grandfather   . Heart disease Paternal Grandfather     SOCIAL HISTORY: History   Social History  . Marital Status: Married    Spouse Name: [name]  . Number of Children: 3  . Years of Education: N/A   Occupational History  . Not on file.   Social History Main Topics  . Smoking status: Never Smoker   . Smokeless tobacco: Never Used  . Alcohol Use: 3.5 oz/week    7 Standard drinks or equivalent per week  . Drug Use: No  . Sexual Activity: Yes   Other Topics Concern  . Not on file   Social History Narrative   Patient lives at home with wife [name].    Patient has 3 children.    Patient has a BA.      PHYSICAL EXAM  Filed Vitals:   01/09/15 1049  BP: 123/75  Pulse: 59  Height: 5' 11.5" (1.816 m)  Weight: 226 lb 9.6 oz (102.785 kg)   Body mass index is 31.17 kg/(m^2).  Generalized: Well developed, in no acute distress  Head: normocephalic and atraumatic,. Oropharynx benign  Neck: Supple, no carotid bruits  Cardiac: Regular rate rhythm, no murmur    Musculoskeletal: No deformity   Neurological examination   Mentation: Alert oriented to time, place, history taking. Attention span and concentration appropriate. Recent and remote memory intact.  Follows all commands speech and language fluent.   Cranial nerve II-XII: Fundoscopic exam  deferred.Pupils were equal round reactive to light extraocular movements were full, visual field were full on confrontational test. Facial sensation and strength were normal. hearing was intact to finger rubbing bilaterally. Uvula tongue midline. head turning and shoulder shrug were normal and symmetric.Tongue protrusion into cheek strength was normal. Motor: normal bulk and tone, full strength in the BUE, BLE, fine finger movements normal, no pronator drift. No focal weakness Sensory: normal and symmetric to light touch, pinprick, and  Vibration, proprioception  Coordination: finger-nose-finger, heel-to-shin  bilaterally, no dysmetria Reflexes: Brachioradialis 2/2, biceps 2/2, triceps 2/2, patellar 2/2, Achilles 2/2, plantar responses were flexor bilaterally. Gait and Station: Rising up from seated position without assistance, normal stance,  moderate stride, good arm swing, smooth turning, able to perform tiptoe, and heel walking without difficulty. Tandem gait is steady  DIAGNOSTIC DATA (LABS, IMAGING, TESTING) - ASSESSMENT AND PLAN  54 y.o. year old male  has a past medical history of insomnia , RLS (restless legs syndrome); and ADHD.   Increase gabapentin to 400 mg at bedtime for restless legs, will refill Ambien 6.5 when necessary for insomnia, will refill Given a list of several different medications for ADHD, he needs to check with his insurance company to see what is covered and call us back Follow-up in 6 months Dennie Bible, [facility], [facility], [facility] [address], [place], [place] [address] [phone]

## 2015-01-10 NOTE — Progress Notes (Signed)
I agree with the assessment and plan as directed by NP .The patient is known to me .   Willim Turnage, MD  

## 2015-01-13 ENCOUNTER — Telehealth: Payer: Self-pay | Admitting: *Deleted

## 2015-01-13 DIAGNOSIS — G4710 Hypersomnia, unspecified: Secondary | ICD-10-CM

## 2015-01-13 DIAGNOSIS — F901 Attention-deficit hyperactivity disorder, predominantly hyperactive type: Secondary | ICD-10-CM

## 2015-01-13 NOTE — Telephone Encounter (Signed)
Patient calling stating that the pharmacy has not received the Ambien Rx please resend. And METADATE 20 MG control release is the ADHD med he wants to try that would be covered by ins. Aaron [name]

## 2015-01-13 NOTE — Telephone Encounter (Signed)
Per previous portion of this note, patient is requesting to change from Adderall to Metadate 63m for ADHD, as this is preferred on his ins plan.  This is a CII.  Would you like to change Rx?  Please advise.  Thank you.   I called the pharmacy to follow up on Ambien Rx.  They did get the Rx, however, it is not covered by ins, and requires a prior auth.  I have contacted ins and provided clinical info.  Request is currently under review.

## 2015-01-13 NOTE — Telephone Encounter (Signed)
Jessica the rx for Ambien was sent. Please check with the pharmacy. Also the Metadate is C2. Thanks for taking care of this

## 2015-01-14 ENCOUNTER — Telehealth: Payer: Self-pay

## 2015-01-14 MED ORDER — METHYLPHENIDATE HCL 20 MG PO TABS: 20.0000 mg | ORAL_TABLET | Freq: Two times a day (BID) | ORAL | Status: DC

## 2015-01-14 NOTE — Telephone Encounter (Signed)
No problem to change to ritalin.

## 2015-01-14 NOTE — Telephone Encounter (Signed)
[employer] [facility]) has approved the request for coverage on Zolpidem effective until 01/14/2016 Ref # [id number]

## 2015-01-16 ENCOUNTER — Telehealth: Payer: Self-pay

## 2015-01-16 DIAGNOSIS — G2581 Restless legs syndrome: Secondary | ICD-10-CM

## 2015-01-16 DIAGNOSIS — G4701 Insomnia due to medical condition: Principal | ICD-10-CM

## 2015-01-16 NOTE — Telephone Encounter (Signed)
Dr Dohmeier, his particular policy prefers regular Zolpidem, Zaleplon and/or Eszopiclone.  Let me know if you need anything else!

## 2015-01-16 NOTE — Telephone Encounter (Signed)
The cheapest alternatives tend to be the most addictive - XANAX, Restoril, valium, ativan.. The covered alternatives could be LUNESTA  Or SONATA. I will ask Janett Billow to find out , what's covered CD

## 2015-01-16 NOTE — Telephone Encounter (Signed)
Called patient and informed Rx ready for pick up at front desk. Patient verbalized understanding.

## 2015-01-16 NOTE — Telephone Encounter (Signed)
Patient is requesting a good alternative for Zolpidem if possible,  because it is at a Tier 4 drug and very expensive for him.

## 2015-01-17 ENCOUNTER — Telehealth: Payer: Self-pay

## 2015-01-17 MED ORDER — ZALEPLON 10 MG PO CAPS: 10.0000 mg | ORAL_CAPSULE | Freq: Every evening | ORAL | Status: DC | PRN

## 2015-01-17 NOTE — Telephone Encounter (Signed)
[employer] [facility]) has approved the request for coverage on Methylphenidate effective until 01/17/2016 Ref # [id number]

## 2015-01-17 NOTE — Telephone Encounter (Signed)
Lets try Zaleplon- 2 mg at night , prescribed 30 .

## 2015-01-17 NOTE — Telephone Encounter (Signed)
I called the patient to advise.  Got no answer.  Left message.

## 2015-02-17 ENCOUNTER — Other Ambulatory Visit: Payer: Self-pay | Admitting: Neurology

## 2015-02-17 ENCOUNTER — Telehealth: Payer: Self-pay

## 2015-02-17 DIAGNOSIS — F901 Attention-deficit hyperactivity disorder, predominantly hyperactive type: Secondary | ICD-10-CM

## 2015-02-17 DIAGNOSIS — G4710 Hypersomnia, unspecified: Secondary | ICD-10-CM

## 2015-02-17 MED ORDER — METHYLPHENIDATE HCL 20 MG PO TABS: 20.0000 mg | ORAL_TABLET | Freq: Two times a day (BID) | ORAL | Status: DC

## 2015-02-17 NOTE — Telephone Encounter (Signed)
Patient is calling to get written Rx methylphenidate 20 mg.  Please call.

## 2015-02-17 NOTE — Telephone Encounter (Signed)
Called patient to inform Rx ready for pick up at front desk. No answer. Left vmail on HP# per DPR.

## 2015-02-17 NOTE — Telephone Encounter (Signed)
Request entered, forwarded to [facility] for approval because Dr Brett Fairy is out of the office.

## 2015-02-20 ENCOUNTER — Other Ambulatory Visit (INDEPENDENT_AMBULATORY_CARE_PROVIDER_SITE_OTHER): Payer: 59

## 2015-02-20 DIAGNOSIS — K900 Celiac disease: Secondary | ICD-10-CM

## 2015-02-20 LAB — IGA: IgA: 262 mg/dL (ref 68–378)

## 2015-02-21 ENCOUNTER — Other Ambulatory Visit: Payer: Self-pay | Admitting: Gastroenterology

## 2015-02-24 LAB — TISSUE TRANSGLUTAMINASE, IGA: Tissue Transglutaminase Ab, IgA: 100 U/mL — ABNORMAL HIGH (ref <4)

## 2015-02-26 ENCOUNTER — Ambulatory Visit (INDEPENDENT_AMBULATORY_CARE_PROVIDER_SITE_OTHER): Payer: 59 | Admitting: Gastroenterology

## 2015-02-26 ENCOUNTER — Encounter: Payer: Self-pay | Admitting: Gastroenterology

## 2015-02-26 VITALS — BP 136/70 | HR 64 | Ht 71.25 in | Wt 220.1 lb

## 2015-02-26 DIAGNOSIS — K900 Celiac disease: Secondary | ICD-10-CM

## 2015-02-26 NOTE — Patient Instructions (Signed)
We will be in touch to when we schedule your appointment with the Celiac disease specialist at [facility].   Thank you for choosing me and [facility].  Pricilla Riffle. Dagoberto Ligas., MD., Marval Regal

## 2015-02-26 NOTE — Progress Notes (Signed)
    History of Present Illness: This is a 54 year old male with a history of celiac disease. He returns for follow-up today. Blood work performed last week showed a markedly elevated tTG IgA titer at 100. Five years ago titer was 128. Duodenal biopsies on 10/2013 EGD showed changes of celiac disease with severe villous blunting and increased plasma cells in the lamina propria and intraepithelial lymphocytes. He has no GI complaints. His stools are well formed. Appetite good. Weight stable. At initial presentation he had diarrhea which resolved after beginning a gluten-free diet.  Current Medications, Allergies, Past Medical History, Past Surgical History, Family History and Social History were reviewed in [employer] record.  Physical Exam: General: Well developed , well nourished, no acute distress Head: Normocephalic and atraumatic Eyes:  sclerae anicteric, EOMI Ears: Normal auditory acuity Mouth: No deformity or lesions Lungs: Clear throughout to auscultation Heart: Regular rate and rhythm; no murmurs, rubs or bruits Abdomen: Soft, non tender and non distended. No masses, hepatosplenomegaly or hernias noted. Normal Bowel sounds Musculoskeletal: Symmetrical with no gross deformities  Pulses:  Normal pulses noted Extremities: No clubbing, cyanosis, edema or deformities noted Neurological: Alert oriented x 4, grossly nonfocal Psychological:  Alert and cooperative. Normal mood and affect  Assessment and Recommendations:  1. Celiac disease. Asymptomatic. Not well controlled based on tTG titer greater than 100 and findings on duodenal biopsies December 2014. He does not feel he is taking any gluten in his diet except for maybe occasional bread crumbs on foods. We discussed the risks of ongoing inflammation including the possibility of small bowel T-cell lymphomas. Referral to Dr. Lucienne Capers at [facility]. Okay to use clindamycin or any other antibiotics. His risk of antibiotic  associated diarrhea or C. difficile is not different than the average person. His TTG should be followed every 6 months until it normalizes. Await further recommendations from Dr. Marissa Nestle.  I spent 15 minutes of face-to-face time with the patient. Over 50% of time was spent counseling and coordinating care.

## 2015-02-27 ENCOUNTER — Telehealth: Payer: Self-pay | Admitting: Gastroenterology

## 2015-02-27 NOTE — Telephone Encounter (Signed)
Left message for pt to call back  °

## 2015-02-27 NOTE — Telephone Encounter (Signed)
Spoke with pt, gave him the name of the physician he is being referred to. Pt instructed to call his insurance company to make sure they are in network.

## 2015-02-28 ENCOUNTER — Telehealth: Payer: Self-pay

## 2015-02-28 NOTE — Telephone Encounter (Addendum)
Spoke with Janett Billow at [facility] regarding patient's appt with Dr. Marissa Nestle.Dr. Marissa Nestle has accepted to see patient per Janett Billow. She states Dr. Marissa Nestle wants to review patient's records and also wants patient to see a nutrionist at [facility]). Janett Billow told me that she will contact me as soon as Dr. Marissa Nestle reviews records I faxed yesterday with an appt date and time. Left a message on patient's voicemail that we are working on this referral and will contact him as soon as hear back from [place].

## 2015-03-03 NOTE — Telephone Encounter (Signed)
Patient called wanting a update on the referral we placed to Dr. Marissa Nestle. Informed patient I am waiting on Dr. Jesse Sans office to contact me back with an appt date and time once they have reviewed the records. Patient states he did some research and as far as he can tell [facility] is covered with his insurance company wants to know if they pre-cert also. Told patient I can ask Janett Billow once she calls me with an appt date and time. Pt also states he wants to pick his appt date and time. I told patient that when Janett Billow from Dr. Jesse Sans office returns my call, I will just have her contact him directly. Pt agreed.

## 2015-03-10 NOTE — Telephone Encounter (Signed)
Called Janett Billow to ask if she has an appt date and time for patient yet. Janett Billow states there is a note in the system that they never received records. Smitty Knudsen that I sent them to fax number she gave me when we ordinally spoke on 02/27/15. She apologized and asked for them to be re-faxed. Faxed records again to fax number she gave me [phone]). Janett Billow states to check back again tomorrow or Wednesday on appt.

## 2015-03-13 NOTE — Telephone Encounter (Signed)
Left Janett Billow a message at [facility] to get status update on patient's appt with Dr. Marissa Nestle and to see if they have finally reviewed the records. Told her to call me back today.

## 2015-03-14 NOTE — Telephone Encounter (Signed)
Leave a voicemail for Maudie Mercury which is who according to Janett Billow will be taking over the referral to Dr. Marissa Nestle stating we need a status update on patient's appt. Cyril Mourning that answered the phone did state that Maudie Mercury will be out of the office until Monday. Told her I will call on Monday.

## 2015-03-18 ENCOUNTER — Telehealth: Payer: Self-pay

## 2015-03-18 ENCOUNTER — Other Ambulatory Visit: Payer: Self-pay | Admitting: Neurology

## 2015-03-18 DIAGNOSIS — G4710 Hypersomnia, unspecified: Secondary | ICD-10-CM

## 2015-03-18 DIAGNOSIS — F901 Attention-deficit hyperactivity disorder, predominantly hyperactive type: Secondary | ICD-10-CM

## 2015-03-18 MED ORDER — METHYLPHENIDATE HCL 20 MG PO TABS: 20.0000 mg | ORAL_TABLET | Freq: Two times a day (BID) | ORAL | Status: DC

## 2015-03-18 NOTE — Telephone Encounter (Signed)
Request entered, forwarded to provider for approval.  

## 2015-03-18 NOTE — Telephone Encounter (Signed)
Left message on mobile that pt's RX is available for pick up.

## 2015-03-18 NOTE — Telephone Encounter (Signed)
Patient called and requested a refill on Rx. methylphenidate (RITALIN) 20 MG tablet. Please call and advise.

## 2015-03-19 ENCOUNTER — Telehealth: Payer: Self-pay | Admitting: Neurology

## 2015-03-19 NOTE — Telephone Encounter (Signed)
Never received a returned call from [place]. Called again and left another voicemail for Aaron [name] to return my call so we can get this appointment for this patient. Left a voicemail stating that I left a message and she never returned my call and she needs to call me back as soon as possible.

## 2015-03-19 NOTE — Telephone Encounter (Signed)
Patient requesting refill of Ritalin 20 mg (generic). Patient was told it is ready for him to pick up at the front desk. He said he would stop by today to get it.

## 2015-03-19 NOTE — Telephone Encounter (Signed)
Aaron [name] called back and left a message stating to fax to two other numbers. Faxed records to both number she gave me. She called me and told me she has received the records and will hand them Dr. Marissa Nestle to review. I asked Aaron [name] if she would call me back once Dr. Marissa Nestle has reviewed them so we can set up an appt date and time and Aaron [name] agreed.

## 2015-03-25 NOTE — Telephone Encounter (Signed)
Called Lasandra Beech at Dr. Jesse Sans office to check on status of referral appt. Maudie Mercury states she did put the records in her office with a note on them to review but she has not been in the office really to review them. Maudie Mercury states she will be in the office tomorrow and she will remind her to review them. Maudie Mercury states she will call me this week with an appt date and time.

## 2015-03-27 NOTE — Telephone Encounter (Signed)
Maudie Mercury return my call and gave me an appt for 04/02/15 at 10:40am at [facility] which is located at [place], [place]. Left a message for patient to return my call.

## 2015-03-28 NOTE — Telephone Encounter (Signed)
Informed patient of appt date and time. Pt states that the appt date will not work for him. Told patient he can call [facility] and ask for Kim to reschedule this appt. Gave patient the phone number to the [facility] GI department. Pt verbalized understanding that he will contact them to change the appt date and time.

## 2015-03-31 ENCOUNTER — Telehealth: Payer: Self-pay

## 2015-03-31 NOTE — Telephone Encounter (Signed)
Dr Linna Darner- Pt wanting to change from cream to oral med.

## 2015-03-31 NOTE — Telephone Encounter (Signed)
Patient needs to be rechecked to decide if something needs to be done differently. He has not been here for several months. I do not know what oral medication some other doctors recommending, and the patient can ask that Dr. to do the prescription. Otherwise he needs to come back here and get reassessed.

## 2015-03-31 NOTE — Telephone Encounter (Signed)
Pt is needing to talk with dr hopper about changing his medication from a cream to oral med for a rash-he states that dr Lynne Leader office aprroves this changed

## 2015-04-01 NOTE — Telephone Encounter (Signed)
Pt called back explaining he saw you about this in February about this Rx clindamycin (CLINDAGEL) 1 % gel [[id number]] He states the medication is not working and would like the oral medication instead. He said you were concerned about the pills and his celiac disease. He went to see Dr. Fuller Plan for his GI issues and he said it is ok for pt to take the pills so he is just trying to get a new Rx. Notes from GI in file.

## 2015-04-01 NOTE — Telephone Encounter (Signed)
Left message on voicemail letting him know he has to RTC.

## 2015-04-03 NOTE — Telephone Encounter (Signed)
Can he be referred to dermatologist?

## 2015-04-03 NOTE — Telephone Encounter (Signed)
Patient needs rechecked before deciding if other treatment is indicated.

## 2015-04-03 NOTE — Telephone Encounter (Signed)
Called pt to let him know that he needs to RTC. Pt understood.

## 2015-04-04 ENCOUNTER — Other Ambulatory Visit: Payer: Self-pay | Admitting: Radiology

## 2015-04-04 DIAGNOSIS — L739 Follicular disorder, unspecified: Secondary | ICD-10-CM

## 2015-04-04 NOTE — Telephone Encounter (Signed)
Okay to refer to dermatology

## 2015-04-04 NOTE — Telephone Encounter (Signed)
Will make referral to derm.

## 2015-04-15 ENCOUNTER — Telehealth: Payer: Self-pay

## 2015-04-15 ENCOUNTER — Other Ambulatory Visit: Payer: Self-pay | Admitting: Neurology

## 2015-04-15 DIAGNOSIS — G4710 Hypersomnia, unspecified: Secondary | ICD-10-CM

## 2015-04-15 DIAGNOSIS — F901 Attention-deficit hyperactivity disorder, predominantly hyperactive type: Secondary | ICD-10-CM

## 2015-04-15 MED ORDER — METHYLPHENIDATE HCL 20 MG PO TABS: 20.0000 mg | ORAL_TABLET | Freq: Two times a day (BID) | ORAL | Status: DC

## 2015-04-15 NOTE — Telephone Encounter (Signed)
Spoke to pt's wife, informed her that pt's rx was ready for pick up at the front desk.

## 2015-04-15 NOTE — Telephone Encounter (Signed)
Patient called requesting a refill for methylphenidate (RITALIN) 20 MG tablet.  Patient advised RX will be ready within 24 hours unless hears otherwise from RN. Patient can be reached at [phone].

## 2015-04-15 NOTE — Telephone Encounter (Signed)
Request entered, forwarded to provider for approval.  

## 2015-05-16 ENCOUNTER — Other Ambulatory Visit: Payer: Self-pay | Admitting: Neurology

## 2015-05-16 DIAGNOSIS — F901 Attention-deficit hyperactivity disorder, predominantly hyperactive type: Secondary | ICD-10-CM

## 2015-05-16 DIAGNOSIS — G4710 Hypersomnia, unspecified: Secondary | ICD-10-CM

## 2015-05-16 MED ORDER — METHYLPHENIDATE HCL 20 MG PO TABS: 20.0000 mg | ORAL_TABLET | Freq: Two times a day (BID) | ORAL | Status: DC

## 2015-05-16 NOTE — Telephone Encounter (Signed)
Dr Brett Fairy is out of the office.  Request entered, forwarded to [facility] for approval.

## 2015-05-16 NOTE — Telephone Encounter (Signed)
Patient called requesting a refill on Rx. methylphenidate (RITALIN) 20 MG tablet. Informed him it would be ready within 24 hours.

## 2015-05-19 ENCOUNTER — Other Ambulatory Visit: Payer: Self-pay

## 2015-05-19 DIAGNOSIS — G4710 Hypersomnia, unspecified: Secondary | ICD-10-CM

## 2015-05-19 DIAGNOSIS — G2581 Restless legs syndrome: Secondary | ICD-10-CM

## 2015-05-19 DIAGNOSIS — F901 Attention-deficit hyperactivity disorder, predominantly hyperactive type: Secondary | ICD-10-CM

## 2015-05-19 DIAGNOSIS — G4701 Insomnia due to medical condition: Principal | ICD-10-CM

## 2015-05-19 MED ORDER — ZALEPLON 10 MG PO CAPS: 10.0000 mg | ORAL_CAPSULE | Freq: Every evening | ORAL | Status: DC | PRN

## 2015-05-19 MED ORDER — METHYLPHENIDATE HCL 20 MG PO TABS: 20.0000 mg | ORAL_TABLET | Freq: Two times a day (BID) | ORAL | Status: DC

## 2015-05-19 NOTE — Telephone Encounter (Signed)
Rx signed and faxed.

## 2015-05-19 NOTE — Telephone Encounter (Signed)
[facility] did approve this Rx last week while Dr Brett Fairy was out of the office, however, they did not sign the written Rx, and they are out of the office this week.  For this reason Rx has been re-entered for approval.  Unsigned Rx's will be shredded.

## 2015-05-20 ENCOUNTER — Telehealth: Payer: Self-pay

## 2015-05-20 NOTE — Telephone Encounter (Signed)
Spoke to pt about his ritalin being ready for pick up at the front desk.  He verbalized understanding.

## 2015-06-16 ENCOUNTER — Other Ambulatory Visit: Payer: Self-pay | Admitting: Neurology

## 2015-06-16 ENCOUNTER — Telehealth: Payer: Self-pay

## 2015-06-16 DIAGNOSIS — G4710 Hypersomnia, unspecified: Secondary | ICD-10-CM

## 2015-06-16 DIAGNOSIS — F901 Attention-deficit hyperactivity disorder, predominantly hyperactive type: Secondary | ICD-10-CM

## 2015-06-16 MED ORDER — METHYLPHENIDATE HCL 20 MG PO TABS: 20.0000 mg | ORAL_TABLET | Freq: Two times a day (BID) | ORAL | Status: DC

## 2015-06-16 NOTE — Telephone Encounter (Signed)
Ritalin Rx entered, forwarded to provider for review.  It appears [employer] was approved last month with 3 additional refills.  Patient would need to contact pharmacy to request a refill on this med.  I called the patient back.  Got no answer.  Left message.

## 2015-06-16 NOTE — Telephone Encounter (Signed)
Left a message for pt that his RX is available at the front desk for pick up.

## 2015-06-16 NOTE — Telephone Encounter (Signed)
Patient called requesting refill for zaleplon (SONATA) 10 MG capsule and methylphenidate (RITALIN) 20 MG tablet . Patient advised RX will be ready within 24 hours unless otherwise informed by RN.

## 2015-06-20 ENCOUNTER — Other Ambulatory Visit: Payer: Self-pay

## 2015-07-16 ENCOUNTER — Ambulatory Visit: Payer: 59 | Admitting: Neurology

## 2015-07-17 ENCOUNTER — Other Ambulatory Visit: Payer: Self-pay | Admitting: Neurology

## 2015-07-17 ENCOUNTER — Telehealth: Payer: Self-pay

## 2015-07-17 DIAGNOSIS — G4710 Hypersomnia, unspecified: Secondary | ICD-10-CM

## 2015-07-17 DIAGNOSIS — F901 Attention-deficit hyperactivity disorder, predominantly hyperactive type: Secondary | ICD-10-CM

## 2015-07-17 MED ORDER — METHYLPHENIDATE HCL 20 MG PO TABS: 20.0000 mg | ORAL_TABLET | Freq: Two times a day (BID) | ORAL | Status: DC

## 2015-07-17 NOTE — Telephone Encounter (Signed)
Patient called to request refill on methylphenidate (RITALIN) 20 MG tablet

## 2015-07-17 NOTE — Telephone Encounter (Signed)
Request entered, forwarded to provider for approval.  

## 2015-07-17 NOTE — Telephone Encounter (Signed)
Called pt, left vmail. Adderal rx ready for pick up at front desk.

## 2015-08-14 ENCOUNTER — Other Ambulatory Visit: Payer: Self-pay | Admitting: Neurology

## 2015-08-14 ENCOUNTER — Telehealth: Payer: Self-pay

## 2015-08-14 DIAGNOSIS — G4710 Hypersomnia, unspecified: Secondary | ICD-10-CM

## 2015-08-14 DIAGNOSIS — F901 Attention-deficit hyperactivity disorder, predominantly hyperactive type: Secondary | ICD-10-CM

## 2015-08-14 MED ORDER — METHYLPHENIDATE HCL 20 MG PO TABS: 20.0000 mg | ORAL_TABLET | Freq: Two times a day (BID) | ORAL | Status: DC

## 2015-08-14 NOTE — Telephone Encounter (Signed)
Dr Brett Fairy is out of the office.  Request entered, forwarded to [facility] for review.

## 2015-08-14 NOTE — Telephone Encounter (Signed)
Patient is calling to get a written Rx for methylphenidate (RITALIN) 20 MG tablet. I advised the Rx will be ready in 24 hours unless otherwise advised by the nurse. Thank you.

## 2015-08-14 NOTE — Telephone Encounter (Signed)
Rx ready for pick up. 

## 2015-08-25 DIAGNOSIS — D509 Iron deficiency anemia, unspecified: Secondary | ICD-10-CM | POA: Insufficient documentation

## 2015-08-26 ENCOUNTER — Other Ambulatory Visit: Payer: Self-pay

## 2015-08-28 ENCOUNTER — Other Ambulatory Visit: Payer: Self-pay

## 2015-08-28 ENCOUNTER — Encounter: Payer: Self-pay | Admitting: Neurology

## 2015-08-28 ENCOUNTER — Ambulatory Visit (INDEPENDENT_AMBULATORY_CARE_PROVIDER_SITE_OTHER): Payer: 59 | Admitting: Neurology

## 2015-08-28 VITALS — BP 126/86 | HR 88 | Resp 20 | Ht 72.0 in | Wt 218.0 lb

## 2015-08-28 DIAGNOSIS — K900 Celiac disease: Secondary | ICD-10-CM | POA: Diagnosis not present

## 2015-08-28 DIAGNOSIS — F901 Attention-deficit hyperactivity disorder, predominantly hyperactive type: Secondary | ICD-10-CM

## 2015-08-28 DIAGNOSIS — G2581 Restless legs syndrome: Secondary | ICD-10-CM

## 2015-08-28 MED ORDER — TRAZODONE HCL 50 MG PO TABS: 50.0000 mg | ORAL_TABLET | Freq: Every day | ORAL | Status: DC

## 2015-08-28 NOTE — Progress Notes (Signed)
SLEEP MEDICINE CLINIC   Provider:  Larey Seat, M D  Referring Provider: Posey Boyer, MD Primary Care Physician:  Ruben Reason, MD  Chief Complaint  Patient presents with  . Follow-up    insomnia, RLS, doesn't sleep well, rm 10, alone    HPI:  Aaron [name] is a 54 y.o. male , seen here as a revisit  from Dr. Linna Darner , following up on a sleep study from 2014 , diagnosed with RLS, insomnia,  I have personally last seen Aaron [name] in 2014 after a diagnosed him with restless leg syndrome related insomnia and had explained to him that his apnea was so mild that CPAP intervention was not justified. In the meantime he has followed up for his celiac disease with a specialist at [facility] in [place], Dr. Marissa Nestle.  He has a family history of psoriasis, he was only diagnosed with celiac disease at age 47. He is unaware of a family history of celiac, Crohn's or any colitis. He is concerned about malabsorption. He used to take po Iron, but stopped after reading " it's bad for older folks" .  His son was diagnosed with IBS, negative celiac testing. He has sleeping problems, too.   Sleep habits are as follows:  His usual bedtime is around midnight, he only sleeps about 2-3 hours on block and then wakes up. His restless legs do no longer bother him that much that he couldn't initiate sleep. He does wake up in the middle of the night however with kicking and he is aware of his mild sleep apnea. Usually he tries to distract himself a little bit watches TV or reads until he is able to fall asleep again. He will get another 2 or 3 hours of sleep and usually sleeps better in the morning hours. He uses Gabapentin for RLS, 400 mg qhs.   Sleep medical history and family sleep history: son has insomnia.  Social history: Patient is not a smoker no tobacco use in any form, 2 mixed drinks a day on average, he drinks coffee only in the morning 2-3 cups a day. No soda, tea is caffeine free  Review  of Systems: Out of a complete 14 system review, the patient complains of only the following symptoms, and all other reviewed systems are negative. Hyperactivity, better focus on ritalin.  Did not tolerate adderall.   Epworth score 12 , Fatigue severity score 40  , depression score 2, endorses an irresistible urge to move in the late afternoon and evening hours but more so if he was physically very active in daytime before.  [facility] restless leg syndrome quality of life questionnaire:  the patient endorsed a moderate impairment over the last 4 weeks;  restless legs doesn't do not keep him from attending social activities but they trouble him more concentrated in the afternoon and the evening and sometimes also affect his ability to get up in the morning.   Social History   Social History  . Marital Status: Married    Spouse Name: [name]  . Number of Children: 3  . Years of Education: N/A   Occupational History  . Manager    Social History Main Topics  . Smoking status: Never Smoker   . Smokeless tobacco: Never Used  . Alcohol Use: 4.2 oz/week    7 Standard drinks or equivalent per week     Comment: Occassionally  . Drug Use: No  . Sexual Activity: Yes   Other Topics Concern  .  Not on file   Social History Narrative   Patient lives at home with wife [name].    Patient has 3 children.    Patient has a BA.     Family History  Problem Relation Age of Onset  . Hypertension Mother   . Diabetes Mother   . Colon cancer Neg Hx   . Hypertension Father   . Heart disease Maternal Grandmother   . Heart disease Maternal Grandfather   . Heart disease Paternal Grandfather   . Colon polyps Neg Hx   . Kidney disease Neg Hx   . Esophageal cancer Neg Hx     Past Medical History  Diagnosis Date  . Sleep apnea syndrome     tested  under [facility],  12-2-- 2004  . RLS (restless legs syndrome)   . GERD (gastroesophageal reflux disease)   . Celiac disease     Past  Surgical History  Procedure Laterality Date  . Appendectomy    . Hernia repair      Current Outpatient Prescriptions  Medication Sig Dispense Refill  . Doxycycline Hyclate (ACTICLATE) 150 MG TABS Take 150 mg by mouth.    . ferrous sulfate 325 (65 FE) MG tablet Take 325 mg by mouth daily. 2 times weekly    . fish oil-omega-3 fatty acids 1000 MG capsule Take 2 g by mouth daily.    . fluocinonide (LIDEX) 0.05 % external solution     . gabapentin (NEURONTIN) 400 MG capsule Take 1 capsule (400 mg total) by mouth at bedtime. 90 capsule 2  . hydrocortisone 2.5 % cream     . methylphenidate (RITALIN) 20 MG tablet Take 1 tablet (20 mg total) by mouth 2 (two) times daily. 60 tablet 0  . zaleplon (SONATA) 10 MG capsule Take 1 capsule (10 mg total) by mouth at bedtime as needed for sleep. 30 capsule 3  . zolpidem (AMBIEN CR) 6.25 MG CR tablet Take 1 tablet (6.25 mg total) by mouth at bedtime as needed for sleep. (Patient not taking: Reported on 08/28/2015) 30 tablet 3   No current facility-administered medications for this visit.    Allergies as of 08/28/2015  . (No Known Allergies)    Vitals: BP 126/86 mmHg  Pulse 88  Resp 20  Ht 6' (1.829 m)  Wt 218 lb (98.884 kg)  BMI 29.56 kg/m2 Last Weight:  Wt Readings from Last 1 Encounters:  08/28/15 218 lb (98.884 kg)   ONG:EXBM mass index is 29.56 kg/(m^2).     Last Height:   Ht Readings from Last 1 Encounters:  08/28/15 6' (1.829 m)    Physical exam:  General: The patient is awake, alert and appears not in acute distress. The patient is well groomed. Head: Normocephalic, atraumatic. Neck is supple. Mallampati 3 ,  neck circumference: 15.5. Nasal airflow unrestricition , neither TMJ nor Retrognathia is seen.  Cardiovascular:  Regular rate and rhythm without  murmurs or carotid bruit, and without distended neck veins. Respiratory: Lungs are clear to auscultation. Skin:  Without evidence of edema, bluish discoloration of the feet. Toes.    Trunk: BMI is 30 The patient's posture is erect.  Neurologic exam : The patient is awake and alert, oriented to place and time.   Memory subjective  described as intact. Attention span & concentration ability appears normal.  Speech is fluent,  withoutdysarthria, dysphonia or aphasia.  Mood and affect are appropriate.  Cranial nerves: Pupils are equal and briskly reactive to light. Funduscopic  exam without  evidence of pallor or edema. Extraocular movements  in vertical and horizontal planes intact and without nystagmus. Visual fields by finger perimetry are intact. Hearing to finger rub intact.   Facial sensation intact to fine touch.  Facial motor strength is symmetric and tongue and uvula move midline. Shoulder shrug was symmetrical.   Motor exam:   Normal tone, muscle bulk and symmetric strength in all extremities.  Sensory:  Fine touch, pinprick and vibration were tested in all extremities. Proprioception tested in the upper extremities was normal.  Coordination: Rapid alternating movements in the fingers/hands was normal. Finger-to-nose maneuver  normal without evidence of ataxia, dysmetria or tremor.  Gait and station: Patient walks without assistive device and is able unassisted to climb up to the exam table. Strength within normal limits.  Stance is stable and normal.  Toe and heel stand were tested .Tandem gait is unfragmented. Turns with 3  Steps. Romberg testing is  negative.  Deep tendon reflexes: in the  upper and lower extremities are symmetric and intact. Babinski maneuver response is  downgoing.  The patient was advised of the nature of the diagnosed sleep disorder , the treatment options and risks for general a health and wellness arising from not treating the condition.  I spent more than 30 minutes of face to face time with the patient. Greater than 50% of time was spent in counseling and coordination of care. We have discussed the diagnosis and differential and I  answered the patient's questions.     Assessment:  After physical and neurologic examination, review of laboratory studies,  Personal review of imaging studies, reports of other /same  Imaging studies ,  Results of polysomnography/ neurophysiology testing and pre-existing records as far as provided in visit., my assessment is positive for  RLS .   1) No neuropathy.   2) RLS - irresistible urge to move.   3) ADHD , adult form, he has continued Ritalin .    Plan:  Treatment plan and additional workup :  The patient discontinued oral iron a while back so he is not currently on an oral iron supplements, he is on Neurontin for the treatment of restless legs, he does not have neuropathic findings nor spinal abnormalities in form of lacking or brisk reflexes. There is no clonus Babinski is downgoing.  I am not certain if Ritalin could also aggravate the patient's insomnia as a stimulant medication but it hasn't benefited him giving him focus. I will increase the Neurontin dose for the night from 400 to 600 mg at this level there is also an extended release form available.  To enroll in our restless leg trial the patient would have to be 8 weeks on a stable medication dose before able to enroll. He decided to wait with the increasing in dose. I will speak to Reginia Forts about his enrollment.  The Gluten free diet has treated his celiac disease.        Asencion Partridge Chatsworth Wenzlick MD  08/28/2015   CC: Posey Boyer, [place],  [phone]

## 2015-08-28 NOTE — Addendum Note (Signed)
Addended by: Lester  A on: 08/28/2015 04:37 PM   Modules accepted: Orders

## 2015-08-29 LAB — CBC WITH DIFFERENTIAL/PLATELET
Basophils Absolute: 0 10*3/uL (ref 0.0–0.2)
Basos: 0 %
EOS (ABSOLUTE): 0.1 10*3/uL (ref 0.0–0.4)
Eos: 2 %
Hematocrit: 43.4 % (ref 37.5–51.0)
Hemoglobin: 14.6 g/dL (ref 12.6–17.7)
Immature Grans (Abs): 0 10*3/uL (ref 0.0–0.1)
Immature Granulocytes: 0 %
Lymphocytes Absolute: 2.9 10*3/uL (ref 0.7–3.1)
Lymphs: 41 %
MCH: 30.7 pg (ref 26.6–33.0)
MCHC: 33.6 g/dL (ref 31.5–35.7)
MCV: 91 fL (ref 79–97)
Monocytes Absolute: 0.5 10*3/uL (ref 0.1–0.9)
Monocytes: 7 %
Neutrophils Absolute: 3.5 10*3/uL (ref 1.4–7.0)
Neutrophils: 50 %
Platelets: 230 10*3/uL (ref 150–379)
RBC: 4.75 x10E6/uL (ref 4.14–5.80)
RDW: 13.2 % (ref 12.3–15.4)
WBC: 7 10*3/uL (ref 3.4–10.8)

## 2015-09-01 ENCOUNTER — Telehealth: Payer: Self-pay

## 2015-09-01 NOTE — Telephone Encounter (Signed)
-----   Message from Larey Seat, MD sent at 08/29/2015 12:28 PM EDT ----- Normal CBC - patient has his other metabolic Blood work done at [place]. He is a possible candidate for the RLS trial.

## 2015-09-01 NOTE — Telephone Encounter (Signed)
Spoke to pt and advised him that his labs are normal. He is requesting labs to be sent to Dr. Allyson Sabal and to [facility]. I advised him that [facility] can see our records and I will fax to Dr. Allyson Sabal. Pt verbalized understanding.

## 2015-09-18 ENCOUNTER — Other Ambulatory Visit: Payer: Self-pay | Admitting: Neurology

## 2015-09-18 ENCOUNTER — Telehealth: Payer: Self-pay

## 2015-09-18 DIAGNOSIS — F901 Attention-deficit hyperactivity disorder, predominantly hyperactive type: Secondary | ICD-10-CM

## 2015-09-18 DIAGNOSIS — G4710 Hypersomnia, unspecified: Secondary | ICD-10-CM

## 2015-09-18 MED ORDER — METHYLPHENIDATE HCL 20 MG PO TABS: 20.0000 mg | ORAL_TABLET | Freq: Two times a day (BID) | ORAL | Status: DC

## 2015-09-18 NOTE — Telephone Encounter (Signed)
Called pt to let him know that his RX for ritalin is available for pick up at the front desk. No answer, left a message asking that he call me back.  If pt calls back, please advise him that his RX is available for pick up.

## 2015-09-18 NOTE — Telephone Encounter (Signed)
Request entered, forwarded to provider for approval.  

## 2015-09-18 NOTE — Telephone Encounter (Signed)
Patient is calling to get a written Rx for methylphenidate (RITALIN) 20 MG tablet. I advised the Rx will be ready in 24 hours unless otherwise advised by the nurse. Thank you.

## 2015-09-24 ENCOUNTER — Telehealth: Payer: Self-pay | Admitting: Neurology

## 2015-09-24 NOTE — Telephone Encounter (Signed)
I'm seeing Aaron [name] today for an interval meant visit in a restless leg study. The patient has had restless legs for over 2 years probably about a decade long. He has not had trouble to fall asleep but to maintain sleep and his spouse has reported that he kicks relentlessly all night. In the afternoons he often also has the creepy crawly sensations when sitting still it helps him to cross his legs it helps him to move and release the tension. This irresistible urge to move as the hallmark of the restless leg condition. His neurologic exam is nonfocal.  Documented in our research booklets. The patient has intact cranial nerves, no carotid bruit, normal range of motion for the neck in all limbs. No peripheral edema he has folliculitis but no degenerative or malignant skin condition. He is taking gabapentin and he uses a low dose of Zaleplon to induce sleep. He uses this, asked about trazodone but cannot change drugs in [place].

## 2015-09-29 ENCOUNTER — Telehealth: Payer: Self-pay | Admitting: Neurology

## 2015-09-29 ENCOUNTER — Other Ambulatory Visit: Payer: Self-pay | Admitting: Neurology

## 2015-09-29 MED ORDER — GABAPENTIN 400 MG PO CAPS: 400.0000 mg | ORAL_CAPSULE | Freq: Every day | ORAL | Status: DC

## 2015-09-29 MED ORDER — GABAPENTIN 300 MG PO CAPS: ORAL_CAPSULE | ORAL | Status: DC

## 2015-09-29 NOTE — Telephone Encounter (Signed)
Rx signed and faxed.

## 2015-09-29 NOTE — Telephone Encounter (Signed)
The patient received his first IV treatments of iron today. He participates in our restless leg study. He needed for milligrams of Neurontin to be taking nightly for the next 5 days and then switches to 300 mg nightly 1 dose. Both prescriptions were signed and given to Reginia Forts, the  research nurse.CD

## 2015-10-08 ENCOUNTER — Telehealth: Payer: Self-pay | Admitting: Neurology

## 2015-10-08 DIAGNOSIS — F901 Attention-deficit hyperactivity disorder, predominantly hyperactive type: Secondary | ICD-10-CM

## 2015-10-08 MED ORDER — ATOMOXETINE HCL 40 MG PO CAPS: 40.0000 mg | ORAL_CAPSULE | Freq: Every day | ORAL | Status: DC

## 2015-10-08 NOTE — Telephone Encounter (Signed)
Noted Strattera prescibed elsewhere.

## 2015-10-08 NOTE — Telephone Encounter (Signed)
Patient will try Strattera.   I have seen [name] today doing a research session 4 hour iron infusion study and restless leg patients. In this process he told me that he feels that the Ritalin helps his focusing but has not done much to suppress the hyperactivity part. He had tried Adderall before which caused him to have insomnia. We will try Strattera this time. CD

## 2015-10-12 ENCOUNTER — Other Ambulatory Visit: Payer: Self-pay | Admitting: Nurse Practitioner

## 2015-10-17 ENCOUNTER — Other Ambulatory Visit: Payer: Self-pay | Admitting: Neurology

## 2015-10-30 ENCOUNTER — Encounter: Payer: Self-pay | Admitting: Gastroenterology

## 2015-11-25 ENCOUNTER — Encounter: Payer: Self-pay | Admitting: Neurology

## 2015-12-01 ENCOUNTER — Telehealth: Payer: Self-pay | Admitting: Neurology

## 2015-12-01 MED ORDER — ATOMOXETINE HCL 80 MG PO CAPS: 80.0000 mg | ORAL_CAPSULE | Freq: Every day | ORAL | Status: DC

## 2015-12-01 NOTE — Telephone Encounter (Signed)
Spoke to pt. His strattera is not working. He has also tried adderall and ritalin, both which cause trouble sleeping at night. He is requesting a change to a medication that is covered by [facility] and treats his hyperactivity but doesn't keep him up all night. He has not tried vyvanse, daytrana, or dextrostat before.  I advised him that he might need an office visit to discuss change of medications with [name]. Pt is reluctant to make another appt because " [name] sees me all the time, I am up there all the time for research visits with Teodora."  I advised him that as soon as [name] responds with her recommendation, I will call the pt back. Pt verbalized understanding.

## 2015-12-01 NOTE — Telephone Encounter (Signed)
Patient is calling and states he has a new insurance: [employer]. KG#[id number]. Gr [id number]. Rx bin [phone]. He is calling as he is now taking Rx Strattera 40 mg and states that the medication is not working very well for his hyperactivity and that the copay is really high. He would like a different medication but not Adderall or Ritalin which he has taken before.  Please call.

## 2015-12-01 NOTE — Telephone Encounter (Signed)
I would recommend a higher dose of Strattera instead of another substance, the key stimulants were all tried. Amphetamine and Ritalin.

## 2015-12-01 NOTE — Telephone Encounter (Signed)
Spoke to pt again and advised him that [name] increased his strattera to 80 mg daily. He really does not want to keep taking strattera since it is not approved by [facility] and he is paying $960 out of pocket and he is convinced it is not working. He mentioned hearing about "intuniv", a newer medication to treat ADHD. He really wants to just try a new medication.  I advised him that I would ask [name] again what she recommends and call him back.

## 2015-12-02 NOTE — Telephone Encounter (Signed)
I wasn't aware of these exorbitant costs. Modafinil is my only other choice or an immediate release of adderall 10 mg that could be taken up to three times a day, and lasts about 4-6 hours in effect. I will call him later today.

## 2015-12-03 MED ORDER — AMPHETAMINE-DEXTROAMPHETAMINE 10 MG PO TABS: 10.0000 mg | ORAL_TABLET | Freq: Every day | ORAL | Status: DC

## 2015-12-03 MED ORDER — AMPHETAMINE-DEXTROAMPHETAMINE 10 MG PO TABS: ORAL_TABLET | ORAL | Status: DC

## 2015-12-03 NOTE — Telephone Encounter (Signed)
Spoke to [name]. She wants adderall IR 10 mg by mouth may take 1-2 tablets daily

## 2015-12-03 NOTE — Addendum Note (Signed)
Addended by: Lester [place] A on: 12/03/2015 10:10 AM   Modules accepted: Orders, Medications

## 2015-12-03 NOTE — Addendum Note (Signed)
Addended by: Lester [place] A on: 12/03/2015 10:08 AM   Modules accepted: Orders, Medications

## 2015-12-03 NOTE — Telephone Encounter (Signed)
First RX for adderall written today said pt may take 2 tablets BID. This is incorrect. [name] wants pt to take 1-2 tablets by mouth daily. RX updated. First RX shredded. RX placed at front desk for pt to pick up. [name] advised pt that his RX will be ready at the front desk.

## 2015-12-03 NOTE — Telephone Encounter (Signed)
Spoke to patient, hyperativity is his main problem. I will try him on a low dose IR adderall, generic form. I I explained to him he needs to pick this prescription up at the front desk- hope [employer] will permit him this option. CD

## 2016-01-12 ENCOUNTER — Other Ambulatory Visit: Payer: Self-pay | Admitting: Neurology

## 2016-01-12 NOTE — Telephone Encounter (Signed)
Sent to [facility] for review

## 2016-01-12 NOTE — Telephone Encounter (Signed)
Patient is calling to order a written Rx amphetamine-dextroamphetamine 10 mg tablets.  Thanks!

## 2016-01-13 MED ORDER — AMPHETAMINE-DEXTROAMPHETAMINE 10 MG PO TABS: ORAL_TABLET | ORAL | Status: DC

## 2016-01-13 NOTE — Telephone Encounter (Signed)
Spoke to pt and advised him that his RX is ready for pick up at the front desk.

## 2016-02-09 ENCOUNTER — Telehealth: Payer: Self-pay | Admitting: Neurology

## 2016-02-09 MED ORDER — AMPHETAMINE-DEXTROAMPHETAMINE 10 MG PO TABS: ORAL_TABLET | ORAL | Status: DC

## 2016-02-09 MED ORDER — TRAZODONE HCL 50 MG PO TABS: 50.0000 mg | ORAL_TABLET | Freq: Every evening | ORAL | Status: DC | PRN

## 2016-02-09 NOTE — Telephone Encounter (Signed)
Pt request for traZODone (DESYREL) 50 MG tablet with 3 refills. Also request amphetamine-dextroamphetamine (ADDERALL) 10 MG tablet . Please call pt at [phone] to discuss trazodone with refills.

## 2016-02-10 ENCOUNTER — Other Ambulatory Visit: Payer: Self-pay | Admitting: Neurology

## 2016-02-10 NOTE — Telephone Encounter (Signed)
Pt called back and was told about medication.

## 2016-02-10 NOTE — Telephone Encounter (Signed)
[name] changed the RX for trazodone for 3 refills and it was sent to his pharmacy. Pt's adderall RX is ready for pick up at the front desk.  I called pt to discuss. No answer, left a message asking him to call me back. If pt calls back, please advise him of this information.

## 2016-03-15 ENCOUNTER — Other Ambulatory Visit: Payer: Self-pay | Admitting: Neurology

## 2016-03-15 ENCOUNTER — Ambulatory Visit (INDEPENDENT_AMBULATORY_CARE_PROVIDER_SITE_OTHER): Payer: Self-pay | Admitting: Neurology

## 2016-03-15 ENCOUNTER — Encounter: Payer: Self-pay | Admitting: Neurology

## 2016-03-15 DIAGNOSIS — Z0289 Encounter for other administrative examinations: Secondary | ICD-10-CM

## 2016-03-15 DIAGNOSIS — F901 Attention-deficit hyperactivity disorder, predominantly hyperactive type: Secondary | ICD-10-CM

## 2016-03-15 DIAGNOSIS — G4710 Hypersomnia, unspecified: Secondary | ICD-10-CM

## 2016-03-15 MED ORDER — AMPHETAMINE-DEXTROAMPHETAMINE 10 MG PO TABS: ORAL_TABLET | ORAL | Status: DC

## 2016-03-15 MED ORDER — TRAZODONE HCL 50 MG PO TABS
50.0000 mg | ORAL_TABLET | Freq: Every evening | ORAL | Status: DC | PRN
Start: 2016-03-15 — End: 2016-09-09

## 2016-03-15 MED ORDER — METHYLPHENIDATE HCL 20 MG PO TABS: 20.0000 mg | ORAL_TABLET | Freq: Two times a day (BID) | ORAL | Status: DC

## 2016-03-15 NOTE — Telephone Encounter (Signed)
Pt was given his adderall RX today at his research visit.

## 2016-03-15 NOTE — Telephone Encounter (Signed)
Patient requesting refill of  amphetamine-dextroamphetamine (ADDERALL) 10 MG tablet Pharmacy:[id number] - [place], [place] - [address]

## 2016-03-15 NOTE — Progress Notes (Signed)
SLEEP MEDICINE CLINIC   Provider:  Larey [name], M D  Referring Provider: Posey Boyer, MD Primary Care Physician:  Aaron Reason, MD  No chief complaint on file.   HPI:  Aaron [name] is a 55 y.o. male , seen here as a revisit  from Aaron [name] , following up on a sleep study from 2014 , diagnosed with RLS, insomnia,  I have personally last seen Aaron [name] in 2014 after a diagnosed him with restless leg syndrome related insomnia and had explained to him that his apnea was so mild that CPAP intervention was not justified. In the meantime he has followed up for his celiac disease with a specialist at [facility] in [place], Aaron [name].  He has a family history of psoriasis, he was only diagnosed with celiac disease at age 44. He is unaware of a family history of celiac, Crohn's or any colitis. He is concerned about malabsorption. He used to take po Iron, but stopped after reading " it's bad for older folks" .  His son was diagnosed with IBS, negative celiac testing. He has sleeping problems, too.   Sleep habits are as follows:  His usual bedtime is around midnight, he only sleeps about 2-3 hours on block and then wakes up. His restless legs do no longer bother him that much that he couldn't initiate sleep. He does wake up in the middle of the night however with kicking and he is aware of his mild sleep apnea. Usually he tries to distract himself a little bit watches TV or reads until he is able to fall asleep again. He will get another 2 or 3 hours of sleep and usually sleeps better in the morning hours. He uses Gabapentin for RLS, 400 mg qhs.   Sleep medical history and family sleep history: son has insomnia.  Social history: Patient is not a smoker no tobacco use in any form, 2 mixed drinks a day on average, he drinks coffee only in the morning 2-3 cups a day. No soda, tea is caffeine free  Review of Systems: Out of a complete 14 system review, the patient complains of only  the following symptoms, and all other reviewed systems are negative. Hyperactivity, better focus on ritalin.  Did not tolerate adderall.   Epworth score 12 , Fatigue severity score 40  , depression score 2, endorses an irresistible urge to move in the late afternoon and evening hours but more so if he was physically very active in daytime before.  [facility] restless leg syndrome quality of life questionnaire:  the patient endorsed a moderate impairment over the last 4 weeks;  restless legs doesn't do not keep him from attending social activities but they trouble him more concentrated in the afternoon and the evening and sometimes also affect his ability to get up in the morning.   Social History   Social History  . Marital Status: Married    Spouse Name: Aaron [name]  . Number of Children: 3  . Years of Education: N/A   Occupational History  . Manager    Social History Main Topics  . Smoking status: Never Smoker   . Smokeless tobacco: Never Used  . Alcohol Use: 4.2 oz/week    7 Standard drinks or equivalent per week     Comment: Occassionally  . Drug Use: No  . Sexual Activity: Yes   Other Topics Concern  . Not on file   Social History Narrative   Patient lives at home with  wife Aaron [name].    Patient has 3 children.    Patient has a BA.     Family History  Problem Relation Age of Onset  . Hypertension Mother   . Diabetes Mother   . Colon cancer Neg Hx   . Hypertension Father   . Heart disease Maternal Grandmother   . Heart disease Maternal Grandfather   . Heart disease Paternal Grandfather   . Colon polyps Neg Hx   . [name] disease Neg Hx   . Esophageal cancer Neg Hx     Past Medical History  Diagnosis Date  . Sleep apnea syndrome     tested  under [facility] lab,  12-2-- 2004  . RLS (restless legs syndrome)   . GERD (gastroesophageal reflux disease)   . Celiac disease     Past Surgical History  Procedure Laterality Date  . Appendectomy    . Hernia  repair      Current Outpatient Prescriptions  Medication Sig Dispense Refill  . amphetamine-dextroamphetamine (ADDERALL) 10 MG tablet May take 1-2 tablets by mouth daily. 60 tablet 0  . Doxycycline Hyclate (ACTICLATE) 150 MG TABS Take 150 mg by mouth.    . ferrous sulfate 325 (65 FE) MG tablet Take 325 mg by mouth daily. 2 times weekly    . fish oil-omega-3 fatty acids 1000 MG capsule Take 2 g by mouth daily.    . fluocinonide (LIDEX) 0.05 % external solution     . gabapentin (NEURONTIN) 300 MG capsule Switch from 400 mg to 300 mg nightly dose after day 5 on IV therapy. 30 capsule 11  . gabapentin (NEURONTIN) 400 MG capsule Take 1 capsule (400 mg total) by mouth at bedtime. 8 capsule 0  . hydrocortisone 2.5 % cream     . methylphenidate (RITALIN) 20 MG tablet Take 1 tablet (20 mg total) by mouth 2 (two) times daily. 60 tablet 0  . traZODone (DESYREL) 50 MG tablet Take 1 tablet (50 mg total) by mouth at bedtime as needed for sleep. 30 tablet 3  . zaleplon (SONATA) 10 MG capsule TAKE 1 CAPSULE BY MOUTH EVERY DAY AT BEDTIME AS NEEDED FOR SLEEP 30 capsule 3   No current facility-administered medications for this visit.    Allergies as of 03/15/2016  . (No Known Allergies)    Vitals: There were no vitals taken for this visit. Last Weight:  Wt Readings from Last 1 Encounters:  08/28/15 218 lb (98.884 kg)   NTI:RWERX is no weight on file to calculate BMI.     Last Height:   Ht Readings from Last 1 Encounters:  08/28/15 6' (1.829 m)    Physical exam:  General: The patient is awake, alert and appears not in acute distress. The patient is well groomed. Head: Normocephalic, atraumatic. Neck is supple. Mallampati 3 ,  neck circumference: 14.5. Nasal airflow unrestricition , neither TMJ nor Retrognathia is seen.  Cardiovascular:  Regular rate and rhythm without  murmurs or carotid bruit, and without distended neck veins. Respiratory: Lungs are clear to auscultation. Skin:  Without  evidence of edema, bluish discoloration of the feet. Toes.  Trunk: BMI is 30 The patient's posture is erect.  Neurologic exam : The patient is awake and alert, oriented to place and time.   Memory subjective  described as intact. Attention span & concentration ability appears normal.  Speech is fluent,  Without dysarthria, dysphonia or aphasia.   Cranial nerves: Pupils are equal and briskly reactive to light. Funduscopic  exam without  evidence of pallor or edema. Extraocular movements  in vertical and horizontal planes intact and without nystagmus. Visual fields by finger perimetry are intact. Hearing to finger rub intact.   Facial sensation intact to fine touch.  Facial motor strength is symmetric and tongue and uvula move midline. Shoulder shrug was symmetrical.   Motor exam:   Normal tone, muscle bulk and symmetric strength in all extremities.  Sensory:  Fine touch, pinprick and vibration were tested in all extremities. Proprioception tested in the upper extremities was normal.  Coordination: Rapid alternating movements in the fingers/hands was normal. Finger-to-nose maneuver  normal without evidence of ataxia, dysmetria or tremor.  Gait and station: Patient walks without assistive device and is able unassisted to climb up to the exam table. Strength within normal limits.  Stance is stable and normal.  Toe and heel stand were tested .Tandem gait is unfragmented. Turns with 3  Steps. Romberg testing is  negative.  Deep tendon reflexes: in the  upper and lower extremities are symmetric and intact. Babinski maneuver response is  downgoing.   Assessment:  After physical and neurologic examination, review of laboratory studies,  Personal review of imaging studies, reports of other /same  Imaging studies ,  Results of polysomnography/ neurophysiology testing and pre-existing records as far as provided in visit., my assessment is positive for  RLS .   1) No neuropathy.   2) RLS -  irresistible urge to move. Aaron [name] received iron infusions doing a restless leg trial here at [facility]. The study was financed by [employer]. The patient has had a delay 8 positive response to the iron infusions and has felt less impairment by restless legs since the treatment. He is still frequently waking up from the restless legs but it is an improvement in comparison to prior to visit enrollment. Usually once a night.  3) ADHD , adult form, he has continued Ritalin .  4) insomnia, currently treated with trazodone which was well tolerated. Refills today    Plan:  Treatment plan and additional workup :  Aaron [name] is seen today for a research visit. This is a no charge visit to the patient. I took the opportunity and also refilled his sleep aid on the same visit.     Aaron Partridge Zavien Clubb MD  03/15/2016   CC: Aaron [name], [place] [place], [place] [id number]

## 2016-03-22 ENCOUNTER — Telehealth: Payer: Self-pay | Admitting: Neurology

## 2016-03-22 NOTE — Telephone Encounter (Signed)
Aaron [name]  Physician :  DOHMIER  [address]:  [phone]  [place] called [facility] and left message about this call.

## 2016-03-30 ENCOUNTER — Other Ambulatory Visit: Payer: Self-pay

## 2016-03-30 MED ORDER — ZALEPLON 10 MG PO CAPS: 10.0000 mg | ORAL_CAPSULE | Freq: Every evening | ORAL | Status: DC | PRN

## 2016-03-30 NOTE — Telephone Encounter (Signed)
RX for sonata faxed to [facility]. Received a receipt of confirmation.

## 2016-04-13 ENCOUNTER — Other Ambulatory Visit: Payer: Self-pay | Admitting: Neurology

## 2016-04-13 NOTE — Telephone Encounter (Signed)
This encounter was created in error - please disregard.

## 2016-04-13 NOTE — Telephone Encounter (Signed)
Patient requesting refill of amphetamine-dextroamphetamine (ADDERALL) 10 MG tablet

## 2016-04-14 MED ORDER — AMPHETAMINE-DEXTROAMPHETAMINE 10 MG PO TABS: ORAL_TABLET | ORAL | Status: DC

## 2016-04-14 NOTE — Telephone Encounter (Signed)
I spoke to pt and advised him that his adderall RX is available for pick up at the front desk. Pt verbalized understanding.

## 2016-05-03 ENCOUNTER — Ambulatory Visit (INDEPENDENT_AMBULATORY_CARE_PROVIDER_SITE_OTHER): Payer: BLUE CROSS/BLUE SHIELD | Admitting: Family Medicine

## 2016-05-03 ENCOUNTER — Encounter: Payer: Self-pay | Admitting: Family Medicine

## 2016-05-03 VITALS — BP 153/92 | HR 56 | Temp 97.9°F | Resp 16 | Ht 71.0 in | Wt 183.0 lb

## 2016-05-03 DIAGNOSIS — F329 Major depressive disorder, single episode, unspecified: Secondary | ICD-10-CM | POA: Diagnosis not present

## 2016-05-03 DIAGNOSIS — F32A Depression, unspecified: Secondary | ICD-10-CM

## 2016-05-03 DIAGNOSIS — K432 Incisional hernia without obstruction or gangrene: Secondary | ICD-10-CM | POA: Diagnosis not present

## 2016-05-03 MED ORDER — SERTRALINE HCL 50 MG PO TABS: 50.0000 mg | ORAL_TABLET | Freq: Every day | ORAL | Status: DC

## 2016-05-03 NOTE — Patient Instructions (Addendum)
IF you received an x-ray today, you will receive an invoice from [facility]. Please contact [facility] at [phone] with questions or concerns regarding your invoice.   IF you received labwork today, you will receive an invoice from [employer]. Please contact [facility] at [phone] with questions or concerns regarding your invoice.   Our billing staff will not be able to assist you with questions regarding bills from these companies.  You will be contacted with the lab results as soon as they are available. The fastest way to get your results is to activate your My Chart account. Instructions are located on the last page of this paperwork. If you have not heard from [place] regarding the results in 2 weeks, please contact this office.    Okay to continue the support groups, but I also recommend meeting with an individual counselor. I provided some names below, but let me know if you need other names or numbers. Counseling through church may also be beneficial. Start Zoloft 1 per day. No change in your other medications for now. I do recommend cutting back on alcohol use to no more than 2-3 drinks per day. If you have difficulty cutting back on alcohol, let me know and I can provide some resources to assist with that as well. Recheck in the next 4-6 weeks to see how the medication is doing. Return to the clinic or go to the nearest emergency room if any of your symptoms worsen or new symptoms occur.  Vivia Budge: [phone] Arvil [place]: [phone]  Okay to continue exercise as long as it is not causing straining of your abdominal wall into your evaluated by the general surgeon. I did refer you to Dr. Harlow Asa, see precautions below.  Hernia, Adult A hernia is the bulging of an organ or tissue through a weak spot in the muscles of the abdomen (abdominal wall). Hernias develop most often near the navel or groin. There are many kinds of hernias.  Common kinds include:  Femoral hernia. This kind of hernia develops under the groin in the upper thigh area.  Inguinal hernia. This kind of hernia develops in the groin or scrotum.  Umbilical hernia. This kind of hernia develops near the navel.  Hiatal hernia. This kind of hernia causes part of the stomach to be pushed up into the chest.  Incisional hernia. This kind of hernia bulges through a scar from an abdominal surgery. CAUSES This condition may be caused by:  Heavy lifting.  Coughing over a long period of time.  Straining to have a bowel movement.  An incision made during an abdominal surgery.  A birth defect (congenital defect).  Excess weight or obesity.  Smoking.  Poor nutrition.  Cystic fibrosis.  Excess fluid in the abdomen.  Undescended testicles. SYMPTOMS Symptoms of a hernia include:  A lump on the abdomen. This is the first sign of a hernia. The lump may become more obvious with standing, straining, or coughing. It may get bigger over time if it is not treated or if the condition causing it is not treated.  Pain. A hernia is usually painless, but it may become painful over time if treatment is delayed. The pain is usually dull and may get worse with standing or lifting heavy objects. Sometimes a hernia gets tightly squeezed in the weak spot (strangulated) or stuck there (incarcerated) and causes additional symptoms. These symptoms may include:  Vomiting.  Nausea.  Constipation.  Irritability. DIAGNOSIS A hernia may  be diagnosed with:  A physical exam. During the exam your health care provider may ask you to cough or to make a specific movement, because a hernia is usually more visible when you move.  Imaging tests. These can include:  X-rays.  Ultrasound.  CT scan. TREATMENT A hernia that is small and painless may not need to be treated. A hernia that is large or painful may be treated with surgery. Inguinal hernias may be treated with  surgery to prevent incarceration or strangulation. Strangulated hernias are always treated with surgery, because lack of blood to the trapped organ or tissue can cause it to die. Surgery to treat a hernia involves pushing the bulge back into place and repairing the weak part of the abdomen. HOME CARE INSTRUCTIONS  Avoid straining.  Do not lift anything heavier than 10 lb (4.5 kg).  Lift with your leg muscles, not your back muscles. This helps avoid strain.  When coughing, try to cough gently.  Prevent constipation. Constipation leads to straining with bowel movements, which can make a hernia worse or cause a hernia repair to break down. You can prevent constipation by:  Eating a high-fiber diet that includes plenty of fruits and vegetables.  Drinking enough fluids to keep your urine clear or pale yellow. Aim to drink 6-8 glasses of water per day.  Using a stool softener as directed by your health care provider.  Lose weight, if you are overweight.  Do not use any tobacco products, including cigarettes, chewing tobacco, or electronic cigarettes. If you need help quitting, ask your health care provider.  Keep all follow-up visits as directed by your health care provider. This is important. Your health care provider may need to monitor your condition. SEEK MEDICAL CARE IF:  You have swelling, redness, and pain in the affected area.  Your bowel habits change. SEEK IMMEDIATE MEDICAL CARE IF:  You have a fever.  You have abdominal pain that is getting worse.  You feel nauseous or you vomit.  You cannot push the hernia back in place by gently pressing on it while you are lying down.  The hernia:  Changes in shape or size.  Is stuck outside the abdomen.  Becomes discolored.  Feels hard or tender.   This information is not intended to replace advice given to you by your health care provider. Make sure you discuss any questions you have with your health care provider.    Document Released: 10/25/2005 Document Revised: 11/15/2014 Document Reviewed: 09/04/2014 Elsevier Interactive Patient Education [employer].

## 2016-05-03 NOTE — Progress Notes (Signed)
Subjective:  By signing my name below, I, Aaron [name], attest that this documentation has been prepared under the direction and in the presence of Aaron Ray, MD.  Electronically Signed: Thea [name], ED Scribe. 05/03/2016. 3:34 PM.   Patient ID: Aaron [name], male    DOB: 04-Apr-1961, 55 y.o.   MRN: [id number]  HPI Chief Complaint  Patient presents with  . Abdominal Pain    UPPER RIGHT SIDE ABDOMINAL PAIN, HX OF HERNIA 2O YEARS AGO IN THAT AREA   . PHQ-9    SCORE 15    HPI Comments: Aaron [name] is a 55 y.o. male who presents to the [facility] complaining of possible hernia and abdominal pain . Pt has hx of appendicitis in 97'.  He states 3 years later in 2000 he had a mesh repair for hernia. Pt states he has been working out. After working out 4 days ago he felt sorenss to right lower abdomen that worsened the following day with swelling. He feels some popping right lower abdomen with coughing. He denies nausea and emesis.    Positive depression screening  Depression screen [facility] 2/9 05/03/2016 08/28/2015  Decreased Interest 1 0  Down, Depressed, Hopeless 2 0  PHQ - 2 Score 3 0  Altered sleeping 0 -  Tired, decreased energy 3 -  Change in appetite 3 -  Feeling bad or failure about yourself  3 -  Trouble concentrating 3 -  Moving slowly or fidgety/restless 0 -  Suicidal thoughts 0 -  PHQ-9 Score 15 -   He is currently under treatment for insomnia and RLS, by Aaron [name]. He takes ritalin for ADHD and trazodone for insomnia.  Pt used to be under the care of Aaron [name] but has not found another PCP since his retirement.  Pt states his wife left him over 7.5 months ago. He states he has loss 40 lbs since his wife has left. He has loss the desire to eat. He has been meeting with a divorce support group. He has also met with a counselor.  He exercises about 7 days a week, which include swimming, running and lifting weight. . Pt reports hx of depression  about 20 years ago, but not to the extent of how he feels today. He found that zoloft worked best during that time. He had also tried 2 other medication including prozac but felt this medication made him feel like a zombie. Pt drinks 3-4 shots a day, occasionally 5. Pt admits some difficulty cutting back. No hx of DUI. Pt denies SI/HI.   Patient Active Problem List   Diagnosis Date Noted  . Insomnia 01/09/2015  . Sleep apnea syndrome   . RLS (restless legs syndrome)   . GERD 08/19/2009  . GASTRITIS 08/19/2009  . CELIAC SPRUE 07/23/2009  . DIARRHEA 07/02/2009  . INSOMNIA-SLEEP DISORDER-UNSPEC 06/30/2009  . ABDOMINAL PAIN OTHER SPECIFIED SITE 06/30/2009  . DEPRESSION 06/05/2008  . OBSTRUCTIVE SLEEP APNEA 06/05/2008  . RESTLESS LEGS SYNDROME 06/05/2008   Past Medical History  Diagnosis Date  . Sleep apnea syndrome     tested  under [place] [facility],  12-2-- 2004  . RLS (restless legs syndrome)   . GERD (gastroesophageal reflux disease)   . Celiac disease    Past Surgical History  Procedure Laterality Date  . Appendectomy    . Hernia repair     No Known Allergies Prior to Admission medications   Medication Sig Start Date End  Date Taking? Authorizing Provider  amphetamine-dextroamphetamine (ADDERALL) 10 MG tablet May take 1-2 tablets by mouth daily. 04/14/16   Asencion Partridge Dohmeier, MD  Doxycycline Hyclate (ACTICLATE) 150 MG TABS Take 150 mg by mouth.    Historical Provider, MD  ferrous sulfate 325 (65 FE) MG tablet Take 325 mg by mouth daily. 2 times weekly    Historical Provider, MD  fish oil-omega-3 fatty acids 1000 MG capsule Take 2 g by mouth daily.    Historical Provider, MD  fluocinonide (LIDEX) 0.05 % external solution  07/31/15   Historical Provider, MD  gabapentin (NEURONTIN) 300 MG capsule Switch from 400 mg to 300 mg nightly dose after day 5 on IV therapy. 09/29/15   Asencion Partridge Dohmeier, MD  gabapentin (NEURONTIN) 400 MG capsule Take 1 capsule (400 mg total) by mouth at  bedtime. 09/29/15   Asencion Partridge Dohmeier, MD  hydrocortisone 2.5 % cream  07/31/15   Historical Provider, MD  methylphenidate (RITALIN) 20 MG tablet Take 1 tablet (20 mg total) by mouth 2 (two) times daily. 03/15/16   Asencion Partridge Dohmeier, MD  traZODone (DESYREL) 50 MG tablet Take 1 tablet (50 mg total) by mouth at bedtime as needed for sleep. 03/15/16   Asencion Partridge Dohmeier, MD  zaleplon (SONATA) 10 MG capsule Take 1 capsule (10 mg total) by mouth at bedtime as needed for sleep. 03/30/16   Larey Seat, MD   Social History   Social History  . Marital Status: Married    Spouse Name: Aaron [name]  . Number of Children: 3  . Years of Education: N/A   Occupational History  . Manager    Social History Main Topics  . Smoking status: Never Smoker   . Smokeless tobacco: Never Used  . Alcohol Use: 4.2 oz/week    7 Standard drinks or equivalent per week     Comment: Occassionally  . Drug Use: No  . Sexual Activity: Yes   Other Topics Concern  . Not on file   Social History Narrative   Patient lives at home with wife Aaron [name].    Patient has 3 children.    Patient has a BA.     Review of Systems  Psychiatric/Behavioral: Positive for dysphoric mood.   Objective:   Physical Exam  Constitutional: He is oriented to person, place, and time. He appears well-developed and well-nourished. No distress.  HENT:  Head: Normocephalic and atraumatic.  Eyes: Conjunctivae and EOM are normal.  Neck: Neck supple.  Cardiovascular: Normal rate.   Pulmonary/Chest: Effort normal.  Abdominal:  Possible defect at right lateral border of the abdominal wall scar.  Musculoskeletal: Normal range of motion.  Neurological: He is alert and oriented to person, place, and time.  Skin: Skin is warm and dry.  Psychiatric: He has a normal mood and affect. His behavior is normal.  Nursing note and vitals reviewed.      Assessment & Plan:   Aaron [name] is a 55 y.o. male Depression - Plan: sertraline (ZOLOFT) 50 MG tablet  -  long-standing with recent adjustment component and worsening since separation from wife. Persistent symptoms, decided to start SSRI. Zoloft 50 mg daily, side effects discussed, plan to recheck in the next 4-6 weeks.  -Continue support groups, but also recommended individual counseling. Resources provided.  -RTC precautions if worsening prior to planned follow-up in 4-6 weeks.  Incisional hernia, without obstruction or gangrene - Plan: Ambulatory referral to General Surgery  -Possible [name] hernia at previous location of incisional hernia. Will refer to general surgery to  determine if imaging needed to clarify hernia or management options. Lifting, straining precautions and RTC/ER precautions discussed for hernia in after visit summary.  Meds ordered this encounter  Medications  . sertraline (ZOLOFT) 50 MG tablet    Sig: Take 1 tablet (50 mg total) by mouth daily.    Dispense:  30 tablet    Refill:  1   Patient Instructions       IF you received an x-[name] today, you will receive an invoice from [facility]. Please contact [facility] at [phone] with questions or concerns regarding your invoice.   IF you received labwork today, you will receive an invoice from [employer]. Please contact [facility] at [phone] with questions or concerns regarding your invoice.   Our billing staff will not be able to assist you with questions regarding bills from these companies.  You will be contacted with the lab results as soon as they are available. The fastest way to get your results is to activate your My Chart account. Instructions are located on the last page of this paperwork. If you have not heard from [place] regarding the results in 2 weeks, please contact this office.    Okay to continue the support groups, but I also recommend meeting with an individual counselor. I provided some names below, but let me know if you need other names or numbers.  Counseling through church may also be beneficial. Start Zoloft 1 per day. No change in your other medications for now. I do recommend cutting back on alcohol use to no more than 2-3 drinks per day. If you have difficulty cutting back on alcohol, let me know and I can provide some resources to assist with that as well. Recheck in the next 4-6 weeks to see how the medication is doing. Return to the clinic or go to the nearest emergency room if any of your symptoms worsen or new symptoms occur.  Aaron [name]: [phone] Aaron [name]: [phone]  Okay to continue exercise as long as it is not causing straining of your abdominal wall into your evaluated by the general surgeon. I did refer you to Dr. Harlow Asa, see precautions below.  Hernia, Adult A hernia is the bulging of an organ or tissue through a weak spot in the muscles of the abdomen (abdominal wall). Hernias develop most often near the navel or groin. There are many kinds of hernias. Common kinds include:  Femoral hernia. This kind of hernia develops under the groin in the upper thigh area.  Inguinal hernia. This kind of hernia develops in the groin or scrotum.  Umbilical hernia. This kind of hernia develops near the navel.  Hiatal hernia. This kind of hernia causes part of the stomach to be pushed up into the chest.  Incisional hernia. This kind of hernia bulges through a scar from an abdominal surgery. CAUSES This condition may be caused by:  Heavy lifting.  Coughing over a long period of time.  Straining to have a bowel movement.  An incision made during an abdominal surgery.  A birth defect (congenital defect).  Excess weight or obesity.  Smoking.  Poor nutrition.  Cystic fibrosis.  Excess fluid in the abdomen.  Undescended testicles. SYMPTOMS Symptoms of a hernia include:  A lump on the abdomen. This is the first sign of a hernia. The lump may become more obvious with standing, straining, or coughing. It may get  bigger over time if it is not treated or if the condition causing it  is not treated.  Pain. A hernia is usually painless, but it may become painful over time if treatment is delayed. The pain is usually dull and may get worse with standing or lifting heavy objects. Sometimes a hernia gets tightly squeezed in the weak spot (strangulated) or stuck there (incarcerated) and causes additional symptoms. These symptoms may include:  Vomiting.  Nausea.  Constipation.  Irritability. DIAGNOSIS A hernia may be diagnosed with:  A physical exam. During the exam your health care provider may ask you to cough or to make a specific movement, because a hernia is usually more visible when you move.  Imaging tests. These can include:  X-rays.  Ultrasound.  CT scan. TREATMENT A hernia that is [name] and painless may not need to be treated. A hernia that is large or painful may be treated with surgery. Inguinal hernias may be treated with surgery to prevent incarceration or strangulation. Strangulated hernias are always treated with surgery, because lack of blood to the trapped organ or tissue can cause it to die. Surgery to treat a hernia involves pushing the bulge back into place and repairing the weak part of the abdomen. HOME CARE INSTRUCTIONS  Avoid straining.  Do not lift anything heavier than 10 lb (4.5 kg).  Lift with your leg muscles, not your back muscles. This helps avoid strain.  When coughing, try to cough gently.  Prevent constipation. Constipation leads to straining with bowel movements, which can make a hernia worse or cause a hernia repair to break down. You can prevent constipation by:  Eating a high-fiber diet that includes plenty of fruits and vegetables.  Drinking enough fluids to keep your urine clear or pale yellow. Aim to drink 6-8 glasses of water per day.  Using a stool softener as directed by your health care provider.  Lose weight, if you are overweight.  Do not  use any tobacco products, including cigarettes, chewing tobacco, or electronic cigarettes. If you need help quitting, ask your health care provider.  Keep all follow-up visits as directed by your health care provider. This is important. Your health care provider may need to monitor your condition. SEEK MEDICAL CARE IF:  You have swelling, redness, and pain in the affected area.  Your bowel habits change. SEEK IMMEDIATE MEDICAL CARE IF:  You have a fever.  You have abdominal pain that is getting worse.  You feel nauseous or you vomit.  You cannot push the hernia back in place by gently pressing on it while you are lying down.  The hernia:  Changes in shape or size.  Is stuck outside the abdomen.  Becomes discolored.  Feels hard or tender.   This information is not intended to replace advice given to you by your health care provider. Make sure you discuss any questions you have with your health care provider.   Document Released: 10/25/2005 Document Revised: 11/15/2014 Document Reviewed: 09/04/2014 Elsevier Interactive Patient Education [employer].     I personally performed the services described in this documentation, which was scribed in my presence. The recorded information has been reviewed and considered, and addended by me as needed.   Signed,   Aaron Ray, MD [facility].  05/05/2016 11:34 AM

## 2016-05-05 ENCOUNTER — Telehealth: Payer: Self-pay

## 2016-05-05 NOTE — Telephone Encounter (Signed)
Patient is calling because he was referred by Dr. Carlota Raspberry to see Dr. Harlow Asa at [facility] and they don't have anything open until July 19th. Patient wants to know if Dr. Nyoka Cowden can call to get a sooner appointment.  Patient phone: [phone]

## 2016-05-06 NOTE — Telephone Encounter (Signed)
I do not know how to get in any sooner to Dr. Harlow Asa - he could see if there is a standby list for earlier appt with Dr. Harlow Asa or can see if other provider available sooner if he would like that option.

## 2016-05-06 NOTE — Telephone Encounter (Signed)
Dr. Carlota Raspberry please advise.

## 2016-05-06 NOTE — Telephone Encounter (Signed)
Spoke with pt and explained this to him. Pt stated he would call and see if he can see another provider/ get on standby list.

## 2016-05-13 ENCOUNTER — Telehealth: Payer: Self-pay | Admitting: Neurology

## 2016-05-13 MED ORDER — AMPHETAMINE-DEXTROAMPHETAMINE 10 MG PO TABS: ORAL_TABLET | ORAL | Status: DC

## 2016-05-13 NOTE — Telephone Encounter (Signed)
RX signed by Dr. Krista Blue, WID for 05/13/16 afternoon.   I spoke to pt and advised him that his RX was available for pick up at the front desk. Pt verbalized understanding.

## 2016-05-13 NOTE — Telephone Encounter (Signed)
Patient requesting refill of  amphetamine-dextroamphetamine (ADDERALL) 10 MG tablet . Pt is aware the clinic closes at noon tomorrow

## 2016-06-14 ENCOUNTER — Other Ambulatory Visit: Payer: Self-pay | Admitting: Neurology

## 2016-06-14 MED ORDER — AMPHETAMINE-DEXTROAMPHETAMINE 10 MG PO TABS: ORAL_TABLET | ORAL | 0 refills | Status: DC

## 2016-06-14 NOTE — Telephone Encounter (Signed)
Patient requesting refill of amphetamine-dextroamphetamine (ADDERALL) 10 MG tablet

## 2016-06-14 NOTE — Telephone Encounter (Signed)
Sent to [facility] for review.  Adderall last refilled on 05/13/2016.

## 2016-06-15 NOTE — Telephone Encounter (Signed)
I spoke to pt and advised him that his RX for adderall is ready for pick up at the front desk. Pt verbalized understanding.

## 2016-06-22 ENCOUNTER — Other Ambulatory Visit: Payer: Self-pay | Admitting: Family Medicine

## 2016-06-22 DIAGNOSIS — F32A Depression, unspecified: Secondary | ICD-10-CM

## 2016-06-22 DIAGNOSIS — F329 Major depressive disorder, single episode, unspecified: Secondary | ICD-10-CM

## 2016-07-14 ENCOUNTER — Telehealth: Payer: Self-pay | Admitting: Neurology

## 2016-07-14 MED ORDER — AMPHETAMINE-DEXTROAMPHETAMINE 10 MG PO TABS: ORAL_TABLET | ORAL | 0 refills | Status: DC

## 2016-07-14 NOTE — Telephone Encounter (Signed)
Rx. awaiting RAS sig/fim

## 2016-07-14 NOTE — Telephone Encounter (Signed)
Pt request refill for amphetamine-dextroamphetamine (ADDERALL) 10 MG tablet

## 2016-07-14 NOTE — Telephone Encounter (Signed)
Adderall rx. up front [facility]/fim

## 2016-07-15 ENCOUNTER — Telehealth: Payer: Self-pay | Admitting: Neurology

## 2016-07-15 NOTE — Telephone Encounter (Addendum)
Patient called to check status of refill for amphetamine-dextroamphetamine (ADDERALL) 10 MG tablet, patient advised Rx is ready up front for pick up.

## 2016-07-16 ENCOUNTER — Encounter: Payer: Self-pay | Admitting: *Deleted

## 2016-07-16 ENCOUNTER — Other Ambulatory Visit: Payer: Self-pay | Admitting: *Deleted

## 2016-07-16 MED ORDER — ZALEPLON 10 MG PO CAPS: 10.0000 mg | ORAL_CAPSULE | Freq: Every evening | ORAL | 3 refills | Status: DC | PRN

## 2016-07-16 NOTE — Telephone Encounter (Signed)
Patient has not been seen since 08/2015. I will refeill for one month only and he has to follow up with dr D. thanks

## 2016-07-16 NOTE — Telephone Encounter (Signed)
Pt has appt 09-28-16 at 1330 with research.

## 2016-07-16 NOTE — Progress Notes (Signed)
Faxed printed/signed rx sonata by AA, MD since Dr Dohmeier out of office to pt pharmacy. Fax: [phone]. Received confirmation.

## 2016-07-16 NOTE — Addendum Note (Signed)
Addended byOliver Hum on: 07/16/2016 11:54 AM   Modules accepted: Orders

## 2016-07-20 ENCOUNTER — Other Ambulatory Visit: Payer: Self-pay | Admitting: Family Medicine

## 2016-07-20 DIAGNOSIS — F329 Major depressive disorder, single episode, unspecified: Secondary | ICD-10-CM

## 2016-07-20 DIAGNOSIS — F32A Depression, unspecified: Secondary | ICD-10-CM

## 2016-07-28 ENCOUNTER — Other Ambulatory Visit: Payer: Self-pay | Admitting: Family Medicine

## 2016-07-28 DIAGNOSIS — F329 Major depressive disorder, single episode, unspecified: Secondary | ICD-10-CM

## 2016-07-28 DIAGNOSIS — F32A Depression, unspecified: Secondary | ICD-10-CM

## 2016-08-06 ENCOUNTER — Ambulatory Visit (INDEPENDENT_AMBULATORY_CARE_PROVIDER_SITE_OTHER): Payer: BLUE CROSS/BLUE SHIELD | Admitting: Family Medicine

## 2016-08-06 VITALS — BP 122/72 | HR 61 | Temp 98.2°F | Resp 17 | Ht 71.5 in | Wt 191.0 lb

## 2016-08-06 DIAGNOSIS — F329 Major depressive disorder, single episode, unspecified: Secondary | ICD-10-CM | POA: Diagnosis not present

## 2016-08-06 DIAGNOSIS — Z23 Encounter for immunization: Secondary | ICD-10-CM | POA: Diagnosis not present

## 2016-08-06 DIAGNOSIS — L905 Scar conditions and fibrosis of skin: Secondary | ICD-10-CM | POA: Diagnosis not present

## 2016-08-06 MED ORDER — SERTRALINE HCL 50 MG PO TABS: ORAL_TABLET | ORAL | 1 refills | Status: DC

## 2016-08-06 NOTE — Progress Notes (Signed)
Subjective:  By signing my name below, I, Aaron [name], attest that this documentation has been prepared under the direction and in the presence of Aaron Ray, MD. Electronically Signed: [name], [place]. 08/06/2016 , 6:04 PM .  Patient was seen in Room 13 .   Patient ID: Aaron [name], male    DOB: Mar 28, 1961, 55 y.o.   MRN: [id number] Chief Complaint  Patient presents with  . Medication Refill    zoloft   . Immunizations    flu   HPI Aaron [name] is a 55 y.o. male Here for medication refill on zoloft. He has history of depression, RLS (restless leg syndrome), insomnia with OSA. Last office with me was June 26th. He was seen for hernia symptoms at that time, but new diagnosis of depression with adjustment component since separation with his wife. He was started on zoloft 33m qd; advised to follow up in 6 weeks. He's been treated for ADHD by Dr. DBrett Fairyas [name] as RLS. His OSA was mild, not requiring cpap.   Zoloft He feels great improvement with the zoloft, been taking it at night before sleep. He hasn't been able to talk to his wife at all and may lead to divorce. He's been able to go out to do things that he enjoys. He informs if he stays home, he feels alone and even more depressed. He denies having complications with the zoloft. He's still taking trazodone at night as [name]. He ran out of zoloft 4 days ago, here for medication refill. He denies SI, HI or self injury. He's met with a divorce support group, as [name] as meeting with a counselor.   Abdominal Hernia He met with a general surgeon and was informed he didn't have an abdominal hernia. He had appendectomy in the past and had mesh repair in year 2000.   Alcohol use He has up to 2 alcoholic drinks a day. He denies DUI's in the past. He's self employed.   Immunizations He requested flu shot today as [name].   Patient Active Problem List   Diagnosis Date Noted  . Insomnia 01/09/2015  . Sleep apnea syndrome     . RLS (restless legs syndrome)   . GERD 08/19/2009  . GASTRITIS 08/19/2009  . CELIAC SPRUE 07/23/2009  . DIARRHEA 07/02/2009  . INSOMNIA-SLEEP DISORDER-UNSPEC 06/30/2009  . ABDOMINAL PAIN OTHER SPECIFIED SITE 06/30/2009  . DEPRESSION 06/05/2008  . OBSTRUCTIVE SLEEP APNEA 06/05/2008  . RESTLESS LEGS SYNDROME 06/05/2008   Past Medical History:  Diagnosis Date  . Celiac disease   . GERD (gastroesophageal reflux disease)   . RLS (restless legs syndrome)   . Sleep apnea syndrome    [facility]  [facility]   Past Surgical History:  Procedure Laterality Date  . APPENDECTOMY    . HERNIA REPAIR     No Known Allergies Prior to Admission medications   Medication Sig Start Date End Date Taking? Authorizing Provider  amphetamine-dextroamphetamine (ADDERALL) 10 MG tablet May take 1-2 tablets by mouth daily. 07/14/16  Yes RBritt Bottom MD  ferrous sulfate 325 (65 FE) MG tablet Take 325 mg by mouth daily. 2 times weekly   Yes Historical Provider, MD  fish oil-omega-3 fatty acids 1000 MG capsule Take 2 g by mouth daily.   Yes Historical Provider, MD  fluocinonide (LIDEX) 0.05 % external solution  07/31/15  Yes Historical Provider, MD  gabapentin (NEURONTIN) 300 MG capsule Switch from 400 mg to 300  mg nightly dose after day 5 on IV therapy. 09/29/15  Yes Carmen Dohmeier, MD  gabapentin (NEURONTIN) 400 MG capsule Take 1 capsule (400 mg total) by mouth at bedtime. 09/29/15  Yes Carmen Dohmeier, MD  hydrocortisone 2.5 % cream  07/31/15  Yes Historical Provider, MD  sertraline (ZOLOFT) 50 MG tablet TAKE 1 TABLET(50 MG) BY MOUTH DAILY 06/22/16  Yes [name], MD  traZODone (DESYREL) 50 MG tablet Take 1 tablet (50 mg total) by mouth at bedtime as needed for sleep. 03/15/16  Yes Asencion Partridge Dohmeier, MD  zaleplon (SONATA) 10 MG capsule Take 1 capsule (10 mg total) by mouth at bedtime as needed for sleep. 07/16/16  Yes Melvenia Beam, MD  Doxycycline Hyclate (ACTICLATE) 150 MG  TABS Take 150 mg by mouth.    Historical Provider, MD   Social History   Social History  . Marital status: Married    Spouse name: Mary  . Number of children: 3  . Years of education: N/A   Occupational History  . Manager    Social History Main Topics  . Smoking status: Never Smoker  . Smokeless tobacco: Never Used  . Alcohol use 4.2 oz/week    7 Standard drinks or equivalent per week     Comment: Occassionally  . Drug use: No  . Sexual activity: Yes   Other Topics Concern  . Not on file   Social History Narrative   Patient lives at home with wife Aaron [name].    Patient has 3 children.    Patient has a BA.    Review of Systems  Constitutional: Negative for chills, fatigue and fever.  Respiratory: Negative for cough.   Cardiovascular: Negative for chest pain and palpitations.  Gastrointestinal: Negative for diarrhea, nausea and vomiting.  Neurological: Negative for dizziness, light-headedness and headaches.  Psychiatric/Behavioral: Positive for dysphoric mood. Negative for self-injury and suicidal ideas. The patient is not nervous/anxious.        Objective:   Physical Exam  Constitutional: He is oriented to person, place, and time. He appears [name]-developed and [name]-nourished. No distress.  HENT:  Head: Normocephalic and atraumatic.  Eyes: EOM are normal. Pupils are equal, round, and reactive to light.  Neck: Neck supple.  Cardiovascular: Normal rate.   Pulmonary/Chest: Effort normal. No respiratory distress.  Abdominal:  [name] healed scar over right lower abdominal wall, no defect, non tender  Musculoskeletal: Normal range of motion.  Neurological: He is alert and oriented to person, place, and time.  Skin: Skin is warm and dry.  Psychiatric: He has a normal mood and affect. His behavior is normal.  Nursing note and vitals reviewed.   Vitals:   08/06/16 1625  BP: 122/72  Pulse: 61  Resp: 17  Temp: 98.2 F (36.8 C)  TempSrc: Oral  SpO2: 96%  Weight: 191 lb  (86.6 kg)  Height: 5' 11.5" (1.816 m)      Assessment & Plan:   Aaron [name] is a 55 y.o. male Need for prophylactic vaccination and inoculation against influenza - Plan: Flu Vaccine QUAD 36+ mos IM  Depression - Plan: sertraline (ZOLOFT) 50 MG tablet  - Stable. Continue same dose of Zoloft, and can discuss with sleep specialist whether to remain on trazodone at night. If so, can refill that medication as [name].  Scar of abdominal wall  No apparent hernia, may be irritation from previous mesh per surgery eval. RTC precautions if any swelling, bulge noted, or worsening symptoms in that area.  Meds ordered  this encounter  Medications  . sertraline (ZOLOFT) 50 MG tablet    Sig: TAKE 1 TABLET(50 MG) BY MOUTH DAILY    Dispense:  90 tablet    Refill:  1   Patient Instructions     Continue same dose of Zoloft, one per day. No change in other medications for now. After your visit with sleep specialist in November, if you decide to remain on trazodone, let me know. Follow-up with me in 6 months.  If any increased pain or swelling of the scar area on your abdominal wall, I would follow back up with general surgery as discussed.  IF you received an x-[name] today, you will receive an invoice from [facility]. Please contact [facility] at [phone] with questions or concerns regarding your invoice.   IF you received labwork today, you will receive an invoice from [employer]. Please contact [facility] at [phone] with questions or concerns regarding your invoice.   Our billing staff will not be able to assist you with questions regarding bills from these companies.  You will be contacted with the lab results as soon as they are available. The fastest way to get your results is to activate your My Chart account. Instructions are located on the last page of this paperwork. If you have not heard from [place] regarding the results in 2 weeks, please  contact this office.        I personally performed the services described in this documentation, which was scribed in my presence. The recorded information has been reviewed and considered, and addended by me as needed.   Signed,   Aaron Ray, MD [facility].  08/08/16 4:17 PM

## 2016-08-06 NOTE — Patient Instructions (Addendum)
   Continue same dose of Zoloft, one per day. No change in other medications for now. After your visit with sleep specialist in November, if you decide to remain on trazodone, let me know. Follow-up with me in 6 months.  If any increased pain or swelling of the scar area on your abdominal wall, I would follow back up with general surgery as discussed.  IF you received an x-ray today, you will receive an invoice from [facility]. Please contact [facility] at [phone] with questions or concerns regarding your invoice.   IF you received labwork today, you will receive an invoice from [employer]. Please contact [facility] at [phone] with questions or concerns regarding your invoice.   Our billing staff will not be able to assist you with questions regarding bills from these companies.  You will be contacted with the lab results as soon as they are available. The fastest way to get your results is to activate your My Chart account. Instructions are located on the last page of this paperwork. If you have not heard from [place] regarding the results in 2 weeks, please contact this office.

## 2016-08-12 ENCOUNTER — Other Ambulatory Visit: Payer: Self-pay | Admitting: Neurology

## 2016-08-12 MED ORDER — AMPHETAMINE-DEXTROAMPHETAMINE 10 MG PO TABS: ORAL_TABLET | ORAL | 0 refills | Status: DC

## 2016-08-12 NOTE — Telephone Encounter (Signed)
Patient called to request refill of amphetamine-dextroamphetamine (ADDERALL) 10 MG tablet

## 2016-08-12 NOTE — Telephone Encounter (Signed)
I spoke to pt and advised him that his RX for adderall is ready for pick up at the front desk. Pt verbalized understanding.

## 2016-09-09 ENCOUNTER — Telehealth: Payer: Self-pay | Admitting: Neurology

## 2016-09-09 ENCOUNTER — Other Ambulatory Visit: Payer: Self-pay | Admitting: Neurology

## 2016-09-09 MED ORDER — AMPHETAMINE-DEXTROAMPHETAMINE 10 MG PO TABS: ORAL_TABLET | ORAL | 0 refills | Status: DC

## 2016-09-09 NOTE — Telephone Encounter (Signed)
Pt request refill for amphetamine-dextroamphetamine (ADDERALL) 10 MG tablet

## 2016-09-09 NOTE — Telephone Encounter (Signed)
Prescription at front desk for pick up.

## 2016-09-28 ENCOUNTER — Encounter: Payer: Self-pay | Admitting: Neurology

## 2016-09-28 ENCOUNTER — Telehealth: Payer: Self-pay | Admitting: Neurology

## 2016-09-28 MED ORDER — AMPHETAMINE-DEXTROAMPHETAMINE 10 MG PO TABS: ORAL_TABLET | ORAL | 0 refills | Status: DC

## 2016-09-28 NOTE — Telephone Encounter (Signed)
[name] mentioned doing a research related visit today that he needs his Adderall refilled. I did this hereby.CD

## 2016-09-28 NOTE — Progress Notes (Unsigned)
SLEEP MEDICINE CLINIC   Provider:  Larey Seat, M D  Referring Provider: Posey Boyer, MD Primary Care Physician:  Ruben Reason, MD  Interval history for research visit of 09/28/2016, I'm seeing [name] today for research only. This is a no charge visit. The patient has third general well groomed appearance, his ENT exam is normal, he has a Mallampati grade 4, exam of the mouth does not reveal any source, normal-sized thyroid gland without goiter, clear lungs, regular heart rate, no carotid bruits, no abdominal discomfort or tenderness, no extremity swelling, no skin lesions.  The patient reports that for almost a year now he has been well controlled in terms of symptoms of restless leg syndrome. He participated in a study and was likely given the true iron supplement IV. He takes trazodone for sleep induction, Dr. Merri Ray placed him on an antidepressant,  namely Zoloft. He is going through a divorce.   Gillie Crisci, MD       HPI:  Aaron [name] is a 55 y.o. male , seen here as a revisit  from Dr. Linna Darner , following up on a sleep study from 2014 , diagnosed with RLS, insomnia,  I have personally last seen [name] in 2014 after a diagnosed him with restless leg syndrome related insomnia and had explained to him that his apnea was so mild that CPAP intervention was not justified. In the meantime he has followed up for his celiac disease with a specialist at [facility] in [place], Dr. Marissa Nestle.  He has a family history of psoriasis, he was only diagnosed with celiac disease at age 54. He is unaware of a family history of celiac, Crohn's or any colitis. He is concerned about malabsorption. He used to take po Iron, but stopped after reading " it's bad for older folks" .  His son was diagnosed with IBS, negative celiac testing. He has sleeping problems, too.   Sleep habits are as follows:  His usual bedtime is around midnight, he only sleeps about 2-3 hours on  block and then wakes up. His restless legs do no longer bother him that much that he couldn't initiate sleep. He does wake up in the middle of the night however with kicking and he is aware of his mild sleep apnea. Usually he tries to distract himself a little bit watches TV or reads until he is able to fall asleep again. He will get another 2 or 3 hours of sleep and usually sleeps better in the morning hours. He uses Gabapentin for RLS, 400 mg qhs.   Sleep medical history and family sleep history: son has insomnia.  Social history:  Separated. Recently  Patient is not a smoker no tobacco use in any form, 2 mixed drinks a day on average, he drinks coffee only in the morning 2-3 cups a day. No soda, tea is caffeine free  Review of Systems: Out of a complete 14 system review, the patient complains of only the following symptoms, and all other reviewed systems are negative. Hyperactivity, better focus on stimulant - Did not tolerate adderall unless taking it earliest morning time.     [facility] restless leg syndrome quality of life questionnaire: The patient endorsed a mild  impairment over the last 4 weeks;  Restless legs doesn't do not keep him from attending social activities but they trouble him more concentrated in the afternoon and the evening and sometimes also affect his ability to get up in the morning.  Social History   Social History  . Marital status: Married    Spouse name: Mary  . Number of children: 3  . Years of education: N/A   Occupational History  . Manager    Social History Main Topics  . Smoking status: Never Smoker  . Smokeless tobacco: Never Used  . Alcohol use 4.2 oz/week    7 Standard drinks or equivalent per week     Comment: Occassionally  . Drug use: No  . Sexual activity: Yes   Other Topics Concern  . Not on file   Social History Narrative   Patient lives at home with wife Aaron [name].    Patient has 3 children.    Patient has a BA.     Family  History  Problem Relation Age of Onset  . Hypertension Mother   . Diabetes Mother   . Colon cancer Neg Hx   . Hypertension Father   . Heart disease Maternal Grandmother   . Heart disease Maternal Grandfather   . Heart disease Paternal Grandfather   . Colon polyps Neg Hx   . [name] disease Neg Hx   . Esophageal cancer Neg Hx     Past Medical History:  Diagnosis Date  . Celiac disease   . GERD (gastroesophageal reflux disease)   . RLS (restless legs syndrome)   . Sleep apnea syndrome    tested  under [facility] [facility],  12-2-- 2004    Past Surgical History:  Procedure Laterality Date  . APPENDECTOMY    . HERNIA REPAIR      Current Outpatient Prescriptions  Medication Sig Dispense Refill  . amphetamine-dextroamphetamine (ADDERALL) 10 MG tablet May take 1-2 tablets by mouth daily. 60 tablet 0  . Doxycycline Hyclate (ACTICLATE) 150 MG TABS Take 150 mg by mouth.    . ferrous sulfate 325 (65 FE) MG tablet Take 325 mg by mouth daily. 2 times weekly    . fish oil-omega-3 fatty acids 1000 MG capsule Take 2 g by mouth daily.    . fluocinonide (LIDEX) 0.05 % external solution     . gabapentin (NEURONTIN) 300 MG capsule Switch from 400 mg to 300 mg nightly dose after day 5 on IV therapy. 30 capsule 11  . gabapentin (NEURONTIN) 400 MG capsule Take 1 capsule (400 mg total) by mouth at bedtime. 8 capsule 0  . hydrocortisone 2.5 % cream     . sertraline (ZOLOFT) 50 MG tablet TAKE 1 TABLET(50 MG) BY MOUTH DAILY 90 tablet 1  . traZODone (DESYREL) 50 MG tablet TAKE 1 TABLET(50 MG) BY MOUTH AT BEDTIME AS NEEDED FOR SLEEP 30 tablet 5  . zaleplon (SONATA) 10 MG capsule Take 1 capsule (10 mg total) by mouth at bedtime as needed for sleep. 30 capsule 3   No current facility-administered medications for this visit.     Allergies as of 09/28/2016  . (No Known Allergies)    Vitals: There were no vitals taken for this visit. Last Weight:  Wt Readings from Last 1 Encounters:  08/06/16  191 lb (86.6 kg)   ZOX:WRUEA is no height or weight on file to calculate BMI.     Last Height:   Ht Readings from Last 1 Encounters:  08/06/16 5' 11.5" (1.816 m)   His symptoms have not begun starting earlier each day, he has no more attenuation. Symptoms have not become more intense. There are not more body parts involved for example not upper extremities, has not increased leg movements.  He now lives alone which means that there is no outside witness to his sleep pattern or movements, but the medicine seems still to work 11 months on.  Rv in 6 month with ADHD follow up.    Asencion Partridge Dupree Givler MD  09/28/2016   CC: Posey Boyer, [address], [place] [address]

## 2016-09-29 ENCOUNTER — Encounter: Payer: Self-pay | Admitting: Neurology

## 2016-10-06 ENCOUNTER — Telehealth: Payer: Self-pay | Admitting: Neurology

## 2016-10-06 NOTE — Telephone Encounter (Signed)
Pt called said he rec'd a no show for 11/21. He said he was at the appt, he saw Levora Angel, the Dr Dohmeier. He is requesting that it be corrected and he be called with the status.  I sent Angie an email

## 2016-11-05 ENCOUNTER — Other Ambulatory Visit: Payer: Self-pay | Admitting: Family Medicine

## 2016-11-05 DIAGNOSIS — F329 Major depressive disorder, single episode, unspecified: Secondary | ICD-10-CM

## 2016-11-12 ENCOUNTER — Telehealth: Payer: Self-pay | Admitting: Neurology

## 2016-11-12 NOTE — Telephone Encounter (Signed)
Patient requesting refill of  amphetamine-dextroamphetamine (ADDERALL) 10 MG tablet. I advised [name] will be in on Monday 11-16-15 and call before picking up.

## 2016-11-15 ENCOUNTER — Other Ambulatory Visit: Payer: Self-pay | Admitting: Neurology

## 2016-11-15 MED ORDER — AMPHETAMINE-DEXTROAMPHETAMINE 10 MG PO TABS: ORAL_TABLET | ORAL | 0 refills | Status: DC

## 2016-11-15 NOTE — Addendum Note (Signed)
Addended by: Lester Canjilon A on: 11/15/2016 09:29 AM   Modules accepted: Orders

## 2016-11-15 NOTE — Telephone Encounter (Signed)
I called pt and advised him that his RX for adderall is ready for pick up at the front desk and gave clinic hours. Pt verbalized understanding.

## 2016-11-15 NOTE — Telephone Encounter (Signed)
Pt called wanting to know if RX is ready. He was advised to call back around 3:30

## 2016-11-16 ENCOUNTER — Encounter: Payer: Self-pay | Admitting: Neurology

## 2016-11-16 DIAGNOSIS — F909 Attention-deficit hyperactivity disorder, unspecified type: Secondary | ICD-10-CM | POA: Insufficient documentation

## 2016-11-17 ENCOUNTER — Other Ambulatory Visit: Payer: Self-pay | Admitting: Neurology

## 2016-12-13 ENCOUNTER — Telehealth: Payer: Self-pay | Admitting: Neurology

## 2016-12-13 MED ORDER — AMPHETAMINE-DEXTROAMPHETAMINE 10 MG PO TABS: ORAL_TABLET | ORAL | 0 refills | Status: DC

## 2016-12-13 NOTE — Addendum Note (Signed)
Addended by: Laurence Spates on: 12/13/2016 05:07 PM   Modules accepted: Orders

## 2016-12-13 NOTE — Telephone Encounter (Signed)
Patient requesting refill for amphetamine-dextroamphetamine (ADDERALL) 10 MG tablet.

## 2016-12-13 NOTE — Telephone Encounter (Signed)
Rx printed, he saw [name] during a study at our office. Waiting for signature.

## 2016-12-14 NOTE — Telephone Encounter (Signed)
Rx ready at the front desk for pick up

## 2017-01-10 ENCOUNTER — Other Ambulatory Visit: Payer: Self-pay | Admitting: Neurology

## 2017-01-10 MED ORDER — AMPHETAMINE-DEXTROAMPHETAMINE 10 MG PO TABS: ORAL_TABLET | ORAL | 0 refills | Status: DC

## 2017-01-10 NOTE — Telephone Encounter (Signed)
I called pt. I advised him that his adderall is ready for pick up at the front desk. Pt verbalized understanding.

## 2017-01-10 NOTE — Telephone Encounter (Signed)
Pt request refill for amphetamine-dextroamphetamine (ADDERALL) 10 MG tablet

## 2017-02-09 ENCOUNTER — Telehealth: Payer: Self-pay | Admitting: Neurology

## 2017-02-09 ENCOUNTER — Other Ambulatory Visit: Payer: Self-pay | Admitting: Family Medicine

## 2017-02-09 DIAGNOSIS — F329 Major depressive disorder, single episode, unspecified: Secondary | ICD-10-CM

## 2017-02-09 MED ORDER — AMPHETAMINE-DEXTROAMPHETAMINE 10 MG PO TABS: ORAL_TABLET | ORAL | 0 refills | Status: DC

## 2017-02-09 NOTE — Telephone Encounter (Signed)
Patient requesting refill of amphetamine-dextroamphetamine (ADDERALL) 10 MG tablet.

## 2017-02-09 NOTE — Addendum Note (Signed)
Addended by: Laurence Spates on: 02/09/2017 11:07 AM   Modules accepted: Orders

## 2017-02-09 NOTE — Telephone Encounter (Signed)
Rx printed, [name] can sign tomorrow when she gets back. I will place at front desk once it is signed.

## 2017-02-10 NOTE — Telephone Encounter (Signed)
I spoke to patient and let him know that his Rx is at the [facility] ready for p/u

## 2017-03-07 ENCOUNTER — Other Ambulatory Visit: Payer: Self-pay | Admitting: Neurology

## 2017-03-10 ENCOUNTER — Other Ambulatory Visit: Payer: Self-pay | Admitting: Neurology

## 2017-03-10 MED ORDER — AMPHETAMINE-DEXTROAMPHETAMINE 10 MG PO TABS: ORAL_TABLET | ORAL | 0 refills | Status: DC

## 2017-03-10 NOTE — Addendum Note (Signed)
Addended by: Lester Hilmar-Irwin A on: 03/10/2017 12:07 PM   Modules accepted: Orders

## 2017-03-10 NOTE — Telephone Encounter (Signed)
I called pt. I advised him that his RX for adderall is ready for pick up at the front desk. I also advised him that I will need to schedule him for his yearly follow up. Pt declined to see the NP and wants to see [name] in August. Appt scheduled. Pt verbalized understanding of new appt date and time and that RX is ready for pick up at the front desk.

## 2017-03-10 NOTE — Telephone Encounter (Signed)
Pt calling for refill of amphetamine-dextroamphetamine (ADDERALL) 10 MG tablet

## 2017-04-08 ENCOUNTER — Other Ambulatory Visit: Payer: Self-pay | Admitting: Neurology

## 2017-04-13 ENCOUNTER — Other Ambulatory Visit: Payer: Self-pay | Admitting: Neurology

## 2017-04-13 MED ORDER — AMPHETAMINE-DEXTROAMPHETAMINE 10 MG PO TABS: ORAL_TABLET | ORAL | 0 refills | Status: DC

## 2017-04-13 NOTE — Telephone Encounter (Signed)
Pt has an appt scheduled for 07/07/17. Will send to [name] for review.

## 2017-04-13 NOTE — Telephone Encounter (Signed)
Called pt. Advised rx adderall ready for pick up. He verbalized understanding.

## 2017-04-13 NOTE — Telephone Encounter (Signed)
Pt request refill for amphetamine-dextroamphetamine (ADDERALL) 10 MG tablet

## 2017-05-06 ENCOUNTER — Other Ambulatory Visit: Payer: Self-pay | Admitting: Urgent Care

## 2017-05-06 DIAGNOSIS — F329 Major depressive disorder, single episode, unspecified: Secondary | ICD-10-CM

## 2017-05-08 NOTE — Telephone Encounter (Signed)
Refill req for sertraline.  To Aaron [name]

## 2017-05-12 ENCOUNTER — Other Ambulatory Visit: Payer: Self-pay | Admitting: Neurology

## 2017-05-12 ENCOUNTER — Other Ambulatory Visit: Payer: Self-pay

## 2017-05-12 DIAGNOSIS — F329 Major depressive disorder, single episode, unspecified: Secondary | ICD-10-CM

## 2017-05-12 MED ORDER — AMPHETAMINE-DEXTROAMPHETAMINE 10 MG PO TABS: ORAL_TABLET | ORAL | 0 refills | Status: DC

## 2017-05-12 MED ORDER — SERTRALINE HCL 50 MG PO TABS: 50.0000 mg | ORAL_TABLET | Freq: Every day | ORAL | 0 refills | Status: DC

## 2017-05-12 NOTE — Addendum Note (Signed)
Addended by: Darleen Crocker on: 05/12/2017 03:34 PM   Modules accepted: Orders

## 2017-05-12 NOTE — Telephone Encounter (Signed)
Patient requesting refill of amphetamine-dextroamphetamine (ADDERALL) 10 MG tablet.

## 2017-05-12 NOTE — Telephone Encounter (Signed)
Called pt to let him know that his prescription was ready for pickup. No answer. LVM stating it was ready and if he had any further questions to call us back.

## 2017-05-12 NOTE — Telephone Encounter (Signed)
mychart message sent to pt about making an appt

## 2017-06-03 ENCOUNTER — Ambulatory Visit (INDEPENDENT_AMBULATORY_CARE_PROVIDER_SITE_OTHER): Payer: BLUE CROSS/BLUE SHIELD | Admitting: Family Medicine

## 2017-06-03 ENCOUNTER — Encounter: Payer: Self-pay | Admitting: Family Medicine

## 2017-06-03 VITALS — BP 147/93 | HR 57 | Temp 97.4°F | Resp 16 | Ht 71.0 in | Wt 200.0 lb

## 2017-06-03 DIAGNOSIS — Z13220 Encounter for screening for lipoid disorders: Secondary | ICD-10-CM

## 2017-06-03 DIAGNOSIS — Z7251 High risk heterosexual behavior: Secondary | ICD-10-CM

## 2017-06-03 DIAGNOSIS — Z114 Encounter for screening for human immunodeficiency virus [HIV]: Secondary | ICD-10-CM

## 2017-06-03 DIAGNOSIS — Z0000 Encounter for general adult medical examination without abnormal findings: Secondary | ICD-10-CM

## 2017-06-03 DIAGNOSIS — G4700 Insomnia, unspecified: Secondary | ICD-10-CM

## 2017-06-03 DIAGNOSIS — R635 Abnormal weight gain: Secondary | ICD-10-CM

## 2017-06-03 DIAGNOSIS — R21 Rash and other nonspecific skin eruption: Secondary | ICD-10-CM

## 2017-06-03 DIAGNOSIS — F329 Major depressive disorder, single episode, unspecified: Secondary | ICD-10-CM | POA: Diagnosis not present

## 2017-06-03 DIAGNOSIS — Z23 Encounter for immunization: Secondary | ICD-10-CM | POA: Diagnosis not present

## 2017-06-03 DIAGNOSIS — G2581 Restless legs syndrome: Secondary | ICD-10-CM | POA: Diagnosis not present

## 2017-06-03 DIAGNOSIS — Z1159 Encounter for screening for other viral diseases: Secondary | ICD-10-CM | POA: Diagnosis not present

## 2017-06-03 DIAGNOSIS — Z125 Encounter for screening for malignant neoplasm of prostate: Secondary | ICD-10-CM

## 2017-06-03 DIAGNOSIS — Z131 Encounter for screening for diabetes mellitus: Secondary | ICD-10-CM | POA: Diagnosis not present

## 2017-06-03 DIAGNOSIS — F32A Depression, unspecified: Secondary | ICD-10-CM

## 2017-06-03 DIAGNOSIS — F988 Other specified behavioral and emotional disorders with onset usually occurring in childhood and adolescence: Secondary | ICD-10-CM | POA: Diagnosis not present

## 2017-06-03 DIAGNOSIS — R001 Bradycardia, unspecified: Secondary | ICD-10-CM

## 2017-06-03 MED ORDER — SERTRALINE HCL 50 MG PO TABS: 50.0000 mg | ORAL_TABLET | Freq: Every day | ORAL | 1 refills | Status: DC

## 2017-06-03 MED ORDER — ZOSTER VAC RECOMB ADJUVANTED 50 MCG/0.5ML IM SUSR: 0.5000 mL | Freq: Once | INTRAMUSCULAR | 1 refills | Status: AC

## 2017-06-03 MED ORDER — TRIAMCINOLONE ACETONIDE 0.1 % EX CREA: 1.0000 "application " | TOPICAL_CREAM | Freq: Two times a day (BID) | CUTANEOUS | 0 refills | Status: DC

## 2017-06-03 NOTE — Patient Instructions (Addendum)
I referred you to dermatology and placed a referral to [name].   For rash on face, genitals, can try over-the-counter cortisone until seen by dermatology. I also prescribed a stronger steroid cream for arms, scalp up to twice per day if needed. Dove or other mild cleanser such as cetaphil may be best for now.   Tdap given today, and shingles vaccine was sent to your pharmacy (that will need to be repeated in 2-6 months).   I will check thyroid test, but please return to discuss weight gain further in next month.  I would like to talk more about depression at that visit as well.    Keep a record of your blood pressures outside of the office and bring them to the next office visit.  I would recommend appointment with eye care provider.   Keeping you healthy  Get these tests  Blood pressure- Have your blood pressure checked once a year by your healthcare provider.  Normal blood pressure is 120/80  Weight- Have your body mass index (BMI) calculated to screen for obesity.  BMI is a measure of body fat based on height and weight. You can also calculate your own BMI at [employer].  Cholesterol- Have your cholesterol checked every year.  Diabetes- Have your blood sugar checked regularly if you have high blood pressure, high cholesterol, have a family history of diabetes or if you are overweight.  Screening for Colon Cancer- Colonoscopy starting at age 75.  Screening may begin sooner depending on your family history and other health conditions. Follow up colonoscopy as directed by your Gastroenterologist.  Screening for Prostate Cancer- Both blood work (PSA) and a rectal exam help screen for Prostate Cancer.  Screening begins at age 79 with African-American men and at age 83 with Caucasian men.  Screening may begin sooner depending on your family history.  Take these medicines  Aspirin- One aspirin daily can help prevent Heart disease and Stroke.  Flu shot- Every  fall.  Tetanus- Every 10 years.  Zostavax- Once after the age of 60 to prevent Shingles.  Pneumonia shot- Once after the age of 32; if you are younger than 81, ask your healthcare provider if you need a Pneumonia shot.  Take these steps  Don't smoke- If you do smoke, talk to your doctor about quitting.  For tips on how to quit, go to www.smokefree.gov or call 1-800-QUIT-NOW.  Be physically active- Exercise 5 days a week for at least 30 minutes.  If you are not already physically active start slow and gradually work up to 30 minutes of moderate physical activity.  Examples of moderate activity include walking briskly, mowing the yard, dancing, swimming, bicycling, etc.  Eat a healthy diet- Eat a variety of healthy food such as fruits, vegetables, low fat milk, low fat cheese, yogurt, lean meant, poultry, fish, beans, tofu, etc. For more information go to www.thenutritionsource.org  Drink alcohol in moderation- Limit alcohol intake to less than two drinks a day. Never drink and drive.  Dentist- Brush and floss twice daily; visit your dentist twice a year.  Depression- Your emotional health is as important as your physical health. If you're feeling down, or losing interest in things you would normally enjoy please talk to your healthcare provider.  Eye exam- Visit your eye doctor every year.  Safe sex- If you may be exposed to a sexually transmitted infection, use a condom.  Seat belts- Seat belts can save your life; always wear one.  Smoke/Carbon Monoxide detectors-  These detectors need to be installed on the appropriate level of your home.  Replace batteries at least once a year.  Skin cancer- When out in the sun, cover up and use sunscreen 15 SPF or higher.  Violence- If anyone is threatening you, please tell your healthcare provider.  Living Will/ Health care power of attorney- Speak with your healthcare provider and family.  IF you received an x-ray today, you will receive an  invoice from [facility]. Please contact [facility] Radiology at [phone] with questions or concerns regarding your invoice.   IF you received labwork today, you will receive an invoice from [facility]. Please contact [facility] at [phone] with questions or concerns regarding your invoice.   Our billing staff will not be able to assist you with questions regarding bills from these companies.  You will be contacted with the lab results as soon as they are available. The fastest way to get your results is to activate your My Chart account. Instructions are located on the last page of this paperwork. If you have not heard from [place] regarding the results in 2 weeks, please contact this office.

## 2017-06-03 NOTE — Progress Notes (Addendum)
Subjective:  By signing my name below, I, [name], attest that this documentation has been prepared under the direction and in the presence of Aaron Agreste, MD Electronically Signed: Ladene Artist, ED Scribe 06/03/2017 at 10:29 AM.   Patient ID: Aaron [name], male    DOB: [birth year], 56 y.o.   MRN: [id number]  Chief Complaint  Patient presents with  . Medication Refill    zoloft   . Referral request    neurology, dermatologist   HPI [name] is a 56 y.o. male who presents to Primary Care at [facility] for initially CPE but has multiple concerns to be addressed today. Last visit with me in September 2017. H/o depression, RLS, insomnia, OSA. He has been followed by [name] for OSA, insomnia and RLS and has been treated for ADHD with [name] with Adderall 10 mg 1-2 qd, last prescription for #60 on 7/5. H/o celiac disease, followed by Dr. Fuller Plan.  Depression Discussed in September of last year. Started on Zoloft in June with depression symptoms with separation from his wife. Was improved with Zoloft at that visit. Still taking Trazodone at night for insomnia. Met with divorce support group and counselor. Continued on Zoloft 50 mg qd and planned for f/u in 6 months. Continued on both Trazodone and Zoloft. Pt is not using a c-pap.   Pt states that Zoloft has improved depression; states "I have good days and bad days". But he has noticed a significant amount of weight gain. Pt states that he doesn't really eat healthy but he exercises daily. Reports running, lifting weights and swimming daily in the summer. Denies SI.  Depression screen [facility] 2/9 06/03/2017 08/06/2016 05/03/2016 08/28/2015  Decreased Interest 1 0 1 0  Down, Depressed, Hopeless 3 0 2 0  PHQ - 2 Score 4 0 3 0  Altered sleeping 3 - 0 -  Tired, decreased energy 3 - 3 -  Change in appetite 0 - 3 -  Feeling bad or failure about yourself  3 - 3 -  Trouble concentrating 3 - 3 -  Moving slowly or  fidgety/restless 3 - 0 -  Suicidal thoughts 0 - 0 -  PHQ-9 Score 19 - 15 -  Difficult doing work/chores Very difficult - - -   Wt Readings from Last 3 Encounters:  06/03/17 200 lb (90.7 kg)  08/06/16 191 lb (86.6 kg)  05/03/16 183 lb (83 kg)   BP/HR Pt has noticed fluctuation in his BP and HR. He denies light-headedness or dizziness.   Request Referral to Dermatology Pt reports intermittent pruritic rash to his beard, back of head and side of penis x 2-3 years ago. States rash is similar to acne but also "bubbles up". Pt was seen in May 2016 to skin surgery center, Dr. Allyson Sabal and was told his rash was likely folliculitis or psoriasis. He has tried pills, gels and prescription creams without significant relief. States he has been using Dove or other mild soaps.   Request Neuro Referral  Pt needs a referral to [name] for insomnia, RLS and ADHD.   Health Maintenance  UTD on colonoscopy. Due for tdap, Hep C screening and HIV screening. He had 1 new partner with unprotected intercourse ~1 month ago.Pt also requests Shingles vaccine at this visit. Pt is fasting at this visit.   Immunization History  Administered Date(s) Administered  . Influenza Whole 08/02/2013  . Influenza,inj,Quad PF,36+ Mos 12/11/2014, 08/06/2016  . Pneumococcal Polysaccharide-23 12/11/2014  . Td 11/08/2006  CA Screening Colonoscopy: UTD Prostate CA: Lab Results  Component Value Date   PSA 0.56 07/20/2013   PSA 0.64 06/16/2009   PSA 0.53 06/05/2008   Dentist: followed regularly Vision: lasik eye surgery ~20 years ago. Has not been seen within the past year. No exam data present  Patient Active Problem List   Diagnosis Date Noted  . ADHD   . Insomnia 01/09/2015  . Sleep apnea syndrome   . RLS (restless legs syndrome)   . GERD 08/19/2009  . GASTRITIS 08/19/2009  . CELIAC SPRUE 07/23/2009  . DIARRHEA 07/02/2009  . INSOMNIA-SLEEP DISORDER-UNSPEC 06/30/2009  . ABDOMINAL PAIN OTHER SPECIFIED SITE  06/30/2009  . DEPRESSION 06/05/2008  . OBSTRUCTIVE SLEEP APNEA 06/05/2008  . RESTLESS LEGS SYNDROME 06/05/2008   Past Medical History:  Diagnosis Date  . ADHD   . Celiac disease   . GERD (gastroesophageal reflux disease)   . RLS (restless legs syndrome)   . Sleep apnea syndrome    tested  under [place] lab,  12-2-- 2004   Past Surgical History:  Procedure Laterality Date  . APPENDECTOMY    . HERNIA REPAIR     No Known Allergies Prior to Admission medications   Medication Sig Start Date End Date Taking? Authorizing Provider  amphetamine-dextroamphetamine (ADDERALL) 10 MG tablet May take 1-2 tablets by mouth daily. 05/12/17   Dohmeier, Asencion Partridge, MD  Doxycycline Hyclate (ACTICLATE) 150 MG TABS Take 150 mg by mouth.    [provider]  ferrous sulfate 325 (65 FE) MG tablet Take 325 mg by mouth daily. 2 times weekly    [provider]  fish oil-omega-3 fatty acids 1000 MG capsule Take 2 g by mouth daily.    [provider]  fluocinonide (LIDEX) 0.05 % external solution  07/31/15   [provider]  gabapentin (NEURONTIN) 300 MG capsule Switch from 400 mg to 300 mg nightly dose after day 5 on IV therapy. 09/29/15   Dohmeier, Asencion Partridge, MD  gabapentin (NEURONTIN) 400 MG capsule Take 1 capsule (400 mg total) by mouth at bedtime. 09/29/15   Dohmeier, Asencion Partridge, MD  hydrocortisone 2.5 % cream  07/31/15   [provider]  sertraline (ZOLOFT) 50 MG tablet Take 1 tablet (50 mg total) by mouth daily. Office visit needed for additional refills. 2nd notice. 05/12/17   Jaynee Eagles, PA-C  traZODone (DESYREL) 50 MG tablet TAKE 1 TABLET(50 MG) BY MOUTH AT BEDTIME AS NEEDED FOR SLEEP 04/08/17   Dohmeier, Asencion Partridge, MD   Social History   Social History  . Marital status: Married    Spouse name: Mary  . Number of children: 3  . Years of education: N/A   Occupational History  . Manager    Social History Main Topics  . Smoking status: Never Smoker  .  Smokeless tobacco: Never Used  . Alcohol use 4.2 oz/week    7 Standard drinks or equivalent per week     Comment: Occassionally  . Drug use: No  . Sexual activity: Yes   Other Topics Concern  . Not on file   Social History Narrative   Patient lives at home with wife [name].    Patient has 3 children.    Patient has a BA.    Review of Systems  Constitutional: Positive for unexpected weight change.  Skin: Positive for rash.  Neurological: Negative for dizziness and light-headedness.  Psychiatric/Behavioral: Positive for dysphoric mood. Negative for suicidal ideas.      Objective:   Physical Exam  Constitutional: He is oriented to person, place, and time. He appears well-developed and well-nourished.  HENT:  Head: Normocephalic and atraumatic.  Right Ear: External ear normal.  Left Ear: External ear normal.  Mouth/Throat: Oropharynx is clear and moist.  Eyes: Pupils are equal, round, and reactive to light. Conjunctivae and EOM are normal.  Neck: Normal range of motion. Neck supple. No thyromegaly present.  Cardiovascular: Normal rate, regular rhythm, normal heart sounds and intact distal pulses.   Pulmonary/Chest: Effort normal and breath sounds normal. No respiratory distress. He has no wheezes.  Abdominal: Soft. He exhibits no distension. There is no tenderness. Hernia confirmed negative in the right inguinal area and confirmed negative in the left inguinal area.  Genitourinary: Prostate normal.  Musculoskeletal: Normal range of motion. He exhibits no edema or tenderness.  Lymphadenopathy:    He has no cervical adenopathy.  Neurological: He is alert and oriented to person, place, and time. He has normal reflexes.  Skin: Skin is warm and dry.  Abraded irritation around the face, lower posterior scalp, few crusted hyperpigmented areas around volar forearm. Few excoriated areas around upper genital area. Appears to spare penis and lower scrotum. Some lesions on upper scrotum. No  vesicles. No discharge.  Psychiatric: He has a normal mood and affect. His behavior is normal.  Vitals reviewed.   Vitals:   06/03/17 1011  BP: (!) 147/93  Pulse: (!) 57  Resp: 16  Temp: (!) 97.4 F (36.3 C)  TempSrc: Oral  SpO2: 94%  Weight: 200 lb (90.7 kg)  Height: _0  (1.803 m)   EKG reading done by Aaron Agreste, MD: Sinus bradycardia rate of 49. No acute findings.     Assessment & Plan:   SILVIA MARKUSON is a 56 y.o. male Annual physical exam  - -anticipatory guidance as below in AVS, screening labs above. Health maintenance items as above in HPI discussed/recommended as applicable.   Insomnia, unspecified type - Plan: Ambulatory referral to Sleep Studies RLS (restless legs syndrome) - Plan: Ambulatory referral to Sleep Studies Attention deficit disorder, unspecified hyperactivity presence - Plan: Ambulatory referral to Sleep Studies  - Under care of sleep specialist. New referral placed as needed for insurance purposes. No change in plan  Reactive depression - Plan: sertraline (ZOLOFT) 50 MG tablet Depression, unspecified depression type - Plan: TSH  - Reports overall stable symptoms, will continue Zoloft at same dose for now, check TSH with history of depression and reports of recent weight gain.  Screening for prostate cancer - Plan: PSA  Screening for hyperlipidemia - Plan: Comprehensive metabolic panel, Lipid panel  Screening for diabetes mellitus - Plan: Comprehensive metabolic panel  Encounter for hepatitis C screening test for low risk patient - Plan: Hepatitis C antibody  Encounter for screening for HIV - Plan: HIV antibody  Need for Tdap vaccination - Plan: Tdap vaccine greater than or equal to 7yo IM  Rash - Plan: Ambulatory referral to Dermatology, HSV(herpes simplex vrs) 1+2 ab-IgG, triamcinolone cream (KENALOG) 0.1 %  - Based on location of genital rash, will check HSV testing, but differential includes psoriasis has similar-appearing lesions  on forearms. Face rash may be a folliculitis.  -Cortisone over-the-counter for sensitive areas including face and genitalia, triamcinolone if needed for neck, chest or extremities.  -Refer to dermatology for further evaluation/treatment  Need for shingles vaccine - Plan: Zoster Vac Recomb Adjuvanted [facility]) injection sent to pharmacy  Weight gain - Plan: TSH  History of unprotected sex - Plan: HIV antibody,  HSV(herpes simplex vrs) 1+2 ab-IgG, GC/Chlamydia Probe Amp, RPR  - Routine STI testing obtained. HSV testing with genital rash as above  Bradycardia - Plan: EKG 12-Lead  -Frequently exercises, and baby B athletic heart. Asymptomatic, blood pressure stable, and actually borderline elevated. Can recheck next visit. Recommended recheck in one month.  Meds ordered this encounter  Medications  . Zoster Vac Recomb Adjuvanted [facility]) injection    Sig: Inject 0.5 mLs into the muscle once. Repeat injection once in 2-6 months.    Dispense:  0.5 mL    Refill:  1  . sertraline (ZOLOFT) 50 MG tablet    Sig: Take 1 tablet (50 mg total) by mouth daily. Office visit needed for additional refills. 2nd notice.    Dispense:  90 tablet    Refill:  1  . triamcinolone cream (KENALOG) 0.1 %    Sig: Apply 1 application topically 2 (two) times daily.    Dispense:  30 g    Refill:  0   Patient Instructions    I referred you to dermatology and placed a referral to [name].   For rash on face, genitals, can try over-the-counter cortisone until seen by dermatology. I also prescribed a stronger steroid cream for arms, scalp up to twice per day if needed. Dove or other mild cleanser such as cetaphil may be best for now.   Tdap given today, and shingles vaccine was sent to your pharmacy (that will need to be repeated in 2-6 months).   I will check thyroid test, but please return to discuss weight gain further in next month.  I would like to talk more about depression at that visit as well.     Keep a record of your blood pressures outside of the office and bring them to the next office visit.  I would recommend appointment with eye care provider.   Keeping you healthy  Get these tests  Blood pressure- Have your blood pressure checked once a year by your healthcare provider.  Normal blood pressure is 120/80  Weight- Have your body mass index (BMI) calculated to screen for obesity.  BMI is a measure of body fat based on height and weight. You can also calculate your own BMI at ViewBanking.si.  Cholesterol- Have your cholesterol checked every year.  Diabetes- Have your blood sugar checked regularly if you have high blood pressure, high cholesterol, have a family history of diabetes or if you are overweight.  Screening for Colon Cancer- Colonoscopy starting at age 4.  Screening may begin sooner depending on your family history and other health conditions. Follow up colonoscopy as directed by your Gastroenterologist.  Screening for Prostate Cancer- Both blood work (PSA) and a rectal exam help screen for Prostate Cancer.  Screening begins at age 23 with African-American men and at age 17 with Caucasian men.  Screening may begin sooner depending on your family history.  Take these medicines  Aspirin- One aspirin daily can help prevent Heart disease and Stroke.  Flu shot- Every fall.  Tetanus- Every 10 years.  Zostavax- Once after the age of 45 to prevent Shingles.  Pneumonia shot- Once after the age of 93; if you are younger than 65, ask your healthcare provider if you need a Pneumonia shot.  Take these steps  Don't smoke- If you do smoke, talk to your doctor about quitting.  For tips on how to quit, go to www.smokefree.gov or call 1-800-QUIT-NOW.  Be physically active- Exercise 5 days a week for at  least 30 minutes.  If you are not already physically active start slow and gradually work up to 30 minutes of moderate physical activity.  Examples of moderate  activity include walking briskly, mowing the yard, dancing, swimming, bicycling, etc.  Eat a healthy diet- Eat a variety of healthy food such as fruits, vegetables, low fat milk, low fat cheese, yogurt, lean meant, poultry, fish, beans, tofu, etc. For more information go to www.thenutritionsource.org  Drink alcohol in moderation- Limit alcohol intake to less than two drinks a day. Never drink and drive.  Dentist- Brush and floss twice daily; visit your dentist twice a year.  Depression- Your emotional health is as important as your physical health. If you're feeling down, or losing interest in things you would normally enjoy please talk to your healthcare provider.  Eye exam- Visit your eye doctor every year.  Safe sex- If you may be exposed to a sexually transmitted infection, use a condom.  Seat belts- Seat belts can save your life; always wear one.  Smoke/Carbon Monoxide detectors- These detectors need to be installed on the appropriate level of your home.  Replace batteries at least once a year.  Skin cancer- When out in the sun, cover up and use sunscreen 15 SPF or higher.  Violence- If anyone is threatening you, please tell your healthcare provider.  Living Will/ Health care power of attorney- Speak with your healthcare provider and family.  IF you received an x-ray today, you will receive an invoice from [facility]. Please contact [facility] at [phone] with questions or concerns regarding your invoice.   IF you received labwork today, you will receive an invoice from [facility]. Please contact [facility] at [phone] with questions or concerns regarding your invoice.   Our billing staff will not be able to assist you with questions regarding bills from these companies.  You will be contacted with the lab results as soon as they are available. The fastest way to get your results is to activate your My Chart account. Instructions are located on the last page  of this paperwork. If you have not heard from [place] regarding the results in 2 weeks, please contact this office.       I personally performed the services described in this documentation, which was scribed in my presence. The recorded information has been reviewed and considered for accuracy and completeness, addended by me as needed, and agree with information above.  Signed,   Merri Ray, MD Primary Care at [facility].  06/03/17 4:30 PM

## 2017-06-04 LAB — COMPREHENSIVE METABOLIC PANEL
ALT: 32 IU/L (ref 0–44)
AST: 36 IU/L (ref 0–40)
Albumin/Globulin Ratio: 1.7 (ref 1.2–2.2)
Albumin: 4.4 g/dL (ref 3.5–5.5)
Alkaline Phosphatase: 101 IU/L (ref 39–117)
BUN/Creatinine Ratio: 9 (ref 9–20)
BUN: 10 mg/dL (ref 6–24)
Bilirubin Total: 0.5 mg/dL (ref 0.0–1.2)
CO2: 25 mmol/L (ref 20–29)
Calcium: 9.6 mg/dL (ref 8.7–10.2)
Chloride: 101 mmol/L (ref 96–106)
Creatinine, Ser: 1.14 mg/dL (ref 0.76–1.27)
GFR calc Af Amer: 81 mL/min/{1.73_m2} (ref >59)
GFR calc non Af Amer: 70 mL/min/{1.73_m2} (ref >59)
Globulin, Total: 2.6 g/dL (ref 1.5–4.5)
Glucose: 87 mg/dL (ref 65–99)
Potassium: 4.2 mmol/L (ref 3.5–5.2)
Sodium: 140 mmol/L (ref 134–144)
Total Protein: 7 g/dL (ref 6.0–8.5)

## 2017-06-04 LAB — LIPID PANEL
Chol/HDL Ratio: 3 ratio (ref 0.0–5.0)
Cholesterol, Total: 146 mg/dL (ref 100–199)
HDL: 49 mg/dL (ref >39)
LDL Calculated: 71 mg/dL (ref 0–99)
Triglycerides: 132 mg/dL (ref 0–149)
VLDL Cholesterol Cal: 26 mg/dL (ref 5–40)

## 2017-06-04 LAB — HEPATITIS C ANTIBODY: Hep C Virus Ab: <0.1 s/co ratio (ref 0.0–0.9)

## 2017-06-04 LAB — PSA: Prostate Specific Ag, Serum: 0.8 ng/mL (ref 0.0–4.0)

## 2017-06-04 LAB — HSV(HERPES SIMPLEX VRS) I + II AB-IGG
HSV 1 Glycoprotein G Ab, IgG: 1.46 index — ABNORMAL HIGH (ref 0.00–0.90)
HSV 2 Glycoprotein G Ab, IgG: <0.91 index (ref 0.00–0.90)

## 2017-06-04 LAB — HIV ANTIBODY (ROUTINE TESTING W REFLEX): HIV Screen 4th Generation wRfx: NONREACTIVE

## 2017-06-04 LAB — RPR: RPR Ser Ql: NONREACTIVE

## 2017-06-04 LAB — TSH: TSH: 2.14 u[IU]/mL (ref 0.450–4.500)

## 2017-06-06 ENCOUNTER — Telehealth: Payer: Self-pay | Admitting: Family Medicine

## 2017-06-06 LAB — GC/CHLAMYDIA PROBE AMP
Chlamydia trachomatis, NAA: NEGATIVE
Neisseria gonorrhoeae by PCR: NEGATIVE

## 2017-06-06 NOTE — Telephone Encounter (Signed)
Can keep that follow up. Patient asked for referral as apparently new insurance. Didn't know if they needed a new referral. Ok to keep same appt. Thanks.

## 2017-06-06 NOTE — Telephone Encounter (Signed)
Sheena from [place] called concerning pt referral. She said pt is already established with Dr. Westley Hummer and has follow up appt scheduled for 07/07/17. She said if she schedules from the referral it would be for an OV but pt already has one for this date. Does pt need another appt or just the one scheduled? Please advise.

## 2017-06-09 NOTE — Telephone Encounter (Signed)
Sheena at [place] advised 8/2

## 2017-06-10 ENCOUNTER — Telehealth: Payer: Self-pay | Admitting: Family Medicine

## 2017-06-10 NOTE — Telephone Encounter (Signed)
Pt called and left vm with information on where he would like Dermatology referral sent. I called back and left a message letting him know we sent it to [facility] per his request. He also wanted to know if he had a tetanus shot at his last visit with our office. He said he asked for one but was not sure if he received one or not. Please advise. Pt can be contacted at [phone].

## 2017-06-10 NOTE — Telephone Encounter (Signed)
Spoke with patient and advised that dermatology referral was sent to his request [facility]) and that he did received Tdap immunization at his last visit.

## 2017-06-13 ENCOUNTER — Other Ambulatory Visit: Payer: Self-pay | Admitting: Neurology

## 2017-06-13 ENCOUNTER — Telehealth: Payer: Self-pay | Admitting: Neurology

## 2017-06-13 MED ORDER — AMPHETAMINE-DEXTROAMPHETAMINE 10 MG PO TABS: ORAL_TABLET | ORAL | 0 refills | Status: DC

## 2017-06-13 NOTE — Telephone Encounter (Signed)
Prescription at the front desk ready for pick up.

## 2017-06-13 NOTE — Telephone Encounter (Signed)
Pt calling for refill of amphetamine-dextroamphetamine (ADDERALL) 10 MG tablet

## 2017-07-07 ENCOUNTER — Ambulatory Visit: Payer: Self-pay | Admitting: Neurology

## 2017-07-07 ENCOUNTER — Telehealth: Payer: Self-pay | Admitting: Neurology

## 2017-07-07 NOTE — Telephone Encounter (Signed)
Pt showed up to his 3:30 pm apt at 3:50 pm stating that was the time of the apt. pt was asking if he could still be seen. Front desk informed the patient that he was 20 mins late and Dr Brett Fairy had already went into the sleep lab and she would not be able to see today due to him being late.

## 2017-07-13 ENCOUNTER — Other Ambulatory Visit: Payer: Self-pay | Admitting: Neurology

## 2017-07-13 ENCOUNTER — Telehealth: Payer: Self-pay | Admitting: Neurology

## 2017-07-13 MED ORDER — AMPHETAMINE-DEXTROAMPHETAMINE 10 MG PO TABS: ORAL_TABLET | ORAL | 0 refills | Status: DC

## 2017-07-13 NOTE — Telephone Encounter (Signed)
Medication will be ready for pick up

## 2017-07-13 NOTE — Telephone Encounter (Signed)
Pt calling for refill of amphetamine-dextroamphetamine (ADDERALL) 10 MG tablet

## 2017-07-19 ENCOUNTER — Other Ambulatory Visit: Payer: Self-pay | Admitting: Neurology

## 2017-08-15 ENCOUNTER — Telehealth: Payer: Self-pay | Admitting: Neurology

## 2017-08-15 ENCOUNTER — Other Ambulatory Visit: Payer: Self-pay | Admitting: Neurology

## 2017-08-15 MED ORDER — AMPHETAMINE-DEXTROAMPHETAMINE 10 MG PO TABS: ORAL_TABLET | ORAL | 0 refills | Status: DC

## 2017-08-15 NOTE — Telephone Encounter (Signed)
Will be ready at the front for pick up

## 2017-08-15 NOTE — Telephone Encounter (Signed)
Pt calling for refill of amphetamine-dextroamphetamine (ADDERALL) 10 MG tablet

## 2017-08-22 ENCOUNTER — Encounter: Payer: Self-pay | Admitting: Neurology

## 2017-08-22 ENCOUNTER — Ambulatory Visit (INDEPENDENT_AMBULATORY_CARE_PROVIDER_SITE_OTHER): Payer: BLUE CROSS/BLUE SHIELD | Admitting: Neurology

## 2017-08-22 VITALS — BP 147/91 | HR 64 | Ht 72.0 in | Wt 205.0 lb

## 2017-08-22 DIAGNOSIS — G2581 Restless legs syndrome: Secondary | ICD-10-CM

## 2017-08-22 DIAGNOSIS — G2589 Other specified extrapyramidal and movement disorders: Secondary | ICD-10-CM

## 2017-08-22 DIAGNOSIS — G253 Myoclonus: Secondary | ICD-10-CM

## 2017-08-22 DIAGNOSIS — F901 Attention-deficit hyperactivity disorder, predominantly hyperactive type: Secondary | ICD-10-CM

## 2017-08-22 DIAGNOSIS — G4701 Insomnia due to medical condition: Secondary | ICD-10-CM

## 2017-08-22 MED ORDER — AMPHETAMINE-DEXTROAMPHETAMINE 10 MG PO TABS: ORAL_TABLET | ORAL | 0 refills | Status: DC

## 2017-08-22 NOTE — Telephone Encounter (Signed)
Closing Encounter.

## 2017-08-22 NOTE — Progress Notes (Signed)
S[facility]EP [facility]   Provider:  Larey Seat, M D  Referring Provider: [name], MD Primary Care Physician:  Aaron Agreste, MD  Chief Complaint  Patient presents with  . Follow-up    pt alone, rm 10,     HPI:  Aaron [name] is a 56 y.o. male , seen here as a revisit  from Dr. Carlota [name] , following up on a sleep study from 2014 , diagnosed with RLS, insomnia-  I have the pleasure of seeing Aaron [name] today on 08/22/2017. He had been an enrolled patient and her restless leg trial which finished December 2017. He was given intravenous iron at the time. He felt that he responded beneficial. He now presents with excessive daytime sleepiness, resurgence of restless legs, but also not as bad as it used to be. He does have problems with waking up in the middle of the night not always sure if it's PLMS restless legs or other symptoms. Due to an underlying condition of ADHD he is also treated with stimulants which affect his sleep pattern. There also contribute somewhat to restless legs. He endorsed today the Epworth sleepiness score at 16 points, which is an elevated level. He would like to be further evaluated for the possible gene variant p tyr 362 , a genetic variant of BHL HE 41G. Aaron [name] only light about 10 years ago but his symptoms qualify as restless legs, both of his parents had difficulty sleeping through the night and struggled with insomnia. He has had difficulty sleeping through the night and wakes up between 3 and 5 AM on most days. Gabapentin and iron IV  helped, trazodone helped.       HPI: I have personally last seen Aaron [name] in 2014 after a diagnosed him with restless leg syndrome related insomnia and had explained to him that his apnea was so mild that CPAP intervention was not justified. In the meantime he has followed up for his celiac disease with a specialist at [facility] in [place], Aaron [name].  He has a family history of psoriasis, he was only  diagnosed with celiac disease at age 46. He is unaware of a family history of celiac, Crohn's or any colitis. He is concerned about malabsorption. He used to take po Iron, but stopped after reading " it's bad for older folks" .  His son was diagnosed with IBS, negative celiac testing. He has sleeping problems, too.   Sleep habits are as follows:His usual bedtime is around midnight, he only sleeps about 2-3 hours on block and then wakes up. His restless legs do no longer bother him that much that he couldn't initiate sleep. He does wake up in the middle of the night however with kicking and he is aware of his mild sleep apnea. Usually he tries to distract himself a little bit watches TV or reads until he is able to fall asleep again. He will get another 2 or 3 hours of sleep and usually sleeps better in the morning hours. He uses Gabapentin for RLS, 400 mg qhs.   Sleep medical history and family sleep history: son has insomnia.  Social history: Patient is not a smoker no tobacco use in any form, 2 mixed drinks a day on average, he drinks coffee only in the morning 2-3 cups a day. No soda, tea is caffeine free  Review of Systems: Out of a complete 14 system review, the patient complains of only the following symptoms, and all other  reviewed systems are negative. Hyperactivity, better focus on ritalin.  Did not tolerate adderall.   Epworth score 12 , Fatigue severity score 40  , depression score 2, endorses an irresistible urge to move in the late afternoon and evening hours but more so if he was physically very active in daytime before.  [facility] restless leg syndrome quality of life questionnaire:  the patient endorsed a moderate- mild  impairment over the last 4 month - his restless legs doesn't do not keep him from attending social activities,  but they trouble him more concentrated in the afternoon and the evening and sometimes also affect his ability to get up in the morning.   Social  History   Social History  . Marital status: Married    Spouse name: Aaron [name]  . Number of children: 3  . Years of education: N/A   Occupational History  . Manager    Social History Main Topics  . Smoking status: Never Smoker  . Smokeless tobacco: Never Used  . Alcohol use 4.2 oz/week    7 Standard drinks or equivalent per week     Comment: Occassionally  . Drug use: No  . Sexual activity: Yes   Other Topics Concern  . Not on file   Social History Narrative   Patient lives at home with wife Aaron [name].    Patient has 3 children.    Patient has a BA.     Family History  Problem Relation Age of Onset  . Hypertension Mother   . Diabetes Mother   . Hypertension Father   . Heart disease Maternal Grandmother   . Heart disease Maternal Grandfather   . Heart disease Paternal Grandfather   . Colon cancer Neg Hx   . Colon polyps Neg Hx   . [name] disease Neg Hx   . Esophageal cancer Neg Hx     Past Medical History:  Diagnosis Date  . ADHD   . Celiac disease   . GERD (gastroesophageal reflux disease)   . RLS (restless legs syndrome)   . Sleep apnea syndrome    tested  under [facility],  12-2-- 2004    Past Surgical History:  Procedure Laterality Date  . APPENDECTOMY    . HERNIA REPAIR      Current Outpatient Prescriptions  Medication Sig Dispense Refill  . amphetamine-dextroamphetamine (ADDERALL) 10 MG tablet May take 1-2 tablets by mouth daily. 60 tablet 0  . Doxycycline Hyclate (ACTICLATE) 150 MG TABS Take 150 mg by mouth.    . ferrous sulfate 325 (65 FE) MG tablet Take 325 mg by mouth daily. 2 times weekly    . fish oil-omega-3 fatty acids 1000 MG capsule Take 2 g by mouth daily.    . fluocinonide (LIDEX) 0.05 % external solution     . gabapentin (NEURONTIN) 300 MG capsule Switch from 400 mg to 300 mg nightly dose after day 5 on IV therapy. 30 capsule 11  . gabapentin (NEURONTIN) 400 MG capsule Take 1 capsule (400 mg total) by mouth at bedtime. 8 capsule 0   . sertraline (ZOLOFT) 50 MG tablet Take 1 tablet (50 mg total) by mouth daily. Office visit needed for additional refills. 2nd notice. 90 tablet 1  . traZODone (DESYREL) 50 MG tablet TAKE 1 TABLET(50 MG) BY MOUTH AT BEDTIME AS NEEDED FOR SLEEP 30 tablet 0  . traZODone (DESYREL) 50 MG tablet TAKE 1 TABLET(50 MG) BY MOUTH AT BEDTIME AS NEEDED FOR SLEEP 30 tablet 0  .  triamcinolone cream (KENALOG) 0.1 % Apply 1 application topically 2 (two) times daily. 30 g 0   No current facility-administered medications for this visit.     Allergies as of 08/22/2017  . (No Known Allergies)    Vitals: BP (!) 147/91   Pulse 64   Ht 6' (1.829 m)   Wt 205 lb (93 kg)   BMI 27.80 kg/m  Last Weight:  Wt Readings from Last 1 Encounters:  08/22/17 205 lb (93 kg)   HOZ:YYQM mass index is 27.8 kg/m.     Last Height:   Ht Readings from Last 1 Encounters:  08/22/17 6' (1.829 m)    Physical exam:  General: The patient is awake, alert and appears not in acute distress. The patient is well groomed. Head: Normocephalic, atraumatic. Neck is supple. Mallampati 3 ,  neck circumference: 14.5. Nasal airflow unrestricition , neither TMJ nor Retrognathia is seen.  Cardiovascular:  Regular rate and rhythm without  murmurs or carotid bruit, and without distended neck veins. Respiratory: Lungs are clear to auscultation. Skin:  Without evidence of edema, not evident is bluish discoloration of the feet- was seen a year ago.  Trunk: BMI is 28 The patient's posture is erect.  Neurologic exam : The patient is awake and alert, oriented to place and time.   Memory subjective  described as intact. Attention span & concentration ability appears normal.  Speech is fluent,  without dysarthria, dysphonia or aphasia.   Cranial nerves: Pupils are equal and briskly reactive to light. Visual fields by finger perimetry are intact. Hearing to finger rub intact.  Facial sensation intact to fine touch. Facial motor strength is  symmetric and tongue and uvula move midline. Shoulder shrug was symmetrical.   Motor exam:  Normal tone, muscle bulk and symmetric strength in all extremities. Sensory:  Fine touch, pinprick and vibration were tested in all extremities. Proprioception tested in the upper extremities was normal. Coordination: Rapid alternating movements in the fingers/hands was normal. Finger-to-nose maneuver  normal without evidence of ataxia, dysmetria or tremor. Gait and station: Patient walks without assistive device and is able unassisted to climb up to the exam table. Strength within normal limits. Stance is stable and normal.  Toe and heel stand were tested .Tandem gait is unfragmented. Turns with 3  Steps. Romberg testing is  negative. Deep tendon reflexes: in the  upper and lower extremities are symmetric and intact. Babinski maneuver response is  downgoing.   Assessment:  After physical and neurologic examination, review of laboratory studies,  Personal review of imaging studies, reports of other /same  Imaging studies,  Results of polysomnography/ neurophysiology testing and pre-existing records as far as provided in visit., my assessment is positive for  PLM and RLS, ADHD medication side effects.   1) No neuropathy.  Neurontin helps still with sleep.   2) RLS - irresistible urge to move. Mr. Kuba received iron infusions during a restless leg trial here at [facility] and has felt less impairment by restless legs since the treatment. He is still frequently waking up from the restless legs but it is an improvement in comparison to prior to visit enrollment. Usually once a night.  3) ADHD , adult form, he has continued Adderall 10 Mg  - refills today. Worked better than Ritalin.   4) insomnia, currently treated with trazodone which was well tolerated. Refills today,     Larey Seat MD  08/22/2017   CC: Aaron [name], [address], [place] [address]

## 2017-08-22 NOTE — Addendum Note (Signed)
Addended by: Larey Seat on: 08/22/2017 04:02 PM   Modules accepted: Orders

## 2017-08-22 NOTE — Patient Instructions (Signed)
Amphetamine; Dextroamphetamine tablets What is this medicine? AMPHETAMINE; DEXTROAMPHETAMINE(am FET a meen; dex troe am FET a meen) is used to treat attention-deficit hyperactivity disorder (ADHD). It may also be used for narcolepsy. Federal law prohibits giving this medicine to any person other than the person for whom it was prescribed. Do not share this medicine with anyone else. This medicine may be used for other purposes; ask your health care provider or pharmacist if you have questions. COMMON BRAND NAME(S): Adderall What should I tell my health care provider before I take this medicine? They need to know if you have any of these conditions: -anxiety or panic attacks -circulation problems in fingers and toes -glaucoma -hardening or blockages of the arteries or heart blood vessels -heart disease or a heart defect -high blood pressure -history of a drug or alcohol abuse problem -history of stroke -kidney disease -liver disease -mental illness -seizures -suicidal thoughts, plans, or attempt; a previous suicide attempt by you or a family member -thyroid disease -Tourette's syndrome -an unusual or allergic reaction to dextroamphetamine, other amphetamines, other medicines, foods, dyes, or preservatives -pregnant or trying to get pregnant -breast-feeding How should I use this medicine? Take this medicine by mouth with a glass of water. Follow the directions on the prescription label. Take your doses at regular intervals. Do not take your medicine more often than directed. Do not suddenly stop your medicine. You must gradually reduce the dose or you may feel withdrawal effects. Ask your doctor or health care professional for advice. Talk to your pediatrician regarding the use of this medicine in children. Special care may be needed. While this drug may be prescribed for children as young as 3 years for selected conditions, precautions do apply. Overdosage: If you think you have taken too  much of this medicine contact a poison control center or emergency room at once. NOTE: This medicine is only for you. Do not share this medicine with others. What if I miss a dose? If you miss a dose, take it as soon as you can. If it is almost time for your next dose, take only that dose. Do not take double or extra doses. What may interact with this medicine? Do not take this medicine with any of the following medications: -MAOIS like Carbex, Eldepryl, Marplan, Nardil, and Parnate -other stimulant medicines for attention disorders, weight loss, or to stay awake This medicine may also interact with the following medications: -acetazolamide -ammonium chloride -antacids -ascorbic acid -atomoxetine -caffeine -certain medicines for blood pressure -certain medicines for depression, anxiety, or psychotic disturbances -certain medicines for seizures like carbamazepine, phenobarbital, phenytoin -certain medicines for stomach problems like cimetidine, famotidine, omeprazole, lansoprazole -cold or allergy medicines -glutamic acid -lithium -meperidine -methenamine; sodium acid phosphate -narcotic medicines for pain -norepinephrine -phenothiazines like chlorpromazine, mesoridazine, prochlorperazine, thioridazine -sodium acid phosphate -sodium bicarbonate This list may not describe all possible interactions. Give your health care provider a list of all the medicines, herbs, non-prescription drugs, or dietary supplements you use. Also tell them if you smoke, drink alcohol, or use illegal drugs. Some items may interact with your medicine. What should I watch for while using this medicine? Visit your doctor or health care professional for regular checks on your progress. This prescription requires that you follow special procedures with your doctor and pharmacy. You will need to have a new written prescription from your doctor every time you need a refill. This medicine may affect your  concentration, or hide signs of tiredness. Until you know how this   medicine affects you, do not drive, ride a bicycle, use machinery, or do anything that needs mental alertness. Tell your doctor or health care professional if this medicine loses its effects, or if you feel you need to take more than the prescribed amount. Do not change the dosage without talking to your doctor or health care professional. Decreased appetite is a common side effect when starting this medicine. Eating small, frequent meals or snacks can help. Talk to your doctor if you continue to have poor eating habits. Height and weight growth of a child taking this medicine will be monitored closely. Do not take this medicine close to bedtime. It may prevent you from sleeping. If you are going to need surgery, a MRI, CT scan, or other procedure, tell your doctor that you are taking this medicine. You may need to stop taking this medicine before the procedure. Tell your doctor or healthcare professional right away if you notice unexplained wounds on your fingers and toes while taking this medicine. You should also tell your healthcare provider if you experience numbness or pain, changes in the skin color, or sensitivity to temperature in your fingers or toes. What side effects may I notice from receiving this medicine? Side effects that you should report to your doctor or health care professional as soon as possible: -allergic reactions like skin rash, itching or hives, swelling of the face, lips, or tongue -changes in vision -chest pain or chest tightness -confusion, trouble speaking or understanding -fast, irregular heartbeat -fingers or toes feel numb, cool, painful -hallucination, loss of contact with reality -high blood pressure -males: prolonged or painful erection -seizures -severe headaches -shortness of breath -suicidal thoughts or other mood changes -trouble walking, dizziness, loss of balance or  coordination -uncontrollable head, mouth, neck, arm, or leg movements Side effects that usually do not require medical attention (report to your doctor or health care professional if they continue or are bothersome): -anxious -headache -loss of appetite -nausea, vomiting -trouble sleeping -weight loss This list may not describe all possible side effects. Call your doctor for medical advice about side effects. You may report side effects to FDA at 1-800-FDA-1088. Where should I keep my medicine? Keep out of the reach of children. This medicine can be abused. Keep your medicine in a safe place to protect it from theft. Do not share this medicine with anyone. Selling or giving away this medicine is dangerous and against the law. Store at room temperature between 15 and 30 degrees C (59 and 86 degrees F). Keep container tightly closed. Throw away any unused medicine after the expiration date. Dispose of properly. This medicine may cause accidental overdose and death if it is taken by other adults, children, or pets. Mix any unused medicine with a substance like cat litter or coffee grounds. Then throw the medicine away in a sealed container like a sealed bag or a coffee can with a lid. Do not use the medicine after the expiration date. NOTE: This sheet is a summary. It may not cover all possible information. If you have questions about this medicine, talk to your doctor, pharmacist, or health care provider.  2018 Elsevier/Gold Standard (2014-08-28 18:44:41)  

## 2017-09-12 ENCOUNTER — Other Ambulatory Visit: Payer: Self-pay | Admitting: Neurology

## 2017-09-16 ENCOUNTER — Telehealth: Payer: Self-pay

## 2017-09-16 NOTE — Telephone Encounter (Signed)
Patient is calling to request a refill on his Adderall. Please call when ready. He is aware this will not be done until Monday.

## 2017-09-19 ENCOUNTER — Other Ambulatory Visit: Payer: Self-pay | Admitting: Neurology

## 2017-09-20 ENCOUNTER — Other Ambulatory Visit: Payer: Self-pay | Admitting: Neurology

## 2017-09-20 MED ORDER — AMPHETAMINE-DEXTROAMPHETAMINE 10 MG PO TABS: ORAL_TABLET | ORAL | 0 refills | Status: DC

## 2017-09-20 NOTE — Telephone Encounter (Signed)
Called the patient to make him aware that his script will be ready for pick up today after lunch. Pt verbalized understanding.

## 2017-10-12 ENCOUNTER — Encounter: Payer: Self-pay | Admitting: Family Medicine

## 2017-10-20 ENCOUNTER — Other Ambulatory Visit: Payer: Self-pay | Admitting: Neurology

## 2017-10-20 ENCOUNTER — Telehealth: Payer: Self-pay | Admitting: Neurology

## 2017-10-20 MED ORDER — AMPHETAMINE-DEXTROAMPHETAMINE 10 MG PO TABS: ORAL_TABLET | ORAL | 0 refills | Status: DC

## 2017-10-20 NOTE — Telephone Encounter (Signed)
Script is ready for the patient at the front desk

## 2017-10-20 NOTE — Telephone Encounter (Signed)
Pt request refill for amphetamine-dextroamphetamine (ADDERALL) 10 MG tablet . Pt is aware the clinic closes at noon tomorrow.

## 2017-11-11 ENCOUNTER — Other Ambulatory Visit: Payer: Self-pay | Admitting: Family Medicine

## 2017-11-11 DIAGNOSIS — F329 Major depressive disorder, single episode, unspecified: Secondary | ICD-10-CM

## 2017-11-22 ENCOUNTER — Telehealth: Payer: Self-pay | Admitting: Neurology

## 2017-11-22 NOTE — Telephone Encounter (Signed)
Patient requesting refill of amphetamine-dextroamphetamine (ADDERALL) 10 MG tablet.

## 2017-11-23 MED ORDER — AMPHETAMINE-DEXTROAMPHETAMINE 10 MG PO TABS: ORAL_TABLET | ORAL | 0 refills | Status: DC

## 2017-11-23 NOTE — Addendum Note (Signed)
Addended by: Belinda Block A on: 11/23/2017 02:11 PM   Modules accepted: Orders

## 2017-11-23 NOTE — Telephone Encounter (Signed)
Rx last written on 10/20/17. Rx printed and awaiting [name] sig.

## 2017-12-21 ENCOUNTER — Other Ambulatory Visit: Payer: Self-pay | Admitting: Neurology

## 2017-12-21 MED ORDER — AMPHETAMINE-DEXTROAMPHETAMINE 10 MG PO TABS: ORAL_TABLET | ORAL | 0 refills | Status: DC

## 2017-12-21 NOTE — Telephone Encounter (Signed)
Script will be ready for patient at the front

## 2017-12-21 NOTE — Telephone Encounter (Signed)
Pt requesting refill for amphetamine-dextroamphetamine (ADDERALL) 10 MG tablet.

## 2018-01-09 ENCOUNTER — Other Ambulatory Visit: Payer: Self-pay | Admitting: Family Medicine

## 2018-01-09 DIAGNOSIS — F329 Major depressive disorder, single episode, unspecified: Secondary | ICD-10-CM

## 2018-01-09 NOTE — Telephone Encounter (Signed)
Sertraline refill LOV: 06/03/17 PCP: Merri Ray MD Pharmacy: [facility]

## 2018-02-10 ENCOUNTER — Telehealth: Payer: Self-pay | Admitting: Neurology

## 2018-02-10 NOTE — Telephone Encounter (Signed)
We are not able to process this script until closer to the date it was last filled. He will be due for a refill on 4/11. I will be able to fill it at that time and depending on the on call MD will be whether I can e fax it or print it.

## 2018-02-10 NOTE — Telephone Encounter (Signed)
Pt request refill for amphetamine-dextroamphetamine (ADDERALL) 10 MG tablet sent to [facility]. Pt is aware the clinic closes at noon today. He is not out of the medication yet.

## 2018-02-15 ENCOUNTER — Other Ambulatory Visit: Payer: Self-pay | Admitting: Neurology

## 2018-02-15 MED ORDER — AMPHETAMINE-DEXTROAMPHETAMINE 10 MG PO TABS: ORAL_TABLET | ORAL | 0 refills | Status: DC

## 2018-02-22 ENCOUNTER — Ambulatory Visit: Payer: BLUE CROSS/BLUE SHIELD | Admitting: Neurology

## 2018-03-19 ENCOUNTER — Other Ambulatory Visit: Payer: Self-pay | Admitting: Neurology

## 2018-03-20 ENCOUNTER — Other Ambulatory Visit: Payer: Self-pay | Admitting: Neurology

## 2018-03-21 ENCOUNTER — Telehealth: Payer: Self-pay | Admitting: Neurology

## 2018-03-21 ENCOUNTER — Other Ambulatory Visit: Payer: Self-pay | Admitting: Neurology

## 2018-03-21 MED ORDER — AMPHETAMINE-DEXTROAMPHETAMINE 10 MG PO TABS: ORAL_TABLET | ORAL | 0 refills | Status: DC

## 2018-03-21 NOTE — Telephone Encounter (Signed)
Patient requesting refill of amphetamine-dextroamphetamine (ADDERALL) 10 MG tablet sent to [facility] on [address]. Last time was sent to wrong pharmacy.

## 2018-03-21 NOTE — Telephone Encounter (Signed)
I have routed to Dr Brett Fairy for review and taken the other pharmacy out of the system. The right pharmacy is in now.

## 2018-04-11 ENCOUNTER — Telehealth: Payer: Self-pay | Admitting: Neurology

## 2018-04-20 ENCOUNTER — Other Ambulatory Visit: Payer: Self-pay | Admitting: Neurology

## 2018-04-20 ENCOUNTER — Telehealth: Payer: Self-pay | Admitting: Neurology

## 2018-04-20 MED ORDER — AMPHETAMINE-DEXTROAMPHETAMINE 10 MG PO TABS: ORAL_TABLET | ORAL | 0 refills | Status: DC

## 2018-04-20 NOTE — Telephone Encounter (Signed)
Pt requesting refill for amphetamine-dextroamphetamine (ADDERALL) 10 MG tablet sent to [facility]

## 2018-04-20 NOTE — Telephone Encounter (Signed)
I have routed this request to Dr Brett Fairy for review. The pt is due for the medication and [place] registry was verified.

## 2018-05-17 ENCOUNTER — Telehealth: Payer: Self-pay | Admitting: Neurology

## 2018-05-17 ENCOUNTER — Other Ambulatory Visit: Payer: Self-pay | Admitting: Neurology

## 2018-05-17 MED ORDER — AMPHETAMINE-DEXTROAMPHETAMINE 10 MG PO TABS: ORAL_TABLET | ORAL | 0 refills | Status: DC

## 2018-05-17 NOTE — Telephone Encounter (Signed)
I have routed this request to the work in MD for review. The pt is due for the medication and  registry was verified.

## 2018-05-17 NOTE — Telephone Encounter (Signed)
Patient requesting refill of *amphetamine-dextroamphetamine (ADDERALL) 10 MG tablet sent to [facility] on [address]. I advised [name] is out of the office and will send to her nurse.

## 2018-06-15 ENCOUNTER — Ambulatory Visit: Payer: BLUE CROSS/BLUE SHIELD | Admitting: Neurology

## 2018-06-15 ENCOUNTER — Encounter: Payer: Self-pay | Admitting: Neurology

## 2018-06-15 VITALS — BP 150/97 | HR 67 | Ht 72.0 in | Wt 191.0 lb

## 2018-06-15 DIAGNOSIS — F908 Attention-deficit hyperactivity disorder, other type: Secondary | ICD-10-CM | POA: Diagnosis not present

## 2018-06-15 MED ORDER — AMPHETAMINE-DEXTROAMPHETAMINE 10 MG PO TABS: ORAL_TABLET | ORAL | 0 refills | Status: DC

## 2018-06-15 MED ORDER — TRAZODONE HCL 50 MG PO TABS: 50.0000 mg | ORAL_TABLET | Freq: Every evening | ORAL | 3 refills | Status: DC | PRN

## 2018-06-15 NOTE — Progress Notes (Signed)
SLEEP [facility]INE CL[facility]IC   Provider:  Larey Seat, M D  Referring Provider: [name], MD Primary Care Physician:  [name], MD  Chief Complaint  Patient presents with  . Follow-up    pt states that things are well, wanted to discuss a few things about gabapentine and trazadone medications.     HPI:  Aaron [name] is a 57 y.o. male, seen here as a revisit for RLS- I have the pleasure of meeting with Aaron [name] today in a yearly revisit, he has followed in this office for restless legs, has participated in iron IV trial and has also improved insomnia on trazodone.  He has struggled with insomnia just as have his parents.  Restless legs also seem to be an inherited trait.  Since the IV iron was applied his restless legs have significantly improved but not completely alleviated but his intensity of the urge to move insistence and the consistency has been much reduced.  He endorses the Epworth Sleepiness Scale at 12 out of 24 possible points, last year at this time he endorses at 16 points.  He continues medication for ADHD adult type hyper activity type.    Following up on a sleep study from 2014 , diagnosed with RLS, insomnia- I have the pleasure of seeing Aaron [name] today on 08/22/2017. He had been an enrolled patient and her restless leg trial which finished December 2017. He was given intravenous iron at the time. He felt that he responded beneficial. He now presents with excessive daytime sleepiness, resurgence of restless legs, but also not as bad as it used to be. He does have problems with waking up in the middle of the night not always sure if it's PLMS restless legs or other symptoms. Due to an underlying condition of ADHD he is also treated with stimulants which affect his sleep pattern. There also contribute somewhat to restless legs. He endorsed today the Epworth sleepiness score at 16 points, which is an elevated level. He would like to be further evaluated for  the possible gene variant p tyr 362 , a genetic variant of BHL HE 41G. Aaron [name] only light about 10 years ago but his symptoms qualify as restless legs, both of his parents had difficulty sleeping through the night and struggled with insomnia. He has had difficulty sleeping through the night and wakes up between 3 and 5 AM on most days. Gabapentin and iron IV  helped, trazodone helped.     HPI: I have personally last seen Aaron [name] in 2014 after a diagnosed him with restless leg syndrome related insomnia and had explained to him that his apnea was so mild that CPAP intervention was not justified. In the meantime he has followed up for his celiac disease with a specialist at [facility] in [place], Dr. Marissa Nestle.  He has a family history of psoriasis, he was only diagnosed with celiac disease at age 2. He is unaware of a family history of celiac, Crohn's or any colitis. He is concerned about malabsorption. He used to take po Iron, but stopped after reading " it's bad for older folks" .  His son was diagnosed with IBS, negative celiac testing. He has sleeping problems, too.   Sleep habits are as follows:His usual bedtime is around midnight, he only sleeps about 2-3 hours on block and then wakes up. His restless legs do no longer bother him that much that he couldn't initiate sleep. He does wake up in the middle  of the night however with kicking and he is aware of his mild sleep apnea. Usually he tries to distract himself a little bit watches TV or reads until he is able to fall asleep again. He will get another 2 or 3 hours of sleep and usually sleeps better in the morning hours. He uses Gabapentin for RLS, 400 mg qhs.   Sleep medical history and family sleep history: son has insomnia.  Social history: Patient is not a smoker no tobacco use in any form, 2 mixed drinks a day on average, he drinks coffee only in the morning 2-3 cups a day. No soda, tea is caffeine free  Review of Systems: Out of a  complete 14 system review, the patient complains of only the following symptoms, and all other reviewed systems are negative. Hyperactivity, better focus on ritalin.  Did not tolerate adderall.   Epworth score 12 , Fatigue severity score 40  , depression score 2, endorses an irresistible urge to move in the late afternoon and evening hours but more so if he was physically very active in daytime before.  [facility] restless leg syndrome quality of life questionnaire:  the patient endorsed a moderate- mild  impairment over the last 4 month - his restless legs doesn't do not keep him from attending social activities,  but they trouble him more concentrated in the afternoon and the evening and sometimes also affect his ability to get up in the morning.   Social History   Socioeconomic History  . Marital status: Married    Spouse name: Aaron [name]  . Number of children: 3  . Years of education: Not on file  . Highest education level: Not on file  Occupational History  . Occupation: Freight forwarder  Social Needs  . Financial resource strain: Not on file  . Food insecurity:    Worry: Not on file    Inability: Not on file  . Transportation needs:    Medical: Not on file    Non-medical: Not on file  Tobacco Use  . Smoking status: Never Smoker  . Smokeless tobacco: Never Used  Substance and Sexual Activity  . Alcohol use: Yes    Alcohol/week: 7.0 standard drinks    Types: 7 Standard drinks or equivalent per week    Comment: Occassionally  . Drug use: No  . Sexual activity: Yes  Lifestyle  . Physical activity:    Days per week: Not on file    Minutes per session: Not on file  . Stress: Not on file  Relationships  . Social connections:    Talks on phone: Not on file    Gets together: Not on file    Attends religious service: Not on file    Active member of club or organization: Not on file    Attends meetings of clubs or organizations: Not on file    Relationship status: Not on file  .  Intimate partner violence:    Fear of current or ex partner: Not on file    Emotionally abused: Not on file    Physically abused: Not on file    Forced sexual activity: Not on file  Other Topics Concern  . Not on file  Social History Narrative   Patient lives at home with wife Aaron [name].    Patient has 3 children.    Patient has a BA.     Family History  Problem Relation Age of Onset  . Hypertension Mother   . Diabetes Mother   .  Hypertension Father   . Heart disease Maternal Grandmother   . Heart disease Maternal Grandfather   . Heart disease Paternal Grandfather   . Colon cancer Neg Hx   . Colon polyps Neg Hx   . [name] disease Neg Hx   . Esophageal cancer Neg Hx     Past Medical History:  Diagnosis Date  . ADHD   . Celiac disease   . GERD (gastroesophageal reflux disease)   . RLS (restless legs syndrome)   . Sleep apnea syndrome    tested  under [facility],  12-2-- 2004    Past Surgical History:  Procedure Laterality Date  . APPENDECTOMY    . HERNIA REPAIR      Current Outpatient Medications  Medication Sig Dispense Refill  . amphetamine-dextroamphetamine (ADDERALL) 10 MG tablet May take 1-2 tablets by mouth daily. 60 tablet 0  . ferrous sulfate 325 (65 FE) MG tablet Take 325 mg by mouth daily. 2 times weekly    . fish oil-omega-3 fatty acids 1000 MG capsule Take 2 g by mouth 2 (two) times a week.     . sertraline (ZOLOFT) 50 MG tablet TAKE ONE TABLET BY MOUTH DAILY 90 tablet 1  . sulfamethoxazole-trimethoprim (BACTRIM DS,SEPTRA DS) 800-160 MG tablet TK 1 T PO BID  5  . traZODone (DESYREL) 50 MG tablet TAKE ONE TABLET BY MOUTH AT BEDTIME AS NEEDED FOR SLEEP 30 tablet 4   No current facility-administered medications for this visit.     Allergies as of 06/15/2018  . (No Known Allergies)    Vitals: BP (!) 150/97   Pulse 67   Ht 6' (1.829 m)   Wt 191 lb (86.6 kg)   BMI 25.90 kg/m  Last Weight:  Wt Readings from Last 1 Encounters:  06/15/18  191 lb (86.6 kg)   OVZ:CHYI mass index is 25.9 kg/m.     Last Height:   Ht Readings from Last 1 Encounters:  06/15/18 6' (1.829 m)    Physical exam:  General: The patient is awake, alert and appears not in acute distress. The patient is well groomed. Head: Normocephalic, atraumatic. Neck is supple. Mallampati 3 ,  neck circumference: 14.5. Nasal airflow unrestricition , neither TMJ nor Retrognathia is seen.  Cardiovascular:  Regular rate and rhythm without  murmurs or carotid bruit, and without distended neck veins. Respiratory: Lungs are clear to auscultation. Skin:  Without evidence of edema, not evident is bluish discoloration of the feet- was seen a year ago.  Trunk: BMI is 28 The patient's posture is erect.  Neurologic exam : The patient is awake and alert, oriented to place and time.   Memory subjective  described as intact. Attention span & concentration ability appears normal, but he is hyperactive- restless. Marland Kitchen  Speech is fluent,  without dysarthria, dysphonia or aphasia.   Cranial nerves: Pupils are equal and briskly reactive to light. Facial sensation intact to fine touch. Facial motor strength is symmetric and tongue and uvula move midline. Shoulder shrug was symmetrical.  Motor exam:  Normal tone, muscle bulk and symmetric strength in all extremities. Sensory:  Fine touch, pinprick and vibration were normal. Finger-to-nose maneuver  normal without evidence of ataxia, dysmetria or tremor. Gait and station: Patient walks without assistive device and is able unassisted to climb up to the exam table. Strength within normal limits. Stance is stable and normal. Turns with 3  Steps. Romberg testing is  negative. Deep tendon reflexes: in the  upper and  lower extremities are symmetric and intact.   Assessment:  After physical and neurologic examination, review of laboratory studies,  Personal review of imaging studies, reports of other /same  Imaging studies,  Results of  polysomnography/ neurophysiology testing and pre-existing records as far as provided in visit., my assessment is positive for PLM and RLS, ADHD medication side effects.   1) No neuropathy but Neurontin helps still with sleep.   2) RLS - irresistible urge to move. Aaron [name] received iron infusions during a restless leg trial here at [facility] and has felt less impairment by restless legs since the treatment. He is feeling much improved .   3) ADHD , adult form, he has continued Adderall 10 Mg  - refills today. Worked better than Ritalin. Has not been on Vyvanse.   4) insomnia, currently treated with trazodone, refills   Rv in 12 month with Np or me.   Asencion Partridge Lisabeth Mian MD  06/15/2018   CC: [name], [address] [place], [place] [address]

## 2018-07-17 ENCOUNTER — Telehealth: Payer: Self-pay | Admitting: Neurology

## 2018-07-17 ENCOUNTER — Other Ambulatory Visit: Payer: Self-pay | Admitting: Family Medicine

## 2018-07-17 ENCOUNTER — Other Ambulatory Visit: Payer: Self-pay | Admitting: Neurology

## 2018-07-17 DIAGNOSIS — F329 Major depressive disorder, single episode, unspecified: Secondary | ICD-10-CM

## 2018-07-17 MED ORDER — AMPHETAMINE-DEXTROAMPHETAMINE 10 MG PO TABS: ORAL_TABLET | ORAL | 0 refills | Status: DC

## 2018-07-17 NOTE — Telephone Encounter (Signed)
Patient called, left VM to return call to schedule a physical with Dr. Carlota Raspberry in order to continue to receive medication refills, last OV 06/03/17. Zoloft refilled x 30 days per protocol pending appointment.

## 2018-07-17 NOTE — Telephone Encounter (Signed)
I have routed this request to Dr Brett Fairy for review. The pt is due for the medication and [place] registry was verified.

## 2018-07-17 NOTE — Telephone Encounter (Signed)
Pt requesting refills for amphetamine-dextroamphetamine (ADDERALL) 10 MG tablet sent to [facility]

## 2018-08-16 ENCOUNTER — Other Ambulatory Visit: Payer: Self-pay | Admitting: Neurology

## 2018-08-16 ENCOUNTER — Telehealth: Payer: Self-pay | Admitting: Neurology

## 2018-08-16 MED ORDER — AMPHETAMINE-DEXTROAMPHETAMINE 10 MG PO TABS: ORAL_TABLET | ORAL | 0 refills | Status: DC

## 2018-08-16 NOTE — Telephone Encounter (Signed)
I have routed this request to Dr Brett Fairy for review. The pt is due for the medication and [place] registry was verified.

## 2018-08-16 NOTE — Telephone Encounter (Signed)
Pt requesting refills for amphetamine-dextroamphetamine (ADDERALL) 10 MG tablet sent to [facility]

## 2018-08-25 ENCOUNTER — Telehealth: Payer: Self-pay | Admitting: Family Medicine

## 2018-08-25 DIAGNOSIS — F329 Major depressive disorder, single episode, unspecified: Secondary | ICD-10-CM

## 2018-08-25 NOTE — Telephone Encounter (Signed)
Requested medication (s) are due for refill today:   Yes  Requested medication (s) are on the active medication list:   Yes  Future visit scheduled:   No  Been notified to make an appt for refills.   30 day courtesy supply given in Sept. 2019.  PCP:  Carlota Raspberry   Last ordered: 07/17/18  #30  0 refills   Requested Prescriptions  Pending Prescriptions Disp Refills   sertraline (ZOLOFT) 50 MG tablet [Pharmacy Med Name: SERTRALINE HCL 50 MG TABLET] 30 tablet 0    Sig: TAKE ONE TABLET BY MOUTH DAILY *NEED APPOINTMENT FOR FURTHER REFILLS*     Psychiatry:  Antidepressants - SSRI Failed - 08/25/2018  6:20 AM      Failed - Completed PHQ-2 or PHQ-9 in the last 360 days.      Failed - Valid encounter within last 6 months    Recent Outpatient Visits          1 year ago Annual physical exam   [facility], Ranell Patrick, MD   2 years ago Need for prophylactic vaccination and inoculation against influenza   [facility], Ranell Patrick, MD   2 years ago Depression   [facility], Ranell Patrick, MD   3 years ago Folliculitis   [facility], Fenton Malling, MD   4 years ago RLS (restless legs syndrome)   [facility] Fenton Malling, MD

## 2018-08-28 ENCOUNTER — Other Ambulatory Visit: Payer: Self-pay

## 2018-08-28 DIAGNOSIS — F329 Major depressive disorder, single episode, unspecified: Secondary | ICD-10-CM

## 2018-08-28 MED ORDER — SERTRALINE HCL 50 MG PO TABS: 50.0000 mg | ORAL_TABLET | Freq: Every day | ORAL | 0 refills | Status: DC

## 2018-08-28 NOTE — Telephone Encounter (Signed)
Rx sent to pharmacy. Called and informed pt.

## 2018-08-28 NOTE — Telephone Encounter (Signed)
Patient is requesting a Refill  Of sertraline (ZOLOFT) 50 MG tablet. Patient stated he is completely out the next available appt is 09-20-18. The patient is schedule and is requesting a refill to hold till the schedule appt. He is requesting a call back in regards. Please advise

## 2018-09-05 ENCOUNTER — Encounter: Payer: Self-pay | Admitting: Family Medicine

## 2018-09-20 ENCOUNTER — Ambulatory Visit: Payer: BLUE CROSS/BLUE SHIELD | Admitting: Family Medicine

## 2018-09-20 ENCOUNTER — Encounter: Payer: Self-pay | Admitting: Family Medicine

## 2018-09-20 ENCOUNTER — Other Ambulatory Visit: Payer: Self-pay

## 2018-09-20 VITALS — BP 146/84 | HR 60 | Temp 97.8°F | Resp 18 | Ht 72.0 in | Wt 204.0 lb

## 2018-09-20 DIAGNOSIS — F329 Major depressive disorder, single episode, unspecified: Secondary | ICD-10-CM | POA: Diagnosis not present

## 2018-09-20 DIAGNOSIS — R55 Syncope and collapse: Secondary | ICD-10-CM | POA: Diagnosis not present

## 2018-09-20 DIAGNOSIS — I10 Essential (primary) hypertension: Secondary | ICD-10-CM | POA: Diagnosis not present

## 2018-09-20 DIAGNOSIS — F32A Depression, unspecified: Secondary | ICD-10-CM

## 2018-09-20 DIAGNOSIS — R001 Bradycardia, unspecified: Secondary | ICD-10-CM

## 2018-09-20 DIAGNOSIS — Z789 Other specified health status: Secondary | ICD-10-CM

## 2018-09-20 DIAGNOSIS — Z7289 Other problems related to lifestyle: Secondary | ICD-10-CM

## 2018-09-20 MED ORDER — SERTRALINE HCL 50 MG PO TABS: 50.0000 mg | ORAL_TABLET | Freq: Every day | ORAL | 1 refills | Status: DC

## 2018-09-20 MED ORDER — AMLODIPINE BESYLATE 2.5 MG PO TABS: 2.5000 mg | ORAL_TABLET | Freq: Every day | ORAL | 1 refills | Status: DC

## 2018-09-20 NOTE — Progress Notes (Signed)
[  5:15 PM] I was on my way to another room, and saw patient laying on the floor. Patient was faint after blood drawn; no injuries.   HPI Patient was lightheadedness a few minutes after having his blood drawn. He states he's "just laying down on the ground, as he's done this before after having blood drawn." He did eat lunch today. He denies chest pain or chest tightness.   Physical Exam He's diaphoretic, but able to respond appropriately.   He was placed back in supine position on exam table.   BP 114/67, heart rate 44 bpm, O2Sat 98%  [5:21 PM] EKG: marked sinus bradycardia rate 46; unable to view previous EKG's. Per note of previous EKG, even though I'm unable to read it, he had a rate of 49 on EKG in 2018. He was asymptomatic at that time with bradycardia, but did recommend to recheck in 1 month.   [5:36 PM] Recheck BP: 116/67  I personally performed the services described in this documentation, which was scribed in my presence. The recorded information has been reviewed and considered for accuracy and completeness, addended by me as needed, and agree with information above.  Signed,   Merri Ray, MD [facility].  09/25/18 10:35 PM

## 2018-09-20 NOTE — Patient Instructions (Addendum)
Blood pressure is running in the hypertension stage, and would recommend medication, especially with the significant elevated readings noted at the dentist.    Cut back to 1 alcoholic drink per night. If you are having more difficulty sleeping, please call your sleep specialist. If you have trouble cutting back on alcohol - please let me know.   Avoid high salt/sodium foods as that can also worsen blood pressure.    As we discussed some of the other medications including Adderall may be contributing to some of the elevated blood pressure, so would recommend discussing that with your sleep specialist.  See other info on hypertension below. I will check some blood work.   If blood pressures are running over 140/90 - start amlodipine low-dose once per day.   Please follow-up in the next 3 to 4 weeks to review blood pressure further.  Additionally please schedule a physical in the next few months to review other  health maintenance.  See link for vaccines, but shingles vaccine sent to your pharmacy last year. We can discuss vaccines further at your physical.  [email or web address]  Return to the clinic or go to the nearest emergency room if any of your symptoms worsen or new symptoms occur.    Hypertension Hypertension, commonly called high blood pressure, is when the force of blood pumping through the arteries is too strong. The arteries are the blood vessels that carry blood from the heart throughout the body. Hypertension forces the heart to work harder to pump blood and may cause arteries to become narrow or stiff. Having untreated or uncontrolled hypertension can cause heart attacks, strokes, kidney disease, and other problems. A blood pressure reading consists of a higher number over a lower number. Ideally, your blood pressure should be below 120/80. The first ("top") number is called the systolic pressure. It is a measure of the pressure in your arteries as your  heart beats. The second ("bottom") number is called the diastolic pressure. It is a measure of the pressure in your arteries as the heart relaxes. What are the causes? The cause of this condition is not known. What increases the risk? Some risk factors for high blood pressure are under your control. Others are not. Factors you can change  Smoking.  Having type 2 diabetes mellitus, high cholesterol, or both.  Not getting enough exercise or physical activity.  Being overweight.  Having too much fat, sugar, calories, or salt (sodium) in your diet.  Drinking too much alcohol. Factors that are difficult or impossible to change  Having chronic kidney disease.  Having a family history of high blood pressure.  Age. Risk increases with age.  Race. You may be at higher risk if you are African-American.  Gender. Men are at higher risk than women before age 77. After age 30, women are at higher risk than men.  Having obstructive sleep apnea.  Stress. What are the signs or symptoms? Extremely high blood pressure (hypertensive crisis) may cause:  Headache.  Anxiety.  Shortness of breath.  Nosebleed.  Nausea and vomiting.  Severe chest pain.  Jerky movements you cannot control (seizures).  How is this diagnosed? This condition is diagnosed by measuring your blood pressure while you are seated, with your arm resting on a surface. The cuff of the blood pressure monitor will be placed directly against the skin of your upper arm at the level of your heart. It should be measured at least twice using the same arm. Certain conditions can cause a difference  in blood pressure between your right and left arms. Certain factors can cause blood pressure readings to be lower or higher than normal (elevated) for a short period of time:  When your blood pressure is higher when you are in a health care provider's office than when you are at home, this is called white coat hypertension. Most  people with this condition do not need medicines.  When your blood pressure is higher at home than when you are in a health care provider's office, this is called masked hypertension. Most people with this condition may need medicines to control blood pressure.  If you have a high blood pressure reading during one visit or you have normal blood pressure with other risk factors:  You may be asked to return on a different day to have your blood pressure checked again.  You may be asked to monitor your blood pressure at home for 1 week or longer.  If you are diagnosed with hypertension, you may have other blood or imaging tests to help your health care provider understand your overall risk for other conditions. How is this treated? This condition is treated by making healthy lifestyle changes, such as eating healthy foods, exercising more, and reducing your alcohol intake. Your health care provider may prescribe medicine if lifestyle changes are not enough to get your blood pressure under control, and if:  Your systolic blood pressure is above 130.  Your diastolic blood pressure is above 80.  Your personal target blood pressure may vary depending on your medical conditions, your age, and other factors. Follow these instructions at home: Eating and drinking  Eat a diet that is high in fiber and potassium, and low in sodium, added sugar, and fat. An example eating plan is called the DASH (Dietary Approaches to Stop Hypertension) diet. To eat this way: ? Eat plenty of fresh fruits and vegetables. Try to fill half of your plate at each meal with fruits and vegetables. ? Eat whole grains, such as whole wheat pasta, brown rice, or whole grain bread. Fill about one quarter of your plate with whole grains. ? Eat or drink low-fat dairy products, such as skim milk or low-fat yogurt. ? Avoid fatty cuts of meat, processed or cured meats, and poultry with skin. Fill about one quarter of your plate with  lean proteins, such as fish, chicken without skin, beans, eggs, and tofu. ? Avoid premade and processed foods. These tend to be higher in sodium, added sugar, and fat.  Reduce your daily sodium intake. Most people with hypertension should eat less than 1,500 mg of sodium a day.  Limit alcohol intake to no more than 1 drink a day for nonpregnant women and 2 drinks a day for men. One drink equals 12 oz of beer, 5 oz of wine, or 1 oz of hard liquor. Lifestyle  Work with your health care provider to maintain a healthy body weight or to lose weight. Ask what an ideal weight is for you.  Get at least 30 minutes of exercise that causes your heart to beat faster (aerobic exercise) most days of the week. Activities may include walking, swimming, or biking.  Include exercise to strengthen your muscles (resistance exercise), such as pilates or lifting weights, as part of your weekly exercise routine. Try to do these types of exercises for 30 minutes at least 3 days a week.  Do not use any products that contain nicotine or tobacco, such as cigarettes and e-cigarettes. If you need help  quitting, ask your health care provider.  Monitor your blood pressure at home as told by your health care provider.  Keep all follow-up visits as told by your health care provider. This is important. Medicines  Take over-the-counter and prescription medicines only as told by your health care provider. Follow directions carefully. Blood pressure medicines must be taken as prescribed.  Do not skip doses of blood pressure medicine. Doing this puts you at risk for problems and can make the medicine less effective.  Ask your health care provider about side effects or reactions to medicines that you should watch for. Contact a health care provider if:  You think you are having a reaction to a medicine you are taking.  You have headaches that keep coming back (recurring).  You feel dizzy.  You have swelling in your  ankles.  You have trouble with your vision. Get help right away if:  You develop a severe headache or confusion.  You have unusual weakness or numbness.  You feel faint.  You have severe pain in your chest or abdomen.  You vomit repeatedly.  You have trouble breathing. Summary  Hypertension is when the force of blood pumping through your arteries is too strong. If this condition is not controlled, it may put you at risk for serious complications.  Your personal target blood pressure may vary depending on your medical conditions, your age, and other factors. For most people, a normal blood pressure is less than 120/80.  Hypertension is treated with lifestyle changes, medicines, or a combination of both. Lifestyle changes include weight loss, eating a healthy, low-sodium diet, exercising more, and limiting alcohol. This information is not intended to replace advice given to you by your health care provider. Make sure you discuss any questions you have with your health care provider. Document Released: 10/25/2005 Document Revised: 09/22/2016 Document Reviewed: 09/22/2016 Elsevier Interactive Patient Education  2018 [employer].   How to Take Your Blood Pressure You can take your blood pressure at home with a machine. You may need to check your blood pressure at home:  To check if you have high blood pressure (hypertension).  To check your blood pressure over time.  To make sure your blood pressure medicine is working.  Supplies needed: You will need a blood pressure machine, or monitor. You can buy one at a drugstore or online. When choosing one:  Choose one with an arm cuff.  Choose one that wraps around your upper arm. Only one finger should fit between your arm and the cuff.  Do not choose one that measures your blood pressure from your wrist or finger.  Your doctor can suggest a monitor. How to prepare Avoid these things for 30 minutes before checking your blood  pressure:  Drinking caffeine.  Drinking alcohol.  Eating.  Smoking.  Exercising.  Five minutes before checking your blood pressure:  Pee.  Sit in a dining chair. Avoid sitting in a soft couch or armchair.  Be quiet. Do not talk.  How to take your blood pressure Follow the instructions that came with your machine. If you have a digital blood pressure monitor, these may be the instructions: 1. Sit up straight. 2. Place your feet on the floor. Do not cross your ankles or legs. 3. Rest your left arm at the level of your heart. You may rest it on a table, desk, or chair. 4. Pull up your shirt sleeve. 5. Wrap the blood pressure cuff around the upper part of your  left arm. The cuff should be 1 inch (2.5 cm) above your elbow. It is best to wrap the cuff around bare skin. 6. Fit the cuff snugly around your arm. You should be able to place only one finger between the cuff and your arm. 7. Put the cord inside the groove of your elbow. 8. Press the power button. 9. Sit quietly while the cuff fills with air and loses air. 10. Write down the numbers on the screen. 11. Wait 2-3 minutes and then repeat steps 1-10.  What do the numbers mean? Two numbers make up your blood pressure. The first number is called systolic pressure. The second is called diastolic pressure. An example of a blood pressure reading is "120 over 80" (or 120/80). If you are an adult and do not have a medical condition, use this guide to find out if your blood pressure is normal: Normal  First number: below 120.  Second number: below 80. Elevated  First number: 120-129.  Second number: below 80. Hypertension stage 1  First number: 130-139.  Second number: 80-89. Hypertension stage 2  First number: 140 or above.  Second number: 15 or above. Your blood pressure is above normal even if only the top or bottom number is above normal. Follow these instructions at home:  Check your blood pressure as often as  your doctor tells you to.  Take your monitor to your next doctor's appointment. Your doctor will: ? Make sure you are using it correctly. ? Make sure it is working right.  Make sure you understand what your blood pressure numbers should be.  Tell your doctor if your medicines are causing side effects. Contact a doctor if:  Your blood pressure keeps being high. Get help right away if:  Your first blood pressure number is higher than 180.  Your second blood pressure number is higher than 120. This information is not intended to replace advice given to you by your health care provider. Make sure you discuss any questions you have with your health care provider. Document Released: 10/07/2008 Document Revised: 09/22/2016 Document Reviewed: 04/02/2016 Elsevier Interactive Patient Education  [employer].   If you have lab work done today you will be contacted with your lab results within the next 2 weeks.  If you have not heard from [place] then please contact us. The fastest way to get your results is to register for My Chart.   IF you received an x-ray today, you will receive an invoice from [facility]. Please contact [facility] at [phone] with questions or concerns regarding your invoice.   IF you received labwork today, you will receive an invoice from [place]. Please contact [facility] at [phone] with questions or concerns regarding your invoice.   Our billing staff will not be able to assist you with questions regarding bills from these companies.  You will be contacted with the lab results as soon as they are available. The fastest way to get your results is to activate your My Chart account. Instructions are located on the last page of this paperwork. If you have not heard from [place] regarding the results in 2 weeks, please contact this office.

## 2018-09-20 NOTE — Progress Notes (Signed)
 Subjective:    Patient ID: Aaron [name], male    DOB: 03/09/1961, 57 y.o.   MRN: [id number]  HPI Aaron [name] is a 57 y.o. male Presents today for: Chief Complaint  Patient presents with  . Medication Refill    sertaline   Depression: He was last seen in July 2018 for physical.  He was taking Zoloft 50 mg daily at that time with trazodone at night.  Had met with a divorce support group and counselor.  Was noticing some weight gain with the medications.  PHQ of 19 at that time without suicidal thoughts, although he did note that overall symptoms have been fairly stable and wanted to remain at same dose of Zoloft at that time.  He was under the care of a sleep specialist at that time with new referral placed.  Feels like Zoloft is working well at current dose, no new side effects. Weight has increased, exercising most of the week.  Does drink alcohol - 2 per day.  Some sweetened beverages.   Immunization review: Sister is paramedic - recommend he check into vaccines: pna, hep A, measles (had prior vaccination, nit foreign travel planned), chicken pox (had disease as child), shingles vaccine (shingrix sent to pharmacy last year).   Blood pressure: BP Readings from Last 3 Encounters:  09/20/18 (!) 152/92  06/15/18 (!) 150/97  08/22/17 (!) 147/91  when discussed at physical last year, planned on recheck in 1 month.  Home BP's - around 150/90 at [facility] when taking adderall and zoloft.   2 alcoholic drinks per night. Denies DUI, denies alcohol addiction. Denies need to cut back Some agitation if asked about drinking Denies guilty feelings with alcohol. Denies eye opener.  Wt Readings from Last 3 Encounters:  09/20/18 204 lb (92.5 kg)  06/15/18 191 lb (86.6 kg)  08/22/17 205 lb (93 kg)  On adderall for ADHD and trazodone for sleep.  Does not want to start  BP meds.  Has form from [facility] - BP 170/120 on eval on 09/05/18 - would not do treatment. Taken 2 adderall that day. Has not  taken adderall since last week d/t concern about blood pressure. Did not take zoloft last night either.   Immunization History  Administered Date(s) Administered  . Influenza Whole 08/02/2013  . Influenza,inj,Quad PF,6+ Mos 12/11/2014, 08/06/2016  . Influenza-Unspecified 09/05/2018  . Pneumococcal Polysaccharide-23 12/11/2014  . Td 11/08/2006  . Tdap 06/03/2017       Wt Readings from Last 3 Encounters:  09/20/18 204 lb (92.5 kg)  06/15/18 191 lb (86.6 kg)  08/22/17 205 lb (93 kg)     Depression screen PHQ 2/9 09/20/2018 06/03/2017 08/06/2016 05/03/2016 08/28/2015  Decreased Interest 0 1 0 1 0  Down, Depressed, Hopeless 0 3 0 2 0  PHQ - 2 Score 0 4 0 3 0  Altered sleeping - 3 - 0 -  Tired, decreased energy - 3 - 3 -  Change in appetite - 0 - 3 -  Feeling bad or failure about yourself  - 3 - 3 -  Trouble concentrating - 3 - 3 -  Moving slowly or fidgety/restless - 3 - 0 -  Suicidal thoughts - 0 - 0 -  PHQ-9 Score - 19 - 15 -  Difficult doing work/chores - Very difficult - - -       Patient Active Problem List   Diagnosis Date Noted  . ADHD   . Insomnia 01/09/2015  . Sleep apnea syndrome   .   RLS (restless legs syndrome)   . GERD 08/19/2009  . GASTRITIS 08/19/2009  . CELIAC SPRUE 07/23/2009  . DIARRHEA 07/02/2009  . INSOMNIA-SLEEP DISORDER-UNSPEC 06/30/2009  . ABDOMINAL PAIN OTHER SPECIFIED SITE 06/30/2009  . DEPRESSION 06/05/2008  . OBSTRUCTIVE SLEEP APNEA 06/05/2008  . RESTLESS LEGS SYNDROME 06/05/2008   Past Medical History:  Diagnosis Date  . ADHD   . Celiac disease   . GERD (gastroesophageal reflux disease)   . RLS (restless legs syndrome)   . Sleep apnea syndrome    tested  under [facility],  12-2-- 2004   Past Surgical History:  Procedure Laterality Date  . APPENDECTOMY    . HERNIA REPAIR     No Known Allergies Prior to Admission medications   Medication Sig Start Date End Date Taking? Authorizing Provider    amphetamine-dextroamphetamine (ADDERALL) 10 MG tablet May take 1-2 tablets by mouth daily. 08/16/18  Yes Dohmeier, Carmen, MD  ferrous sulfate 325 (65 FE) MG tablet Take 325 mg by mouth daily. 2 times weekly   Yes [provider]  fish oil-omega-3 fatty acids 1000 MG capsule Take 2 g by mouth 2 (two) times a week.    Yes [provider]  sertraline (ZOLOFT) 50 MG tablet Take 1 tablet (50 mg total) by mouth daily. 08/28/18  Yes Greene, Jeffrey R, MD  sulfamethoxazole-trimethoprim (BACTRIM DS,SEPTRA DS) 800-160 MG tablet TK 1 T PO BID 06/06/18  Yes [provider]  traZODone (DESYREL) 50 MG tablet Take 1 tablet (50 mg total) by mouth at bedtime as needed. for sleep 06/15/18  Yes Dohmeier, Carmen, MD   Social History   Socioeconomic History  . Marital status: Married    Spouse name: Mary  . Number of children: 3  . Years of education: Not on file  . Highest education level: Not on file  Occupational History  . Occupation: Manager  Social Needs  . Financial resource strain: Not on file  . Food insecurity:    Worry: Not on file    Inability: Not on file  . Transportation needs:    Medical: Not on file    Non-medical: Not on file  Tobacco Use  . Smoking status: Never Smoker  . Smokeless tobacco: Never Used  Substance and Sexual Activity  . Alcohol use: Yes    Alcohol/week: 7.0 standard drinks    Types: 7 Standard drinks or equivalent per week    Comment: Occassionally  . Drug use: No  . Sexual activity: Yes  Lifestyle  . Physical activity:    Days per week: Not on file    Minutes per session: Not on file  . Stress: Not on file  Relationships  . Social connections:    Talks on phone: Not on file    Gets together: Not on file    Attends religious service: Not on file    Active member of club or organization: Not on file    Attends meetings of clubs or organizations: Not on file    Relationship status: Not on file  . Intimate partner violence:    Fear  of current or ex partner: Not on file    Emotionally abused: Not on file    Physically abused: Not on file    Forced sexual activity: Not on file  Other Topics Concern  . Not on file  Social History Narrative   Patient lives at home with wife Mary.    Patient has 3 children.    Patient   has a BA.     Review of Systems  Constitutional: Negative for fatigue and unexpected weight change.  Eyes: Negative for visual disturbance.  Respiratory: Negative for cough, chest tightness and shortness of breath.   Cardiovascular: Negative for chest pain, palpitations and leg swelling.  Gastrointestinal: Negative for abdominal pain and blood in stool.  Neurological: Negative for dizziness, light-headedness and headaches.      Objective:   Physical Exam  Constitutional: He is oriented to person, place, and time. He appears well-developed and well-nourished.  HENT:  Head: Normocephalic and atraumatic.  Eyes: Pupils are equal, round, and reactive to light. EOM are normal.  Neck: No JVD present. Carotid bruit is not present.  Cardiovascular: Regular rhythm and normal heart sounds.  No murmur heard. Pulmonary/Chest: Effort normal and breath sounds normal. He has no rales.  Musculoskeletal: He exhibits no edema.  Neurological: He is alert and oriented to person, place, and time.  Skin: Skin is warm and dry.  Psychiatric: He has a normal mood and affect.  Vitals reviewed.  Vitals:   09/20/18 1751 09/20/18 1752  BP: 116/67 (!) 146/84  Pulse:    Resp:    Temp:    SpO2:         Assessment & Plan:    Aaron [name] is a 57 y.o. male Depression, unspecified depression type Reactive depression - Plan: sertraline (ZOLOFT) 50 MG tablet Alcohol use   - refilled zoloft same dose. Overall stable, but did recommend cutting back some on alcohol as may also impact blood pressure  - if difficulty cutting back on alcohol d/t difficulty sleeping, call sleep specialist, or follow up to discuss other  options.   Essential hypertension - Plan: amLODipine (NORVASC) 2.5 MG tablet, Basic metabolic panel, TSH, EKG 12-Lead  - multiple elevated readings and still some elevation in office with holding stimulant and SSRi.  - monitoe outside readings, then tat norvasc low dose initially if elevated.   Near syncope - Plan: EKG 12-Lead Bradycardia  - vagal episode after blood draw without secondary injury. Recovered with time in office. See separate note by medical scribe.   Meds ordered this encounter  Medications  . amLODipine (NORVASC) 2.5 MG tablet    Sig: Take 1 tablet (2.5 mg total) by mouth daily.    Dispense:  90 tablet    Refill:  1  . sertraline (ZOLOFT) 50 MG tablet    Sig: Take 1 tablet (50 mg total) by mouth daily.    Dispense:  90 tablet    Refill:  1   Patient Instructions   Blood pressure is running in the hypertension stage, and would recommend medication, especially with the significant elevated readings noted at the dentist.    Cut back to 1 alcoholic drink per night. If you are having more difficulty sleeping, please call your sleep specialist. If you have trouble cutting back on alcohol - please let me know.   Avoid high salt/sodium foods as that can also worsen blood pressure.    As we discussed some of the other medications including Adderall may be contributing to some of the elevated blood pressure, so would recommend discussing that with your sleep specialist.  See other info on hypertension below. I will check some blood work.   If blood pressures are running over 140/90 - start amlodipine low-dose once per day.   Please follow-up in the next 3 to 4 weeks to review blood pressure further.  Additionally please schedule a physical in   the next few months to review other  health maintenance.  See link for vaccines, but shingles vaccine sent to your pharmacy last year. We can discuss vaccines further at your physical.   [email or web address]  Return to the clinic or go to the nearest emergency room if any of your symptoms worsen or new symptoms occur.    Hypertension Hypertension, commonly called high blood pressure, is when the force of blood pumping through the arteries is too strong. The arteries are the blood vessels that carry blood from the heart throughout the body. Hypertension forces the heart to work harder to pump blood and may cause arteries to become narrow or stiff. Having untreated or uncontrolled hypertension can cause heart attacks, strokes, kidney disease, and other problems. A blood pressure reading consists of a higher number over a lower number. Ideally, your blood pressure should be below 120/80. The first ("top") number is called the systolic pressure. It is a measure of the pressure in your arteries as your heart beats. The second ("bottom") number is called the diastolic pressure. It is a measure of the pressure in your arteries as the heart relaxes. What are the causes? The cause of this condition is not known. What increases the risk? Some risk factors for high blood pressure are under your control. Others are not. Factors you can change  Smoking.  Having type 2 diabetes mellitus, high cholesterol, or both.  Not getting enough exercise or physical activity.  Being overweight.  Having too much fat, sugar, calories, or salt (sodium) in your diet.  Drinking too much alcohol. Factors that are difficult or impossible to change  Having chronic kidney disease.  Having a family history of high blood pressure.  Age. Risk increases with age.  Race. You may be at higher risk if you are African-American.  Gender. Men are at higher risk than women before age 75. After age 65, women are at higher risk than men.  Having obstructive sleep apnea.  Stress. What are the signs or symptoms? Extremely high blood pressure (hypertensive crisis) may  cause:  Headache.  Anxiety.  Shortness of breath.  Nosebleed.  Nausea and vomiting.  Severe chest pain.  Jerky movements you cannot control (seizures).  How is this diagnosed? This condition is diagnosed by measuring your blood pressure while you are seated, with your arm resting on a surface. The cuff of the blood pressure monitor will be placed directly against the skin of your upper arm at the level of your heart. It should be measured at least twice using the same arm. Certain conditions can cause a difference in blood pressure between your right and left arms. Certain factors can cause blood pressure readings to be lower or higher than normal (elevated) for a short period of time:  When your blood pressure is higher when you are in a health care provider's office than when you are at home, this is called white coat hypertension. Most people with this condition do not need medicines.  When your blood pressure is higher at home than when you are in a health care provider's office, this is called masked hypertension. Most people with this condition may need medicines to control blood pressure.  If you have a high blood pressure reading during one visit or you have normal blood pressure with other risk factors:  You may be asked to return on a different day to have your blood pressure checked again.  You may be asked to monitor your blood pressure at  home for 1 week or longer.  If you are diagnosed with hypertension, you may have other blood or imaging tests to help your health care provider understand your overall risk for other conditions. How is this treated? This condition is treated by making healthy lifestyle changes, such as eating healthy foods, exercising more, and reducing your alcohol intake. Your health care provider may prescribe medicine if lifestyle changes are not enough to get your blood pressure under control, and if:  Your systolic blood pressure is above  130.  Your diastolic blood pressure is above 80.  Your personal target blood pressure may vary depending on your medical conditions, your age, and other factors. Follow these instructions at home: Eating and drinking  Eat a diet that is high in fiber and potassium, and low in sodium, added sugar, and fat. An example eating plan is called the DASH (Dietary Approaches to Stop Hypertension) diet. To eat this way: ? Eat plenty of fresh fruits and vegetables. Try to fill half of your plate at each meal with fruits and vegetables. ? Eat whole grains, such as whole wheat pasta, brown rice, or whole grain bread. Fill about one quarter of your plate with whole grains. ? Eat or drink low-fat dairy products, such as skim milk or low-fat yogurt. ? Avoid fatty cuts of meat, processed or cured meats, and poultry with skin. Fill about one quarter of your plate with lean proteins, such as fish, chicken without skin, beans, eggs, and tofu. ? Avoid premade and processed foods. These tend to be higher in sodium, added sugar, and fat.  Reduce your daily sodium intake. Most people with hypertension should eat less than 1,500 mg of sodium a day.  Limit alcohol intake to no more than 1 drink a day for nonpregnant women and 2 drinks a day for men. One drink equals 12 oz of beer, 5 oz of wine, or 1 oz of hard liquor. Lifestyle  Work with your health care provider to maintain a healthy body weight or to lose weight. Ask what an ideal weight is for you.  Get at least 30 minutes of exercise that causes your heart to beat faster (aerobic exercise) most days of the week. Activities may include walking, swimming, or biking.  Include exercise to strengthen your muscles (resistance exercise), such as pilates or lifting weights, as part of your weekly exercise routine. Try to do these types of exercises for 30 minutes at least 3 days a week.  Do not use any products that contain nicotine or tobacco, such as cigarettes and  e-cigarettes. If you need help quitting, ask your health care provider.  Monitor your blood pressure at home as told by your health care provider.  Keep all follow-up visits as told by your health care provider. This is important. Medicines  Take over-the-counter and prescription medicines only as told by your health care provider. Follow directions carefully. Blood pressure medicines must be taken as prescribed.  Do not skip doses of blood pressure medicine. Doing this puts you at risk for problems and can make the medicine less effective.  Ask your health care provider about side effects or reactions to medicines that you should watch for. Contact a health care provider if:  You think you are having a reaction to a medicine you are taking.  You have headaches that keep coming back (recurring).  You feel dizzy.  You have swelling in your ankles.  You have trouble with your vision. Get help right away  if:  You develop a severe headache or confusion.  You have unusual weakness or numbness.  You feel faint.  You have severe pain in your chest or abdomen.  You vomit repeatedly.  You have trouble breathing. Summary  Hypertension is when the force of blood pumping through your arteries is too strong. If this condition is not controlled, it may put you at risk for serious complications.  Your personal target blood pressure may vary depending on your medical conditions, your age, and other factors. For most people, a normal blood pressure is less than 120/80.  Hypertension is treated with lifestyle changes, medicines, or a combination of both. Lifestyle changes include weight loss, eating a healthy, low-sodium diet, exercising more, and limiting alcohol. This information is not intended to replace advice given to you by your health care provider. Make sure you discuss any questions you have with your health care provider. Document Released: 10/25/2005 Document Revised: 09/22/2016  Document Reviewed: 09/22/2016 Elsevier Interactive Patient Education  2018 Elsevier Inc.   How to Take Your Blood Pressure You can take your blood pressure at home with a machine. You may need to check your blood pressure at home:  To check if you have high blood pressure (hypertension).  To check your blood pressure over time.  To make sure your blood pressure medicine is working.  Supplies needed: You will need a blood pressure machine, or monitor. You can buy one at a drugstore or online. When choosing one:  Choose one with an arm cuff.  Choose one that wraps around your upper arm. Only one finger should fit between your arm and the cuff.  Do not choose one that measures your blood pressure from your wrist or finger.  Your doctor can suggest a monitor. How to prepare Avoid these things for 30 minutes before checking your blood pressure:  Drinking caffeine.  Drinking alcohol.  Eating.  Smoking.  Exercising.  Five minutes before checking your blood pressure:  Pee.  Sit in a dining chair. Avoid sitting in a soft couch or armchair.  Be quiet. Do not talk.  How to take your blood pressure Follow the instructions that came with your machine. If you have a digital blood pressure monitor, these may be the instructions: 1. Sit up straight. 2. Place your feet on the floor. Do not cross your ankles or legs. 3. Rest your left arm at the level of your heart. You may rest it on a table, desk, or chair. 4. Pull up your shirt sleeve. 5. Wrap the blood pressure cuff around the upper part of your left arm. The cuff should be 1 inch (2.5 cm) above your elbow. It is best to wrap the cuff around bare skin. 6. Fit the cuff snugly around your arm. You should be able to place only one finger between the cuff and your arm. 7. Put the cord inside the groove of your elbow. 8. Press the power button. 9. Sit quietly while the cuff fills with air and loses air. 10. Write down the numbers  on the screen. 11. Wait 2-3 minutes and then repeat steps 1-10.  What do the numbers mean? Two numbers make up your blood pressure. The first number is called systolic pressure. The second is called diastolic pressure. An example of a blood pressure reading is "120 over 80" (or 120/80). If you are an adult and do not have a medical condition, use this guide to find out if your blood pressure is normal: Normal    First number: below 120.  Second number: below 80. Elevated  First number: 120-129.  Second number: below 80. Hypertension stage 1  First number: 130-139.  Second number: 80-89. Hypertension stage 2  First number: 140 or above.  Second number: 90 or above. Your blood pressure is above normal even if only the top or bottom number is above normal. Follow these instructions at home:  Check your blood pressure as often as your doctor tells you to.  Take your monitor to your next doctor's appointment. Your doctor will: ? Make sure you are using it correctly. ? Make sure it is working right.  Make sure you understand what your blood pressure numbers should be.  Tell your doctor if your medicines are causing side effects. Contact a doctor if:  Your blood pressure keeps being high. Get help right away if:  Your first blood pressure number is higher than 180.  Your second blood pressure number is higher than 120. This information is not intended to replace advice given to you by your health care provider. Make sure you discuss any questions you have with your health care provider. Document Released: 10/07/2008 Document Revised: 09/22/2016 Document Reviewed: 04/02/2016 Elsevier Interactive Patient Education  2018 Elsevier Inc.   If you have lab work done today you will be contacted with your lab results within the next 2 weeks.  If you have not heard from us then please contact us. The fastest way to get your results is to register for My Chart.   IF you received an  x-ray today, you will receive an invoice from [facility]. Please contact [facility] at [phone] with questions or concerns regarding your invoice.   IF you received labwork today, you will receive an invoice from [facility]. Please contact [facility] at [phone] with questions or concerns regarding your invoice.   Our billing staff will not be able to assist you with questions regarding bills from these companies.  You will be contacted with the lab results as soon as they are available. The fastest way to get your results is to activate your My Chart account. Instructions are located on the last page of this paperwork. If you have not heard from us regarding the results in 2 weeks, please contact this office.      I personally performed the services described in this documentation, which portions were scribed in my presence.  Primary note authored by me. The recorded information has been reviewed and considered for accuracy and completeness, addended by me as needed, and agree with information above.  Signed,   Jeffrey Greene, MD [facility].  09/25/18 10:25 PM   

## 2018-09-21 LAB — BASIC METABOLIC PANEL
BUN/Creatinine Ratio: 12 (ref 9–20)
BUN: 16 mg/dL (ref 6–24)
CO2: 24 mmol/L (ref 20–29)
Calcium: 9.3 mg/dL (ref 8.7–10.2)
Chloride: 99 mmol/L (ref 96–106)
Creatinine, Ser: 1.29 mg/dL — ABNORMAL HIGH (ref 0.76–1.27)
GFR calc Af Amer: 70 mL/min/{1.73_m2} (ref >59)
GFR calc non Af Amer: 60 mL/min/{1.73_m2} (ref >59)
Glucose: 87 mg/dL (ref 65–99)
Potassium: 4.5 mmol/L (ref 3.5–5.2)
Sodium: 140 mmol/L (ref 134–144)

## 2018-09-21 LAB — TSH: TSH: 3.09 u[IU]/mL (ref 0.450–4.500)

## 2018-09-25 ENCOUNTER — Encounter: Payer: Self-pay | Admitting: Family Medicine

## 2018-09-25 ENCOUNTER — Telehealth: Payer: Self-pay | Admitting: Neurology

## 2018-09-25 NOTE — Telephone Encounter (Signed)
Patient requesting refill of amphetamine-dextroamphetamine (ADDERALL) 10 MG tablet sent to [facility] on [address].

## 2018-09-26 ENCOUNTER — Other Ambulatory Visit: Payer: Self-pay | Admitting: Neurology

## 2018-09-26 ENCOUNTER — Telehealth: Payer: Self-pay | Admitting: Family Medicine

## 2018-09-26 MED ORDER — AMPHETAMINE-DEXTROAMPHETAMINE 10 MG PO TABS: ORAL_TABLET | ORAL | 0 refills | Status: DC

## 2018-09-26 NOTE — Telephone Encounter (Signed)
LVM for pt to call the office and reschedule their appt. Due to schedule changes, their provider will not be available. When pt calls back, please reschedule at their convenience.    Thank you!

## 2018-09-26 NOTE — Telephone Encounter (Signed)
I have routed this request to Dr Brett Fairy for review. The pt is due for the medication and [place] registry was verified.

## 2018-10-18 ENCOUNTER — Ambulatory Visit: Payer: BLUE CROSS/BLUE SHIELD | Admitting: Family Medicine

## 2018-10-19 ENCOUNTER — Other Ambulatory Visit: Payer: Self-pay | Admitting: Neurology

## 2018-10-19 ENCOUNTER — Telehealth: Payer: Self-pay | Admitting: Neurology

## 2018-10-19 MED ORDER — AMPHETAMINE-DEXTROAMPHETAMINE 10 MG PO TABS: ORAL_TABLET | ORAL | 0 refills | Status: DC

## 2018-10-19 NOTE — Telephone Encounter (Signed)
I have routed this request to Dr Brett Fairy for review. The pt is due for the medication and [place] registry was verified.

## 2018-10-19 NOTE — Telephone Encounter (Signed)
Pt has called for a refill on his amphetamine-dextroamphetamine (ADDERALL) 10 MG tablet [facility] [phone]

## 2018-10-24 ENCOUNTER — Ambulatory Visit: Payer: BLUE CROSS/BLUE SHIELD | Admitting: Family Medicine

## 2018-10-24 ENCOUNTER — Encounter: Payer: Self-pay | Admitting: Family Medicine

## 2018-10-24 ENCOUNTER — Other Ambulatory Visit: Payer: Self-pay

## 2018-10-24 VITALS — BP 142/90 | HR 62 | Temp 98.2°F | Ht 72.0 in | Wt 203.8 lb

## 2018-10-24 DIAGNOSIS — I10 Essential (primary) hypertension: Secondary | ICD-10-CM

## 2018-10-24 MED ORDER — AMLODIPINE BESYLATE 5 MG PO TABS: 5.0000 mg | ORAL_TABLET | Freq: Every day | ORAL | 1 refills | Status: DC

## 2018-10-24 NOTE — Patient Instructions (Addendum)
Try to cut back on salt in the diet or sodium containing foods such as frozen foods and restaurant foods as much as possible as those often have high levels of sodium.  See some recommendations below on DASH diet.  Increase amlodipine to 5 mg/day, and follow-up with me in 4 to 6 weeks.  We can recheck the kidney function at that time but as I discussed today it was only borderline and not concerning at that level at all.  Other electrolytes looked good.  Thank you for coming in today.    Hypertension Hypertension, commonly called high blood pressure, is when the force of blood pumping through the arteries is too strong. The arteries are the blood vessels that carry blood from the heart throughout the body. Hypertension forces the heart to work harder to pump blood and may cause arteries to become narrow or stiff. Having untreated or uncontrolled hypertension can cause heart attacks, strokes, kidney disease, and other problems. A blood pressure reading consists of a higher number over a lower number. Ideally, your blood pressure should be below 120/80. The first ("top") number is called the systolic pressure. It is a measure of the pressure in your arteries as your heart beats. The second ("bottom") number is called the diastolic pressure. It is a measure of the pressure in your arteries as the heart relaxes. What are the causes? The cause of this condition is not known. What increases the risk? Some risk factors for high blood pressure are under your control. Others are not. Factors you can change  Smoking.  Having type 2 diabetes mellitus, high cholesterol, or both.  Not getting enough exercise or physical activity.  Being overweight.  Having too much fat, sugar, calories, or salt (sodium) in your diet.  Drinking too much alcohol. Factors that are difficult or impossible to change  Having chronic kidney disease.  Having a family history of high blood pressure.  Age. Risk increases  with age.  Race. You may be at higher risk if you are African-American.  Gender. Men are at higher risk than women before age 71. After age 39, women are at higher risk than men.  Having obstructive sleep apnea.  Stress. What are the signs or symptoms? Extremely high blood pressure (hypertensive crisis) may cause:  Headache.  Anxiety.  Shortness of breath.  Nosebleed.  Nausea and vomiting.  Severe chest pain.  Jerky movements you cannot control (seizures).  How is this diagnosed? This condition is diagnosed by measuring your blood pressure while you are seated, with your arm resting on a surface. The cuff of the blood pressure monitor will be placed directly against the skin of your upper arm at the level of your heart. It should be measured at least twice using the same arm. Certain conditions can cause a difference in blood pressure between your right and left arms. Certain factors can cause blood pressure readings to be lower or higher than normal (elevated) for a short period of time:  When your blood pressure is higher when you are in a health care provider's office than when you are at home, this is called white coat hypertension. Most people with this condition do not need medicines.  When your blood pressure is higher at home than when you are in a health care provider's office, this is called masked hypertension. Most people with this condition may need medicines to control blood pressure.  If you have a high blood pressure reading during one visit or you  have normal blood pressure with other risk factors:  You may be asked to return on a different day to have your blood pressure checked again.  You may be asked to monitor your blood pressure at home for 1 week or longer.  If you are diagnosed with hypertension, you may have other blood or imaging tests to help your health care provider understand your overall risk for other conditions. How is this treated? This  condition is treated by making healthy lifestyle changes, such as eating healthy foods, exercising more, and reducing your alcohol intake. Your health care provider may prescribe medicine if lifestyle changes are not enough to get your blood pressure under control, and if:  Your systolic blood pressure is above 130.  Your diastolic blood pressure is above 80.  Your personal target blood pressure may vary depending on your medical conditions, your age, and other factors. Follow these instructions at home: Eating and drinking  Eat a diet that is high in fiber and potassium, and low in sodium, added sugar, and fat. An example eating plan is called the DASH (Dietary Approaches to Stop Hypertension) diet. To eat this way: ? Eat plenty of fresh fruits and vegetables. Try to fill half of your plate at each meal with fruits and vegetables. ? Eat whole grains, such as whole wheat pasta, brown rice, or whole grain bread. Fill about one quarter of your plate with whole grains. ? Eat or drink low-fat dairy products, such as skim milk or low-fat yogurt. ? Avoid fatty cuts of meat, processed or cured meats, and poultry with skin. Fill about one quarter of your plate with lean proteins, such as fish, chicken without skin, beans, eggs, and tofu. ? Avoid premade and processed foods. These tend to be higher in sodium, added sugar, and fat.  Reduce your daily sodium intake. Most people with hypertension should eat less than 1,500 mg of sodium a day.  Limit alcohol intake to no more than 1 drink a day for nonpregnant women and 2 drinks a day for men. One drink equals 12 oz of beer, 5 oz of wine, or 1 oz of hard liquor. Lifestyle  Work with your health care provider to maintain a healthy body weight or to lose weight. Ask what an ideal weight is for you.  Get at least 30 minutes of exercise that causes your heart to beat faster (aerobic exercise) most days of the week. Activities may include walking, swimming,  or biking.  Include exercise to strengthen your muscles (resistance exercise), such as pilates or lifting weights, as part of your weekly exercise routine. Try to do these types of exercises for 30 minutes at least 3 days a week.  Do not use any products that contain nicotine or tobacco, such as cigarettes and e-cigarettes. If you need help quitting, ask your health care provider.  Monitor your blood pressure at home as told by your health care provider.  Keep all follow-up visits as told by your health care provider. This is important. Medicines  Take over-the-counter and prescription medicines only as told by your health care provider. Follow directions carefully. Blood pressure medicines must be taken as prescribed.  Do not skip doses of blood pressure medicine. Doing this puts you at risk for problems and can make the medicine less effective.  Ask your health care provider about side effects or reactions to medicines that you should watch for. Contact a health care provider if:  You think you are having a reaction  to a medicine you are taking.  You have headaches that keep coming back (recurring).  You feel dizzy.  You have swelling in your ankles.  You have trouble with your vision. Get help right away if:  You develop a severe headache or confusion.  You have unusual weakness or numbness.  You feel faint.  You have severe pain in your chest or abdomen.  You vomit repeatedly.  You have trouble breathing. Summary  Hypertension is when the force of blood pumping through your arteries is too strong. If this condition is not controlled, it may put you at risk for serious complications.  Your personal target blood pressure may vary depending on your medical conditions, your age, and other factors. For most people, a normal blood pressure is less than 120/80.  Hypertension is treated with lifestyle changes, medicines, or a combination of both. Lifestyle changes include  weight loss, eating a healthy, low-sodium diet, exercising more, and limiting alcohol. This information is not intended to replace advice given to you by your health care provider. Make sure you discuss any questions you have with your health care provider. Document Released: 10/25/2005 Document Revised: 09/22/2016 Document Reviewed: 09/22/2016 Elsevier Interactive Patient Education  2018 [place] Eating Plan DASH stands for "Dietary Approaches to Stop Hypertension." The DASH eating plan is a healthy eating plan that has been shown to reduce high blood pressure (hypertension). It may also reduce your risk for type 2 diabetes, heart disease, and stroke. The DASH eating plan may also help with weight loss. What are tips for following this plan? General guidelines  Avoid eating more than 2,300 mg (milligrams) of salt (sodium) a day. If you have hypertension, you may need to reduce your sodium intake to 1,500 mg a day.  Limit alcohol intake to no more than 1 drink a day for nonpregnant women and 2 drinks a day for men. One drink equals 12 oz of beer, 5 oz of wine, or 1 oz of hard liquor.  Work with your health care provider to maintain a healthy body weight or to lose weight. Ask what an ideal weight is for you.  Get at least 30 minutes of exercise that causes your heart to beat faster (aerobic exercise) most days of the week. Activities may include walking, swimming, or biking.  Work with your health care provider or diet and nutrition specialist (dietitian) to adjust your eating plan to your individual calorie needs. Reading food labels  Check food labels for the amount of sodium per serving. Choose foods with less than 5 percent of the Daily Value of sodium. Generally, foods with less than 300 mg of sodium per serving fit into this eating plan.  To find whole grains, look for the word "whole" as the first word in the ingredient list. Shopping  Buy products labeled as  "low-sodium" or "no salt added."  Buy fresh foods. Avoid canned foods and premade or frozen meals. Cooking  Avoid adding salt when cooking. Use salt-free seasonings or herbs instead of table salt or sea salt. Check with your health care provider or pharmacist before using salt substitutes.  Do not fry foods. Cook foods using healthy methods such as baking, boiling, grilling, and broiling instead.  Cook with heart-healthy oils, such as olive, canola, soybean, or sunflower oil. Meal planning   Eat a balanced diet that includes: ? 5 or more servings of fruits and vegetables each day. At each meal, try to fill half of your plate with  fruits and vegetables. ? Up to 6-8 servings of whole grains each day. ? Less than 6 oz of lean meat, poultry, or fish each day. A 3-oz serving of meat is about the same size as a deck of cards. One egg equals 1 oz. ? 2 servings of low-fat dairy each day. ? A serving of nuts, seeds, or beans 5 times each week. ? Heart-healthy fats. Healthy fats called Omega-3 fatty acids are found in foods such as flaxseeds and coldwater fish, like sardines, salmon, and mackerel.  Limit how much you eat of the following: ? Canned or prepackaged foods. ? Food that is high in trans fat, such as fried foods. ? Food that is high in saturated fat, such as fatty meat. ? Sweets, desserts, sugary drinks, and other foods with added sugar. ? Full-fat dairy products.  Do not salt foods before eating.  Try to eat at least 2 vegetarian meals each week.  Eat more home-cooked food and less restaurant, buffet, and fast food.  When eating at a restaurant, ask that your food be prepared with less salt or no salt, if possible. What foods are recommended? The items listed may not be a complete list. Talk with your dietitian about what dietary choices are best for you. Grains Whole-grain or whole-wheat bread. Whole-grain or whole-wheat pasta. Brown rice. [place]. Bulgur. Whole-grain  and low-sodium cereals. Pita bread. Low-fat, low-sodium crackers. Whole-wheat flour tortillas. Vegetables Fresh or frozen vegetables (raw, steamed, roasted, or grilled). Low-sodium or reduced-sodium tomato and vegetable juice. Low-sodium or reduced-sodium tomato sauce and tomato paste. Low-sodium or reduced-sodium canned vegetables. Fruits All fresh, dried, or frozen fruit. Canned fruit in natural juice (without added sugar). Meat and other protein foods Skinless chicken or [place]. Ground chicken or [place]. Pork with fat trimmed off. Fish and seafood. Egg whites. Dried beans, peas, or lentils. Unsalted nuts, nut butters, and seeds. Unsalted canned beans. Lean cuts of beef with fat trimmed off. Low-sodium, lean deli meat. Dairy Low-fat (1%) or fat-free (skim) milk. Fat-free, low-fat, or reduced-fat cheeses. Nonfat, low-sodium ricotta or cottage cheese. Low-fat or nonfat yogurt. Low-fat, low-sodium cheese. Fats and oils Soft margarine without trans fats. Vegetable oil. Low-fat, reduced-fat, or light mayonnaise and salad dressings (reduced-sodium). Canola, safflower, olive, soybean, and sunflower oils. Avocado. Seasoning and other foods Herbs. Spices. Seasoning mixes without salt. Unsalted popcorn and pretzels. Fat-free sweets. What foods are not recommended? The items listed may not be a complete list. Talk with your dietitian about what dietary choices are best for you. Grains Baked goods made with fat, such as croissants, muffins, or some breads. Dry pasta or rice meal packs. Vegetables Creamed or fried vegetables. Vegetables in a cheese sauce. Regular canned vegetables (not low-sodium or reduced-sodium). Regular canned tomato sauce and paste (not low-sodium or reduced-sodium). Regular tomato and vegetable juice (not low-sodium or reduced-sodium). Angie Fava. Olives. Fruits Canned fruit in a light or heavy syrup. Fried fruit. Fruit in cream or butter sauce. Meat and other protein foods Fatty cuts  of meat. Ribs. Fried meat. Berniece Salines. Sausage. Bologna and other processed lunch meats. Salami. Fatback. Hotdogs. Bratwurst. Salted nuts and seeds. Canned beans with added salt. Canned or smoked fish. Whole eggs or egg yolks. Chicken or [place] with skin. Dairy Whole or 2% milk, cream, and half-and-half. Whole or full-fat cream cheese. Whole-fat or sweetened yogurt. Full-fat cheese. Nondairy creamers. Whipped toppings. Processed cheese and cheese spreads. Fats and oils Butter. Stick margarine. Lard. Shortening. Ghee. Bacon fat. Tropical oils, such as coconut, palm  kernel, or palm oil. Seasoning and other foods Salted popcorn and pretzels. Onion salt, garlic salt, seasoned salt, table salt, and sea salt. Worcestershire sauce. Tartar sauce. Barbecue sauce. Teriyaki sauce. Soy sauce, including reduced-sodium. Steak sauce. Canned and packaged gravies. Fish sauce. Oyster sauce. Cocktail sauce. Horseradish that you find on the shelf. Ketchup. Mustard. Meat flavorings and tenderizers. Bouillon cubes. Hot sauce and Tabasco sauce. Premade or packaged marinades. Premade or packaged taco seasonings. Relishes. Regular salad dressings. Where to find more information:  National Heart, Lung, and [place]: https://wilson-eaton.com/  American Heart Association: www.heart.org Summary  The DASH eating plan is a healthy eating plan that has been shown to reduce high blood pressure (hypertension). It may also reduce your risk for type 2 diabetes, heart disease, and stroke.  With the DASH eating plan, you should limit salt (sodium) intake to 2,300 mg a day. If you have hypertension, you may need to reduce your sodium intake to 1,500 mg a day.  When on the DASH eating plan, aim to eat more fresh fruits and vegetables, whole grains, lean proteins, low-fat dairy, and heart-healthy fats.  Work with your health care provider or diet and nutrition specialist (dietitian) to adjust your eating plan to your individual calorie  needs. This information is not intended to replace advice given to you by your health care provider. Make sure you discuss any questions you have with your health care provider. Document Released: 10/14/2011 Document Revised: 10/18/2016 Document Reviewed: 10/18/2016 Elsevier Interactive Patient Education  [employer].   If you have lab work done today you will be contacted with your lab results within the next 2 weeks.  If you have not heard from [place] then please contact us. The fastest way to get your results is to register for My Chart.   IF you received an x-ray today, you will receive an invoice from [facility]. Please contact [facility] at [phone] with questions or concerns regarding your invoice.   IF you received labwork today, you will receive an invoice from [place]. Please contact [facility] at [phone] with questions or concerns regarding your invoice.   Our billing staff will not be able to assist you with questions regarding bills from these companies.  You will be contacted with the lab results as soon as they are available. The fastest way to get your results is to activate your My Chart account. Instructions are located on the last page of this paperwork. If you have not heard from [place] regarding the results in 2 weeks, please contact this office.

## 2018-10-24 NOTE — Progress Notes (Signed)
Subjective:    Patient ID: Aaron [name], male    DOB: Nov 26, 1960, 57 y.o.   MRN: [id number]  HPI Aaron [name] is a 57 y.o. male Presents today for: Chief Complaint  Patient presents with  . Hypertension    f/u    . Hypertension: BP Readings from Last 3 Encounters:  10/24/18 (!) 142/90  09/20/18 (!) 146/84  06/15/18 (!) 150/97   Lab Results  Component Value Date   CREATININE 1.29 (H) 09/20/2018  home readings - at [facility].  145-150/90-95.  Cut back on alcohol - now on 1 per day. Not much change in salt intake. Taking amlodipine once per day in evening past few weeks.  Now back on adderall.  No new symptoms.    Patient Active Problem List   Diagnosis Date Noted  . ADHD   . Insomnia 01/09/2015  . Sleep apnea syndrome   . RLS (restless legs syndrome)   . GERD 08/19/2009  . GASTRITIS 08/19/2009  . CELIAC SPRUE 07/23/2009  . DIARRHEA 07/02/2009  . INSOMNIA-SLEEP DISORDER-UNSPEC 06/30/2009  . ABDOMINAL PAIN OTHER SPECIFIED SITE 06/30/2009  . DEPRESSION 06/05/2008  . OBSTRUCTIVE SLEEP APNEA 06/05/2008  . RESTLESS LEGS SYNDROME 06/05/2008   Past Medical History:  Diagnosis Date  . ADHD   . Celiac disease   . GERD (gastroesophageal reflux disease)   . RLS (restless legs syndrome)   . Sleep apnea syndrome    tested  under [place] [facility],  12-2-- 2004   Past Surgical History:  Procedure Laterality Date  . APPENDECTOMY    . HERNIA REPAIR     Allergies  Allergen Reactions  . Gluten Meal Other (See Comments)   Prior to Admission medications   Medication Sig Start Date End Date Taking? Authorizing Provider  amLODipine (NORVASC) 2.5 MG tablet Take 1 tablet (2.5 mg total) by mouth daily. 09/20/18  Yes [name], MD  amphetamine-dextroamphetamine (ADDERALL) 10 MG tablet May take 1-2 tablets by mouth daily. 10/19/18  Yes Dohmeier, Asencion Partridge, MD  ferrous sulfate 325 (65 FE) MG tablet Take 325 mg by mouth daily. 2 times weekly   Yes [provider]  fish oil-omega-3 fatty acids 1000 MG capsule Take 2 g by mouth 2 (two) times a week.    Yes [provider]  sertraline (ZOLOFT) 50 MG tablet Take 1 tablet (50 mg total) by mouth daily. 09/20/18  Yes [name], MD  sulfamethoxazole-trimethoprim (BACTRIM DS,SEPTRA DS) 800-160 MG tablet TK 1 T PO BID 06/06/18  Yes [provider]  traZODone (DESYREL) 50 MG tablet Take 1 tablet (50 mg total) by mouth at bedtime as needed. for sleep 06/15/18  Yes Dohmeier, Asencion Partridge, MD   Social History   Socioeconomic History  . Marital status: Married    Spouse name: Mary  . Number of children: 3  . Years of education: Not on file  . Highest education level: Not on file  Occupational History  . Occupation: Freight forwarder  Social Needs  . Financial resource strain: Not on file  . Food insecurity:    Worry: Not on file    Inability: Not on file  . Transportation needs:    Medical: Not on file    Non-medical: Not on file  Tobacco Use  . Smoking status: Never Smoker  . Smokeless tobacco: Never Used  Substance and Sexual Activity  . Alcohol use: Yes    Alcohol/week: 7.0 standard drinks    Types: 7 Standard drinks or  equivalent per week    Comment: Occassionally  . Drug use: No  . Sexual activity: Yes  Lifestyle  . Physical activity:    Days per week: Not on file    Minutes per session: Not on file  . Stress: Not on file  Relationships  . Social connections:    Talks on phone: Not on file    Gets together: Not on file    Attends religious service: Not on file    Active member of club or organization: Not on file    Attends meetings of clubs or organizations: Not on file    Relationship status: Not on file  . Intimate partner violence:    Fear of current or ex partner: Not on file    Emotionally abused: Not on file    Physically abused: Not on file    Forced sexual activity: Not on file  Other Topics Concern  . Not on file  Social History Narrative   Patient  lives at home with wife [name].    Patient has 3 children.    Patient has a BA.     Review of Systems  Constitutional: Negative for fatigue and unexpected weight change.  Eyes: Negative for visual disturbance.  Respiratory: Negative for cough, chest tightness and shortness of breath.   Cardiovascular: Negative for chest pain, palpitations and leg swelling.  Gastrointestinal: Negative for abdominal pain and blood in stool.  Neurological: Negative for dizziness, light-headedness and headaches.       Objective:   Physical Exam Vitals signs reviewed.  Constitutional:      Appearance: He is well-developed.  HENT:     Head: Normocephalic and atraumatic.  Eyes:     Pupils: Pupils are equal, round, and reactive to light.  Neck:     Vascular: No carotid bruit or JVD.  Cardiovascular:     Rate and Rhythm: Normal rate and regular rhythm.     Heart sounds: Normal heart sounds. No murmur.  Pulmonary:     Effort: Pulmonary effort is normal.     Breath sounds: Normal breath sounds. No rales.  Skin:    General: Skin is warm and dry.  Neurological:     Mental Status: He is alert and oriented to person, place, and time.    Vitals:   10/24/18 1536 10/24/18 1538  BP: (!) 161/95 (!) 142/90  Pulse: 62   Temp: 98.2 F (36.8 C)   TempSrc: Oral   SpO2: 96%   Weight: 203 lb 12.8 oz (92.4 kg)   Height: 6' (1.829 m)         Assessment & Plan:    Aaron [name] is a 57 y.o. male Essential hypertension - Plan: amLODipine (NORVASC) 5 MG tablet  -Salt avoidance discussed with handout on DASH diet.  Increase amlodipine for improved control, recheck 4 to 6 weeks for repeat eval and labs  Meds ordered this encounter  Medications  . amLODipine (NORVASC) 5 MG tablet    Sig: Take 1 tablet (5 mg total) by mouth daily.    Dispense:  90 tablet    Refill:  1   Patient Instructions   Try to cut back on salt in the diet or sodium containing foods such as frozen foods and restaurant foods as  much as possible as those often have high levels of sodium.  See some recommendations below on DASH diet.  Increase amlodipine to 5 mg/day, and follow-up with me in 4 to 6 weeks.  We  can recheck the kidney function at that time but as I discussed today it was only borderline and not concerning at that level at all.  Other electrolytes looked good.  Thank you for coming in today.    Hypertension Hypertension, commonly called high blood pressure, is when the force of blood pumping through the arteries is too strong. The arteries are the blood vessels that carry blood from the heart throughout the body. Hypertension forces the heart to work harder to pump blood and may cause arteries to become narrow or stiff. Having untreated or uncontrolled hypertension can cause heart attacks, strokes, kidney disease, and other problems. A blood pressure reading consists of a higher number over a lower number. Ideally, your blood pressure should be below 120/80. The first ("top") number is called the systolic pressure. It is a measure of the pressure in your arteries as your heart beats. The second ("bottom") number is called the diastolic pressure. It is a measure of the pressure in your arteries as the heart relaxes. What are the causes? The cause of this condition is not known. What increases the risk? Some risk factors for high blood pressure are under your control. Others are not. Factors you can change  Smoking.  Having type 2 diabetes mellitus, high cholesterol, or both.  Not getting enough exercise or physical activity.  Being overweight.  Having too much fat, sugar, calories, or salt (sodium) in your diet.  Drinking too much alcohol. Factors that are difficult or impossible to change  Having chronic kidney disease.  Having a family history of high blood pressure.  Age. Risk increases with age.  Race. You may be at higher risk if you are African-American.  Gender. Men are at higher risk  than women before age 36. After age 64, women are at higher risk than men.  Having obstructive sleep apnea.  Stress. What are the signs or symptoms? Extremely high blood pressure (hypertensive crisis) may cause:  Headache.  Anxiety.  Shortness of breath.  Nosebleed.  Nausea and vomiting.  Severe chest pain.  Jerky movements you cannot control (seizures).  How is this diagnosed? This condition is diagnosed by measuring your blood pressure while you are seated, with your arm resting on a surface. The cuff of the blood pressure monitor will be placed directly against the skin of your upper arm at the level of your heart. It should be measured at least twice using the same arm. Certain conditions can cause a difference in blood pressure between your right and left arms. Certain factors can cause blood pressure readings to be lower or higher than normal (elevated) for a short period of time:  When your blood pressure is higher when you are in a health care provider's office than when you are at home, this is called white coat hypertension. Most people with this condition do not need medicines.  When your blood pressure is higher at home than when you are in a health care provider's office, this is called masked hypertension. Most people with this condition may need medicines to control blood pressure.  If you have a high blood pressure reading during one visit or you have normal blood pressure with other risk factors:  You may be asked to return on a different day to have your blood pressure checked again.  You may be asked to monitor your blood pressure at home for 1 week or longer.  If you are diagnosed with hypertension, you may have other blood or imaging  tests to help your health care provider understand your overall risk for other conditions. How is this treated? This condition is treated by making healthy lifestyle changes, such as eating healthy foods, exercising more, and  reducing your alcohol intake. Your health care provider may prescribe medicine if lifestyle changes are not enough to get your blood pressure under control, and if:  Your systolic blood pressure is above 130.  Your diastolic blood pressure is above 80.  Your personal target blood pressure may vary depending on your medical conditions, your age, and other factors. Follow these instructions at home: Eating and drinking  Eat a diet that is high in fiber and potassium, and low in sodium, added sugar, and fat. An example eating plan is called the DASH (Dietary Approaches to Stop Hypertension) diet. To eat this way: ? Eat plenty of fresh fruits and vegetables. Try to fill half of your plate at each meal with fruits and vegetables. ? Eat whole grains, such as whole wheat pasta, brown rice, or whole grain bread. Fill about one quarter of your plate with whole grains. ? Eat or drink low-fat dairy products, such as skim milk or low-fat yogurt. ? Avoid fatty cuts of meat, processed or cured meats, and poultry with skin. Fill about one quarter of your plate with lean proteins, such as fish, chicken without skin, beans, eggs, and tofu. ? Avoid premade and processed foods. These tend to be higher in sodium, added sugar, and fat.  Reduce your daily sodium intake. Most people with hypertension should eat less than 1,500 mg of sodium a day.  Limit alcohol intake to no more than 1 drink a day for nonpregnant women and 2 drinks a day for men. One drink equals 12 oz of beer, 5 oz of wine, or 1 oz of hard liquor. Lifestyle  Work with your health care provider to maintain a healthy body weight or to lose weight. Ask what an ideal weight is for you.  Get at least 30 minutes of exercise that causes your heart to beat faster (aerobic exercise) most days of the week. Activities may include walking, swimming, or biking.  Include exercise to strengthen your muscles (resistance exercise), such as pilates or lifting  weights, as part of your weekly exercise routine. Try to do these types of exercises for 30 minutes at least 3 days a week.  Do not use any products that contain nicotine or tobacco, such as cigarettes and e-cigarettes. If you need help quitting, ask your health care provider.  Monitor your blood pressure at home as told by your health care provider.  Keep all follow-up visits as told by your health care provider. This is important. Medicines  Take over-the-counter and prescription medicines only as told by your health care provider. Follow directions carefully. Blood pressure medicines must be taken as prescribed.  Do not skip doses of blood pressure medicine. Doing this puts you at risk for problems and can make the medicine less effective.  Ask your health care provider about side effects or reactions to medicines that you should watch for. Contact a health care provider if:  You think you are having a reaction to a medicine you are taking.  You have headaches that keep coming back (recurring).  You feel dizzy.  You have swelling in your ankles.  You have trouble with your vision. Get help right away if:  You develop a severe headache or confusion.  You have unusual weakness or numbness.  You feel faint.  You have severe pain in your chest or abdomen.  You vomit repeatedly.  You have trouble breathing. Summary  Hypertension is when the force of blood pumping through your arteries is too strong. If this condition is not controlled, it may put you at risk for serious complications.  Your personal target blood pressure may vary depending on your medical conditions, your age, and other factors. For most people, a normal blood pressure is less than 120/80.  Hypertension is treated with lifestyle changes, medicines, or a combination of both. Lifestyle changes include weight loss, eating a healthy, low-sodium diet, exercising more, and limiting alcohol. This information is  not intended to replace advice given to you by your health care provider. Make sure you discuss any questions you have with your health care provider. Document Released: 10/25/2005 Document Revised: 09/22/2016 Document Reviewed: 09/22/2016 Elsevier Interactive Patient Education  2018 [place] Eating Plan DASH stands for "Dietary Approaches to Stop Hypertension." The DASH eating plan is a healthy eating plan that has been shown to reduce high blood pressure (hypertension). It may also reduce your risk for type 2 diabetes, heart disease, and stroke. The DASH eating plan may also help with weight loss. What are tips for following this plan? General guidelines  Avoid eating more than 2,300 mg (milligrams) of salt (sodium) a day. If you have hypertension, you may need to reduce your sodium intake to 1,500 mg a day.  Limit alcohol intake to no more than 1 drink a day for nonpregnant women and 2 drinks a day for men. One drink equals 12 oz of beer, 5 oz of wine, or 1 oz of hard liquor.  Work with your health care provider to maintain a healthy body weight or to lose weight. Ask what an ideal weight is for you.  Get at least 30 minutes of exercise that causes your heart to beat faster (aerobic exercise) most days of the week. Activities may include walking, swimming, or biking.  Work with your health care provider or diet and nutrition specialist (dietitian) to adjust your eating plan to your individual calorie needs. Reading food labels  Check food labels for the amount of sodium per serving. Choose foods with less than 5 percent of the Daily Value of sodium. Generally, foods with less than 300 mg of sodium per serving fit into this eating plan.  To find whole grains, look for the word "whole" as the first word in the ingredient list. Shopping  Buy products labeled as "low-sodium" or "no salt added."  Buy fresh foods. Avoid canned foods and premade or frozen meals. Cooking  Avoid  adding salt when cooking. Use salt-free seasonings or herbs instead of table salt or sea salt. Check with your health care provider or pharmacist before using salt substitutes.  Do not fry foods. Cook foods using healthy methods such as baking, boiling, grilling, and broiling instead.  Cook with heart-healthy oils, such as olive, canola, soybean, or sunflower oil. Meal planning   Eat a balanced diet that includes: ? 5 or more servings of fruits and vegetables each day. At each meal, try to fill half of your plate with fruits and vegetables. ? Up to 6-8 servings of whole grains each day. ? Less than 6 oz of lean meat, poultry, or fish each day. A 3-oz serving of meat is about the same size as a deck of cards. One egg equals 1 oz. ? 2 servings of low-fat dairy each day. ? A serving of  nuts, seeds, or beans 5 times each week. ? Heart-healthy fats. Healthy fats called Omega-3 fatty acids are found in foods such as flaxseeds and coldwater fish, like sardines, salmon, and mackerel.  Limit how much you eat of the following: ? Canned or prepackaged foods. ? Food that is high in trans fat, such as fried foods. ? Food that is high in saturated fat, such as fatty meat. ? Sweets, desserts, sugary drinks, and other foods with added sugar. ? Full-fat dairy products.  Do not salt foods before eating.  Try to eat at least 2 vegetarian meals each week.  Eat more home-cooked food and less restaurant, buffet, and fast food.  When eating at a restaurant, ask that your food be prepared with less salt or no salt, if possible. What foods are recommended? The items listed may not be a complete list. Talk with your dietitian about what dietary choices are best for you. Grains Whole-grain or whole-wheat bread. Whole-grain or whole-wheat pasta. Brown rice. [place]. Bulgur. Whole-grain and low-sodium cereals. Pita bread. Low-fat, low-sodium crackers. Whole-wheat flour tortillas. Vegetables Fresh or  frozen vegetables (raw, steamed, roasted, or grilled). Low-sodium or reduced-sodium tomato and vegetable juice. Low-sodium or reduced-sodium tomato sauce and tomato paste. Low-sodium or reduced-sodium canned vegetables. Fruits All fresh, dried, or frozen fruit. Canned fruit in natural juice (without added sugar). Meat and other protein foods Skinless chicken or [place]. Ground chicken or [place]. Pork with fat trimmed off. Fish and seafood. Egg whites. Dried beans, peas, or lentils. Unsalted nuts, nut butters, and seeds. Unsalted canned beans. Lean cuts of beef with fat trimmed off. Low-sodium, lean deli meat. Dairy Low-fat (1%) or fat-free (skim) milk. Fat-free, low-fat, or reduced-fat cheeses. Nonfat, low-sodium ricotta or cottage cheese. Low-fat or nonfat yogurt. Low-fat, low-sodium cheese. Fats and oils Soft margarine without trans fats. Vegetable oil. Low-fat, reduced-fat, or light mayonnaise and salad dressings (reduced-sodium). Canola, safflower, olive, soybean, and sunflower oils. Avocado. Seasoning and other foods Herbs. Spices. Seasoning mixes without salt. Unsalted popcorn and pretzels. Fat-free sweets. What foods are not recommended? The items listed may not be a complete list. Talk with your dietitian about what dietary choices are best for you. Grains Baked goods made with fat, such as croissants, muffins, or some breads. Dry pasta or rice meal packs. Vegetables Creamed or fried vegetables. Vegetables in a cheese sauce. Regular canned vegetables (not low-sodium or reduced-sodium). Regular canned tomato sauce and paste (not low-sodium or reduced-sodium). Regular tomato and vegetable juice (not low-sodium or reduced-sodium). Angie Fava. Olives. Fruits Canned fruit in a light or heavy syrup. Fried fruit. Fruit in cream or butter sauce. Meat and other protein foods Fatty cuts of meat. Ribs. Fried meat. Berniece Salines. Sausage. Bologna and other processed lunch meats. Salami. Fatback. Hotdogs.  Bratwurst. Salted nuts and seeds. Canned beans with added salt. Canned or smoked fish. Whole eggs or egg yolks. Chicken or [place] with skin. Dairy Whole or 2% milk, cream, and half-and-half. Whole or full-fat cream cheese. Whole-fat or sweetened yogurt. Full-fat cheese. Nondairy creamers. Whipped toppings. Processed cheese and cheese spreads. Fats and oils Butter. Stick margarine. Lard. Shortening. Ghee. Bacon fat. Tropical oils, such as coconut, palm kernel, or palm oil. Seasoning and other foods Salted popcorn and pretzels. Onion salt, garlic salt, seasoned salt, table salt, and sea salt. Worcestershire sauce. Tartar sauce. Barbecue sauce. Teriyaki sauce. Soy sauce, including reduced-sodium. Steak sauce. Canned and packaged gravies. Fish sauce. Oyster sauce. Cocktail sauce. Horseradish that you find on the shelf. Ketchup. Mustard. Meat flavorings  and tenderizers. Bouillon cubes. Hot sauce and Tabasco sauce. Premade or packaged marinades. Premade or packaged taco seasonings. Relishes. Regular salad dressings. Where to find more information:  National Heart, Lung, and [place]: https://wilson-eaton.com/  American Heart Association: www.heart.org Summary  The DASH eating plan is a healthy eating plan that has been shown to reduce high blood pressure (hypertension). It may also reduce your risk for type 2 diabetes, heart disease, and stroke.  With the DASH eating plan, you should limit salt (sodium) intake to 2,300 mg a day. If you have hypertension, you may need to reduce your sodium intake to 1,500 mg a day.  When on the DASH eating plan, aim to eat more fresh fruits and vegetables, whole grains, lean proteins, low-fat dairy, and heart-healthy fats.  Work with your health care provider or diet and nutrition specialist (dietitian) to adjust your eating plan to your individual calorie needs. This information is not intended to replace advice given to you by your health care provider. Make sure you  discuss any questions you have with your health care provider. Document Released: 10/14/2011 Document Revised: 10/18/2016 Document Reviewed: 10/18/2016 Elsevier Interactive Patient Education  [employer].   If you have lab work done today you will be contacted with your lab results within the next 2 weeks.  If you have not heard from [place] then please contact us. The fastest way to get your results is to register for My Chart.   IF you received an x-ray today, you will receive an invoice from [facility]. Please contact [facility] at [phone] with questions or concerns regarding your invoice.   IF you received labwork today, you will receive an invoice from [facility]. Please contact [facility] at [phone] with questions or concerns regarding your invoice.   Our billing staff will not be able to assist you with questions regarding bills from these companies.  You will be contacted with the lab results as soon as they are available. The fastest way to get your results is to activate your My Chart account. Instructions are located on the last page of this paperwork. If you have not heard from [place] regarding the results in 2 weeks, please contact this office.       Signed,   Merri Ray, MD [facility].  10/30/18 9:17 AM

## 2018-11-20 ENCOUNTER — Other Ambulatory Visit: Payer: Self-pay

## 2018-11-20 MED ORDER — AMPHETAMINE-DEXTROAMPHETAMINE 10 MG PO TABS: ORAL_TABLET | ORAL | 0 refills | Status: DC

## 2018-11-20 NOTE — Telephone Encounter (Signed)
Patient called needing a refill on his Adderall.

## 2018-12-18 ENCOUNTER — Telehealth: Payer: Self-pay | Admitting: Neurology

## 2018-12-18 ENCOUNTER — Other Ambulatory Visit: Payer: Self-pay | Admitting: Neurology

## 2018-12-18 MED ORDER — AMPHETAMINE-DEXTROAMPHETAMINE 10 MG PO TABS: ORAL_TABLET | ORAL | 0 refills | Status: DC

## 2018-12-18 NOTE — Telephone Encounter (Signed)
Pt is needing a refill on his amphetamine-dextroamphetamine (ADDERALL) 10 MG tablet sent to [facility] on [address]

## 2018-12-18 NOTE — Telephone Encounter (Signed)
I have routed this request to Dr Brett Fairy for review. The pt is due for the medication and [place] registry was verified.

## 2019-01-16 ENCOUNTER — Telehealth: Payer: Self-pay | Admitting: Neurology

## 2019-01-16 ENCOUNTER — Other Ambulatory Visit: Payer: Self-pay | Admitting: Neurology

## 2019-01-16 MED ORDER — AMPHETAMINE-DEXTROAMPHETAMINE 10 MG PO TABS: ORAL_TABLET | ORAL | 0 refills | Status: DC

## 2019-01-16 NOTE — Telephone Encounter (Signed)
Pt has called for a refill on his amphetamine-dextroamphetamine (ADDERALL) 10 MG tablet  [facility] [phone]

## 2019-01-16 NOTE — Telephone Encounter (Signed)
I have routed this request to Dr Brett Fairy for review. The pt is due for the medication and [place] registry was verified.

## 2019-02-15 ENCOUNTER — Telehealth: Payer: Self-pay | Admitting: Neurology

## 2019-02-15 ENCOUNTER — Other Ambulatory Visit: Payer: Self-pay | Admitting: Neurology

## 2019-02-15 MED ORDER — AMPHETAMINE-DEXTROAMPHETAMINE 10 MG PO TABS: ORAL_TABLET | ORAL | 0 refills | Status: DC

## 2019-02-15 NOTE — Telephone Encounter (Signed)
I have routed this request to Dr Brett Fairy for review. The pt is due for the medication and [place] registry was verified.

## 2019-02-15 NOTE — Telephone Encounter (Signed)
Pt is needing a refill on his amphetamine-dextroamphetamine (ADDERALL) 10 MG tablet sent to the [facility] on [address]

## 2019-03-04 ENCOUNTER — Other Ambulatory Visit: Payer: Self-pay | Admitting: Family Medicine

## 2019-03-04 DIAGNOSIS — I10 Essential (primary) hypertension: Secondary | ICD-10-CM

## 2019-03-04 DIAGNOSIS — F329 Major depressive disorder, single episode, unspecified: Secondary | ICD-10-CM

## 2019-03-20 ENCOUNTER — Telehealth: Payer: Self-pay | Admitting: Neurology

## 2019-03-20 ENCOUNTER — Other Ambulatory Visit: Payer: Self-pay | Admitting: Neurology

## 2019-03-20 MED ORDER — AMPHETAMINE-DEXTROAMPHETAMINE 10 MG PO TABS: ORAL_TABLET | ORAL | 0 refills | Status: DC

## 2019-03-20 NOTE — Telephone Encounter (Signed)
Pt is needing a refill on his amphetamine-dextroamphetamine (ADDERALL) 10 MG tablet sent to the [facility] on [address]

## 2019-03-20 NOTE — Telephone Encounter (Signed)
I have routed this request to Dr Brett Fairy for review. The pt is due for the medication and [place] registry was verified.

## 2019-04-25 ENCOUNTER — Other Ambulatory Visit: Payer: Self-pay | Admitting: Family Medicine

## 2019-04-25 DIAGNOSIS — I10 Essential (primary) hypertension: Secondary | ICD-10-CM

## 2019-04-25 NOTE — Telephone Encounter (Signed)
Requested medication (s) are due for refill today: yes  Requested medication (s) are on the active medication list: yes  Last refill:  01/18/19  Future visit scheduled: No  Notes to clinic:  Unable to refill per protocol. No future visit scheduled. OV in 10/2018 increased pt's mediation to 58m but also a prescription for 2.537min current med list.     Requested Prescriptions  Pending Prescriptions Disp Refills   amLODipine (NORVASC) 5 MG tablet [Pharmacy Med Name: AMLODIPINE BESYLATE 5MG TABLETS] 90 tablet 1    Sig: TAKE 1 TABLET(5 MG) BY MOUTH DAILY     Cardiovascular:  Calcium Channel Blockers Failed - 04/25/2019 12:58 PM      Failed - Last BP in normal range    BP Readings from Last 1 Encounters:  10/24/18 (!) 142/90         Failed - Valid encounter within last 6 months    Recent Outpatient Visits          6 months ago Essential hypertension   [facility]   7 months ago Depression, unspecified depression type   [facility]   1 year ago Annual physical exam   [facility]   2 years ago Need for prophylactic vaccination and inoculation against influenza   [facility]   2 years ago Depression   [facility]

## 2019-05-21 ENCOUNTER — Telehealth: Payer: Self-pay | Admitting: Neurology

## 2019-05-21 NOTE — Telephone Encounter (Signed)
Pt is requesting a refill of amphetamine-dextroamphetamine (ADDERALL) 10 MG tablet , to be sent to [facility], [place]

## 2019-05-22 ENCOUNTER — Other Ambulatory Visit: Payer: Self-pay | Admitting: Neurology

## 2019-05-22 ENCOUNTER — Other Ambulatory Visit: Payer: Self-pay | Admitting: Family Medicine

## 2019-05-22 DIAGNOSIS — I10 Essential (primary) hypertension: Secondary | ICD-10-CM

## 2019-05-22 MED ORDER — AMPHETAMINE-DEXTROAMPHETAMINE 10 MG PO TABS: ORAL_TABLET | ORAL | 0 refills | Status: DC

## 2019-05-22 NOTE — Telephone Encounter (Signed)
I have routed this request to Dr Brett Fairy for review. The pt is due for the medication and [place] registry was verified.

## 2019-05-31 ENCOUNTER — Other Ambulatory Visit: Payer: Self-pay | Admitting: Neurology

## 2019-05-31 ENCOUNTER — Telehealth: Payer: Self-pay | Admitting: Neurology

## 2019-05-31 MED ORDER — TRAZODONE HCL 50 MG PO TABS: 50.0000 mg | ORAL_TABLET | Freq: Every evening | ORAL | 0 refills | Status: DC | PRN

## 2019-05-31 NOTE — Telephone Encounter (Signed)
Script sent for the patient. No refills to make sure apt kept

## 2019-05-31 NOTE — Telephone Encounter (Signed)
Pt has called asking if Dr Brett Fairy will reconsider the request of refilling his traZODone (DESYREL) 50 MG tablet since he has been on it for so long and since he has an appointment on 08-06.  Please call

## 2019-06-11 ENCOUNTER — Encounter: Payer: Self-pay | Admitting: Neurology

## 2019-06-14 ENCOUNTER — Ambulatory Visit: Payer: BLUE CROSS/BLUE SHIELD | Admitting: Adult Health

## 2019-07-02 ENCOUNTER — Telehealth: Payer: Self-pay | Admitting: Neurology

## 2019-07-02 ENCOUNTER — Other Ambulatory Visit: Payer: Self-pay | Admitting: Neurology

## 2019-07-02 MED ORDER — AMPHETAMINE-DEXTROAMPHETAMINE 10 MG PO TABS: ORAL_TABLET | ORAL | 0 refills | Status: DC

## 2019-07-02 NOTE — Telephone Encounter (Signed)
Pt is needing a refill on his amphetamine-dextroamphetamine (ADDERALL) 10 MG tablet sent to the [facility] on [address]

## 2019-07-02 NOTE — Telephone Encounter (Signed)
I have routed this request to Dr Brett Fairy for review. The pt is due for the medication and [place] registry was verified.

## 2019-08-06 ENCOUNTER — Other Ambulatory Visit: Payer: Self-pay | Admitting: Neurology

## 2019-08-06 ENCOUNTER — Telehealth: Payer: Self-pay | Admitting: Neurology

## 2019-08-06 MED ORDER — AMPHETAMINE-DEXTROAMPHETAMINE 10 MG PO TABS: ORAL_TABLET | ORAL | 0 refills | Status: DC

## 2019-08-06 NOTE — Telephone Encounter (Signed)
Pt is requesting a refill of amphetamine-dextroamphetamine (ADDERALL) 10 MG tablet, to be sent to [facility], [address] [place]

## 2019-08-06 NOTE — Telephone Encounter (Signed)
I have routed this request to Dr Brett Fairy for review. The pt is due for the medication and [place] registry was verified.

## 2019-08-29 ENCOUNTER — Other Ambulatory Visit: Payer: Self-pay | Admitting: Family Medicine

## 2019-08-29 ENCOUNTER — Other Ambulatory Visit: Payer: Self-pay | Admitting: Neurology

## 2019-08-29 DIAGNOSIS — I10 Essential (primary) hypertension: Secondary | ICD-10-CM

## 2019-08-29 DIAGNOSIS — F329 Major depressive disorder, single episode, unspecified: Secondary | ICD-10-CM

## 2019-08-29 MED ORDER — TRAZODONE HCL 50 MG PO TABS: 50.0000 mg | ORAL_TABLET | Freq: Every evening | ORAL | 0 refills | Status: DC | PRN

## 2019-08-29 NOTE — Telephone Encounter (Signed)
Requested medication (s) are due for refill today: yes  Requested medication (s) are on the active medication list: yes  Last refill:  05/30/2019  Future visit scheduled: no  Notes to clinic:  Overdue for office visit  Review for refill   Requested Prescriptions  Pending Prescriptions Disp Refills   sertraline (ZOLOFT) 50 MG tablet [Pharmacy Med Name: SERTRALINE 50MG TABLETS] 90 tablet 1    Sig: TAKE 1 TABLET(50 MG) BY MOUTH DAILY     Psychiatry:  Antidepressants - SSRI Failed - 08/29/2019  3:55 AM      Failed - Valid encounter within last 6 months    Recent Outpatient Visits          10 months ago Essential hypertension   [facility] at [facility], [facility]   11 months ago Depression, unspecified depression type   [facility], [facility]   2 years ago Annual physical exam   [facility], [facility]   3 years ago Need for prophylactic vaccination and inoculation against influenza   [facility], [facility]   3 years ago Depression   [facility] [place], [place]             Passed - Completed PHQ-2 or PHQ-9 in the last 360 days.       amLODipine (NORVASC) 2.5 MG tablet [Pharmacy Med Name: AMLODIPINE BESYLATE 2.5MG TABLETS] 90 tablet 1    Sig: TAKE 1 TABLET(2.5 MG) BY MOUTH DAILY     Cardiovascular:  Calcium Channel Blockers Failed - 08/29/2019  3:55 AM      Failed - Last BP in normal range    BP Readings from Last 1 Encounters:  10/24/18 (!) 142/90         Failed - Valid encounter within last 6 months    Recent Outpatient Visits          10 months ago Essential hypertension   [facility], [place]   11 months ago Depression, unspecified depression type   [facility], [facility]   2 years ago Annual physical exam   [facility], [facility]   3 years ago Need for prophylactic vaccination and inoculation against  influenza   [facility], [facility]   3 years ago Depression   [facility], [facility]

## 2019-08-31 NOTE — Telephone Encounter (Signed)
Patient is requesting a refill of the following medications: Requested Prescriptions   Pending Prescriptions Disp Refills  . sertraline (ZOLOFT) 50 MG tablet [Pharmacy Med Name: SERTRALINE 50MG TABLETS] 90 tablet 1    Sig: TAKE 1 TABLET(50 MG) BY MOUTH DAILY   Signed Prescriptions Disp Refills  . amLODipine (NORVASC) 2.5 MG tablet 90 tablet 1    Sig: TAKE 1 TABLET(2.5 MG) BY MOUTH DAILY    Authorizing Provider: [name]    Ordering User: Teresita Madura    Date of patient request: 08/29/19 Last office visit: 10/24/2018 Date of last refill: 03/04/2019 Last refill amount: 90 Follow up time period per chart: 09/20/19

## 2019-09-02 NOTE — Telephone Encounter (Signed)
Refill ordered, but please schedule for follow-up appointment within the next 1-2 months.

## 2019-09-17 ENCOUNTER — Other Ambulatory Visit: Payer: Self-pay | Admitting: Neurology

## 2019-09-18 MED ORDER — AMPHETAMINE-DEXTROAMPHETAMINE 10 MG PO TABS: ORAL_TABLET | ORAL | 0 refills | Status: DC

## 2019-09-20 ENCOUNTER — Ambulatory Visit: Payer: BLUE CROSS/BLUE SHIELD | Admitting: Neurology

## 2019-09-22 DIAGNOSIS — L130 Dermatitis herpetiformis: Secondary | ICD-10-CM | POA: Insufficient documentation

## 2019-10-12 NOTE — Telephone Encounter (Signed)
Pt has appt on 11/15/19

## 2019-10-17 ENCOUNTER — Telehealth: Payer: Self-pay | Admitting: Neurology

## 2019-10-17 NOTE — Telephone Encounter (Signed)
Pt is asking for a refill on amphetamine-dextroamphetamine (ADDERALL) 10 MG tablet and traZODone (DESYREL) 50 MG tablet [facility] [phone]

## 2019-10-18 ENCOUNTER — Encounter: Payer: Self-pay | Admitting: Neurology

## 2019-10-18 NOTE — Telephone Encounter (Signed)
Refills can not be sent in for the patient due to not being seen. Pt recently cancelled his last apt on 8/6 and again on 11/12. We will need to schedule a sooner apt for the pt in order to provide these refills for the patient. I have sent a mychart message to pt advising him of this. If he calls back please offer apt with NP or MD whichever has an opening. Patient is scheduled on 11/14/19 and if we can get him in sooner we can cancel that appointment.

## 2019-11-14 ENCOUNTER — Ambulatory Visit: Payer: BLUE CROSS/BLUE SHIELD | Admitting: Neurology

## 2019-11-15 ENCOUNTER — Ambulatory Visit (INDEPENDENT_AMBULATORY_CARE_PROVIDER_SITE_OTHER): Payer: 59 | Admitting: Family Medicine

## 2019-11-15 ENCOUNTER — Other Ambulatory Visit: Payer: Self-pay

## 2019-11-15 ENCOUNTER — Encounter: Payer: Self-pay | Admitting: Family Medicine

## 2019-11-15 VITALS — BP 140/94 | HR 96 | Temp 98.3°F | Resp 17 | Ht 72.0 in | Wt 216.0 lb

## 2019-11-15 DIAGNOSIS — F329 Major depressive disorder, single episode, unspecified: Secondary | ICD-10-CM | POA: Diagnosis not present

## 2019-11-15 DIAGNOSIS — F32A Depression, unspecified: Secondary | ICD-10-CM

## 2019-11-15 DIAGNOSIS — I10 Essential (primary) hypertension: Secondary | ICD-10-CM

## 2019-11-15 MED ORDER — AMLODIPINE BESYLATE 5 MG PO TABS: ORAL_TABLET | ORAL | 1 refills | Status: DC

## 2019-11-15 MED ORDER — SERTRALINE HCL 50 MG PO TABS: ORAL_TABLET | ORAL | 1 refills | Status: DC

## 2019-11-15 MED ORDER — AMLODIPINE BESYLATE 2.5 MG PO TABS: ORAL_TABLET | ORAL | 1 refills | Status: DC

## 2019-11-15 NOTE — Patient Instructions (Signed)
Try 7.5 mg of amlodipine to see if that will get blood pressure in a little better control.  I would like to see this below 130/80.  See information below.  Recheck in the next 3 months.  Additionally try to minimize alcohol use, no more than 1/day as that can also affect your pressure.  Zoloft same dose for now.  Thank you for coming in today and let me know if there are questions.  Managing Your Hypertension Hypertension is commonly called high blood pressure. This is when the force of your blood pressing against the walls of your arteries is too strong. Arteries are blood vessels that carry blood from your heart throughout your body. Hypertension forces the heart to work harder to pump blood, and may cause the arteries to become narrow or stiff. Having untreated or uncontrolled hypertension can cause heart attack, stroke, kidney disease, and other problems. What are blood pressure readings? A blood pressure reading consists of a higher number over a lower number. Ideally, your blood pressure should be below 120/80. The first ("top") number is called the systolic pressure. It is a measure of the pressure in your arteries as your heart beats. The second ("bottom") number is called the diastolic pressure. It is a measure of the pressure in your arteries as the heart relaxes. What does my blood pressure reading mean? Blood pressure is classified into four stages. Based on your blood pressure reading, your health care provider may use the following stages to determine what type of treatment you need, if any. Systolic pressure and diastolic pressure are measured in a unit called mm Hg. Normal  Systolic pressure: below 774.  Diastolic pressure: below 80. Elevated  Systolic pressure: 128-786.  Diastolic pressure: below 80. Hypertension stage 1  Systolic pressure: 767-209.  Diastolic pressure: 47-09. Hypertension stage 2  Systolic pressure: 628 or above.  Diastolic pressure: 90 or above. What  health risks are associated with hypertension? Managing your hypertension is an important responsibility. Uncontrolled hypertension can lead to:  A heart attack.  A stroke.  A weakened blood vessel (aneurysm).  Heart failure.  Kidney damage.  Eye damage.  Metabolic syndrome.  Memory and concentration problems. What changes can I make to manage my hypertension? Hypertension can be managed by making lifestyle changes and possibly by taking medicines. Your health care provider will help you make a plan to bring your blood pressure within a normal range. Eating and drinking   Eat a diet that is high in fiber and potassium, and low in salt (sodium), added sugar, and fat. An example eating plan is called the DASH (Dietary Approaches to Stop Hypertension) diet. To eat this way: ? Eat plenty of fresh fruits and vegetables. Try to fill half of your plate at each meal with fruits and vegetables. ? Eat whole grains, such as whole wheat pasta, brown rice, or whole grain bread. Fill about one quarter of your plate with whole grains. ? Eat low-fat diary products. ? Avoid fatty cuts of meat, processed or cured meats, and poultry with skin. Fill about one quarter of your plate with lean proteins such as fish, chicken without skin, beans, eggs, and tofu. ? Avoid premade and processed foods. These tend to be higher in sodium, added sugar, and fat.  Reduce your daily sodium intake. Most people with hypertension should eat less than 1,500 mg of sodium a day.  Limit alcohol intake to no more than 1 drink a day for nonpregnant women and 2 drinks a  day for men. One drink equals 12 oz of beer, 5 oz of wine, or 1 oz of hard liquor. Lifestyle  Work with your health care provider to maintain a healthy body weight, or to lose weight. Ask what an ideal weight is for you.  Get at least 30 minutes of exercise that causes your heart to beat faster (aerobic exercise) most days of the week. Activities may  include walking, swimming, or biking.  Include exercise to strengthen your muscles (resistance exercise), such as weight lifting, as part of your weekly exercise routine. Try to do these types of exercises for 30 minutes at least 3 days a week.  Do not use any products that contain nicotine or tobacco, such as cigarettes and e-cigarettes. If you need help quitting, ask your health care provider.  Control any long-term (chronic) conditions you have, such as high cholesterol or diabetes. Monitoring  Monitor your blood pressure at home as told by your health care provider. Your personal target blood pressure may vary depending on your medical conditions, your age, and other factors.  Have your blood pressure checked regularly, as often as told by your health care provider. Working with your health care provider  Review all the medicines you take with your health care provider because there may be side effects or interactions.  Talk with your health care provider about your diet, exercise habits, and other lifestyle factors that may be contributing to hypertension.  Visit your health care provider regularly. Your health care provider can help you create and adjust your plan for managing hypertension. Will I need medicine to control my blood pressure? Your health care provider may prescribe medicine if lifestyle changes are not enough to get your blood pressure under control, and if:  Your systolic blood pressure is 130 or higher.  Your diastolic blood pressure is 80 or higher. Take medicines only as told by your health care provider. Follow the directions carefully. Blood pressure medicines must be taken as prescribed. The medicine does not work as well when you skip doses. Skipping doses also puts you at risk for problems. Contact a health care provider if:  You think you are having a reaction to medicines you have taken.  You have repeated (recurrent) headaches.  You feel dizzy.  You  have swelling in your ankles.  You have trouble with your vision. Get help right away if:  You develop a severe headache or confusion.  You have unusual weakness or numbness, or you feel faint.  You have severe pain in your chest or abdomen.  You vomit repeatedly.  You have trouble breathing. Summary  Hypertension is when the force of blood pumping through your arteries is too strong. If this condition is not controlled, it may put you at risk for serious complications.  Your personal target blood pressure may vary depending on your medical conditions, your age, and other factors. For most people, a normal blood pressure is less than 120/80.  Hypertension is managed by lifestyle changes, medicines, or both. Lifestyle changes include weight loss, eating a healthy, low-sodium diet, exercising more, and limiting alcohol. This information is not intended to replace advice given to you by your health care provider. Make sure you discuss any questions you have with your health care provider. Document Revised: 02/16/2019 Document Reviewed: 09/22/2016 Elsevier Patient Education  [place].

## 2019-11-15 NOTE — Progress Notes (Signed)
Subjective:  Patient ID: Aaron [name], male    DOB: 04/09/1961  Age: 59 y.o. MRN: [id number]  CC:  Chief Complaint  Patient presents with  . Medication Refill    zoloft norvasc     HPI Aaron [name] presents for    Depression: Treated with Zoloft 50 mg daily. Occasional missed dose. No SI, HI.  Feels like issues are stable - meeting with church divorce support group.  Alcohol - less recently, 10 per week. Some days none - some days 2.   Depression screen [facility] 2/9 11/15/2019 10/24/2018 09/20/2018 06/03/2017 08/06/2016  Decreased Interest 2 0 0 1 0  Down, Depressed, Hopeless 2 0 0 3 0  PHQ - 2 Score 4 0 0 4 0  Altered sleeping 3 - - 3 -  Tired, decreased energy 2 - - 3 -  Change in appetite 0 - - 0 -  Feeling bad or failure about yourself  0 - - 3 -  Trouble concentrating 2 - - 3 -  Moving slowly or fidgety/restless 0 - - 3 -  Suicidal thoughts 0 - - 0 -  PHQ-9 Score 11 - - 19 -  Difficult doing work/chores - - - Very difficult -   Also treated by sleep medicine, [name] for restless leg syndrome, history of ADHD.  Continued on Adderall, prescribed by [name], on trazodone for sleep.   Hypertension: Amlodipine increased from 2.88m to 535min 10/2018 - planned 6 week follow up, and salt avoidance.  Has not been seen d/t covid pandemic.  Home readings: up and down - up to 160/100 at the highest., usually 140/90 range. Higher on left than right at times.  exercising regularly.   BP Readings from Last 3 Encounters:  11/15/19 (!) 140/94  10/24/18 (!) 142/90  09/20/18 (!) 146/84   Lab Results  Component Value Date   CREATININE 1.29 (H) 09/20/2018   Last ate fruit few hrs ago. Hx of vagal reaction after blood draws.    History Patient Active Problem List   Diagnosis Date Noted  . ADHD   . Insomnia 01/09/2015  . Sleep apnea syndrome   . RLS (restless legs syndrome)   . GERD 08/19/2009  . GASTRITIS 08/19/2009  . CELIAC SPRUE 07/23/2009  . DIARRHEA  07/02/2009  . INSOMNIA-SLEEP DISORDER-UNSPEC 06/30/2009  . ABDOMINAL PAIN OTHER SPECIFIED SITE 06/30/2009  . DEPRESSION 06/05/2008  . OBSTRUCTIVE SLEEP APNEA 06/05/2008  . RESTLESS LEGS SYNDROME 06/05/2008   Past Medical History:  Diagnosis Date  . ADHD   . Celiac disease   . GERD (gastroesophageal reflux disease)   . RLS (restless legs syndrome)   . Sleep apnea syndrome    [place] [facility]  12[place]2-- 2004   Past Surgical History:  Procedure Laterality Date  . APPENDECTOMY    . HERNIA REPAIR     Allergies  Allergen Reactions  . Gluten Meal Other (See Comments)   Prior to Admission medications   Medication Sig Start Date End Date Taking? Authorizing Provider  amLODipine (NORVASC) 2.5 MG tablet TAKE 1 TABLET(2.5 MG) BY MOUTH DAILY 08/31/19   GrWendie AgresteMD  amLODipine (NORVASC) 5 MG tablet TAKE 1 TABLET(5 MG) BY MOUTH DAILY 04/26/19   GrWendie AgresteMD  amphetamine-dextroamphetamine (ADDERALL) 10 MG tablet May take 1-2 tablets by mouth daily. 09/18/19   Dohmeier, CaAsencion PartridgeMD  ferrous sulfate 325 (65 FE) MG tablet Take 325 mg by mouth daily. 2 times  weekly    [provider]  fish oil-omega-3 fatty acids 1000 MG capsule Take 2 g by mouth 2 (two) times a week.     [provider]  sertraline (ZOLOFT) 50 MG tablet TAKE 1 TABLET(50 MG) BY MOUTH DAILY 09/02/19   [name], MD  sulfamethoxazole-trimethoprim (BACTRIM DS,SEPTRA DS) 800-160 MG tablet TK 1 T PO BID 06/06/18   [provider]  traZODone (DESYREL) 50 MG tablet Take 1 tablet (50 mg total) by mouth at bedtime as needed. for sleep 08/29/19   Dohmeier, Asencion Partridge, MD   Social History   Socioeconomic History  . Marital status: Married    Spouse name: Mary  . Number of children: 3  . Years of education: Not on file  . Highest education level: Not on file  Occupational History  . Occupation: Freight forwarder  Tobacco Use  . Smoking status: Never Smoker  . Smokeless tobacco:  Never Used  Substance and Sexual Activity  . Alcohol use: Yes    Alcohol/week: 7.0 standard drinks    Types: 7 Standard drinks or equivalent per week    Comment: Occassionally  . Drug use: No  . Sexual activity: Yes  Other Topics Concern  . Not on file  Social History Narrative   Patient lives at home with wife [name].    Patient has 3 children.    Patient has a BA.    Social Determinants of Health   Financial Resource Strain:   . Difficulty of Paying Living Expenses: Not on file  Food Insecurity:   . Worried About Charity fundraiser in the Last Year: Not on file  . Ran Out of Food in the Last Year: Not on file  Transportation Needs:   . Lack of Transportation (Medical): Not on file  . Lack of Transportation (Non-Medical): Not on file  Physical Activity:   . Days of Exercise per Week: Not on file  . Minutes of Exercise per Session: Not on file  Stress:   . Feeling of Stress : Not on file  Social Connections:   . Frequency of Communication with Friends and Family: Not on file  . Frequency of Social Gatherings with Friends and Family: Not on file  . Attends Religious Services: Not on file  . Active Member of Clubs or Organizations: Not on file  . Attends Archivist Meetings: Not on file  . Marital Status: Not on file  Intimate Partner Violence:   . Fear of Current or Ex-Partner: Not on file  . Emotionally Abused: Not on file  . Physically Abused: Not on file  . Sexually Abused: Not on file    Review of Systems  Constitutional: Negative for fatigue and unexpected weight change.  Eyes: Negative for visual disturbance.  Respiratory: Negative for cough, chest tightness and shortness of breath.   Cardiovascular: Negative for chest pain, palpitations and leg swelling.  Gastrointestinal: Negative for abdominal pain and blood in stool.  Neurological: Negative for dizziness, light-headedness and headaches.      Objective:   Vitals:   11/15/19 1656 11/15/19 1731  11/15/19 1735  BP: 136/88 (!) 142/96 (!) 140/94  Pulse: 96    Resp: 17    Temp: 98.3 F (36.8 C)    TempSrc: Oral    SpO2: 98%    Weight: 216 lb (98 kg)    Height: 6' (1.829 m)    left and right arm BP assessment as above.   Physical Exam Vitals reviewed.  Constitutional:  Appearance: He is well-developed.  HENT:     Head: Normocephalic and atraumatic.  Eyes:     Pupils: Pupils are equal, round, and reactive to light.  Neck:     Vascular: No carotid bruit or JVD.  Cardiovascular:     Rate and Rhythm: Normal rate and regular rhythm.     Heart sounds: Normal heart sounds. No murmur.  Pulmonary:     Effort: Pulmonary effort is normal.     Breath sounds: Normal breath sounds. No rales.  Skin:    General: Skin is warm and dry.  Neurological:     Mental Status: He is alert and oriented to person, place, and time.     Assessment & Plan:  Aaron [name] is a 59 y.o. male . Essential hypertension - Plan: Basic metabolic panel, amLODipine (NORVASC) 5 MG tablet, amLODipine (NORVASC) 2.5 MG tablet  -Decreased control.  Change Norvasc to 7.5 mg total per day with home monitoring, RTC precautions, labs pending  Depression, unspecified depression type Reactive depression - Plan: sertraline (ZOLOFT) 50 MG tablet  -Overall stable, continue Zoloft 50 mg daily.  Continue support group,, decrease alcohol recommended.  Meds ordered this encounter  Medications  . amLODipine (NORVASC) 5 MG tablet    Sig: TAKE 1 TABLET(5 MG) BY MOUTH DAILY    Dispense:  90 tablet    Refill:  1  . amLODipine (NORVASC) 2.5 MG tablet    Sig: TAKE 1 TABLET(2.5 MG) BY MOUTH DAILY    Dispense:  90 tablet    Refill:  1  . sertraline (ZOLOFT) 50 MG tablet    Sig: 1 po qd    Dispense:  90 tablet    Refill:  1   Patient Instructions  Try 7.5 mg of amlodipine to see if that will get blood pressure in a little better control.  I would like to see this below 130/80.  See information below.  Recheck in  the next 3 months.  Additionally try to minimize alcohol use, no more than 1/day as that can also affect your pressure.  Zoloft same dose for now.  Thank you for coming in today and let me know if there are questions.  Managing Your Hypertension Hypertension is commonly called high blood pressure. This is when the force of your blood pressing against the walls of your arteries is too strong. Arteries are blood vessels that carry blood from your heart throughout your body. Hypertension forces the heart to work harder to pump blood, and may cause the arteries to become narrow or stiff. Having untreated or uncontrolled hypertension can cause heart attack, stroke, kidney disease, and other problems. What are blood pressure readings? A blood pressure reading consists of a higher number over a lower number. Ideally, your blood pressure should be below 120/80. The first ("top") number is called the systolic pressure. It is a measure of the pressure in your arteries as your heart beats. The second ("bottom") number is called the diastolic pressure. It is a measure of the pressure in your arteries as the heart relaxes. What does my blood pressure reading mean? Blood pressure is classified into four stages. Based on your blood pressure reading, your health care provider may use the following stages to determine what type of treatment you need, if any. Systolic pressure and diastolic pressure are measured in a unit called mm Hg. Normal  Systolic pressure: below 149.  Diastolic pressure: below 80. Elevated  Systolic pressure: 702-637.  Diastolic pressure: below 80.  Hypertension stage 1  Systolic pressure: 250-539.  Diastolic pressure: 76-73. Hypertension stage 2  Systolic pressure: 419 or above.  Diastolic pressure: 90 or above. What health risks are associated with hypertension? Managing your hypertension is an important responsibility. Uncontrolled hypertension can lead to:  A heart attack.  A  stroke.  A weakened blood vessel (aneurysm).  Heart failure.  Kidney damage.  Eye damage.  Metabolic syndrome.  Memory and concentration problems. What changes can I make to manage my hypertension? Hypertension can be managed by making lifestyle changes and possibly by taking medicines. Your health care provider will help you make a plan to bring your blood pressure within a normal range. Eating and drinking   Eat a diet that is high in fiber and potassium, and low in salt (sodium), added sugar, and fat. An example eating plan is called the DASH (Dietary Approaches to Stop Hypertension) diet. To eat this way: ? Eat plenty of fresh fruits and vegetables. Try to fill half of your plate at each meal with fruits and vegetables. ? Eat whole grains, such as whole wheat pasta, brown rice, or whole grain bread. Fill about one quarter of your plate with whole grains. ? Eat low-fat diary products. ? Avoid fatty cuts of meat, processed or cured meats, and poultry with skin. Fill about one quarter of your plate with lean proteins such as fish, chicken without skin, beans, eggs, and tofu. ? Avoid premade and processed foods. These tend to be higher in sodium, added sugar, and fat.  Reduce your daily sodium intake. Most people with hypertension should eat less than 1,500 mg of sodium a day.  Limit alcohol intake to no more than 1 drink a day for nonpregnant women and 2 drinks a day for men. One drink equals 12 oz of beer, 5 oz of wine, or 1 oz of hard liquor. Lifestyle  Work with your health care provider to maintain a healthy body weight, or to lose weight. Ask what an ideal weight is for you.  Get at least 30 minutes of exercise that causes your heart to beat faster (aerobic exercise) most days of the week. Activities may include walking, swimming, or biking.  Include exercise to strengthen your muscles (resistance exercise), such as weight lifting, as part of your weekly exercise routine. Try  to do these types of exercises for 30 minutes at least 3 days a week.  Do not use any products that contain nicotine or tobacco, such as cigarettes and e-cigarettes. If you need help quitting, ask your health care provider.  Control any long-term (chronic) conditions you have, such as high cholesterol or diabetes. Monitoring  Monitor your blood pressure at home as told by your health care provider. Your personal target blood pressure may vary depending on your medical conditions, your age, and other factors.  Have your blood pressure checked regularly, as often as told by your health care provider. Working with your health care provider  Review all the medicines you take with your health care provider because there may be side effects or interactions.  Talk with your health care provider about your diet, exercise habits, and other lifestyle factors that may be contributing to hypertension.  Visit your health care provider regularly. Your health care provider can help you create and adjust your plan for managing hypertension. Will I need medicine to control my blood pressure? Your health care provider may prescribe medicine if lifestyle changes are not enough to get your blood pressure under control,  and if:  Your systolic blood pressure is 130 or higher.  Your diastolic blood pressure is 80 or higher. Take medicines only as told by your health care provider. Follow the directions carefully. Blood pressure medicines must be taken as prescribed. The medicine does not work as well when you skip doses. Skipping doses also puts you at risk for problems. Contact a health care provider if:  You think you are having a reaction to medicines you have taken.  You have repeated (recurrent) headaches.  You feel dizzy.  You have swelling in your ankles.  You have trouble with your vision. Get help right away if:  You develop a severe headache or confusion.  You have unusual weakness or  numbness, or you feel faint.  You have severe pain in your chest or abdomen.  You vomit repeatedly.  You have trouble breathing. Summary  Hypertension is when the force of blood pumping through your arteries is too strong. If this condition is not controlled, it may put you at risk for serious complications.  Your personal target blood pressure may vary depending on your medical conditions, your age, and other factors. For most people, a normal blood pressure is less than 120/80.  Hypertension is managed by lifestyle changes, medicines, or both. Lifestyle changes include weight loss, eating a healthy, low-sodium diet, exercising more, and limiting alcohol. This information is not intended to replace advice given to you by your health care provider. Make sure you discuss any questions you have with your health care provider. Document Revised: 02/16/2019 Document Reviewed: 09/22/2016 Elsevier Patient Education  2020 [employer].      Signed, Merri Ray, MD [facility]

## 2019-11-16 LAB — BASIC METABOLIC PANEL
BUN/Creatinine Ratio: 8 — ABNORMAL LOW (ref 10–24)
BUN: 9 mg/dL (ref 8–27)
CO2: 27 mmol/L (ref 20–29)
Calcium: 9.4 mg/dL (ref 8.6–10.2)
Chloride: 101 mmol/L (ref 96–106)
Creatinine, Ser: 1.11 mg/dL (ref 0.76–1.27)
GFR calc Af Amer: 83 mL/min/{1.73_m2} (ref >59)
GFR calc non Af Amer: 72 mL/min/{1.73_m2} (ref >59)
Glucose: 84 mg/dL (ref 65–99)
Potassium: 4.4 mmol/L (ref 3.5–5.2)
Sodium: 139 mmol/L (ref 134–144)

## 2019-11-17 ENCOUNTER — Encounter: Payer: Self-pay | Admitting: Family Medicine

## 2019-11-20 ENCOUNTER — Telehealth: Payer: Self-pay | Admitting: Neurology

## 2019-11-20 NOTE — Telephone Encounter (Signed)
Called the patient because when we rescheduled his apt from cancelling with Dr Brett Fairy, I was thinking we had rescheduled him for Jan 13,2021. I dont see him down for an apt though and dont see where he called and cancelled. I have called the patient and LVM, I have blocked Megan 2 pm for tomorrow and was going to offer that to him or I can create a 4 pm slot with Dr Brett Fairy. We do need to see him for medication management refills.

## 2019-11-21 ENCOUNTER — Encounter: Payer: Self-pay | Admitting: Neurology

## 2019-11-21 ENCOUNTER — Other Ambulatory Visit: Payer: Self-pay

## 2019-11-21 ENCOUNTER — Ambulatory Visit (INDEPENDENT_AMBULATORY_CARE_PROVIDER_SITE_OTHER): Payer: 59 | Admitting: Neurology

## 2019-11-21 VITALS — BP 137/87 | HR 69 | Temp 96.9°F | Ht 72.0 in | Wt 215.0 lb

## 2019-11-21 DIAGNOSIS — E7421 Galactosemia: Secondary | ICD-10-CM | POA: Diagnosis not present

## 2019-11-21 DIAGNOSIS — F908 Attention-deficit hyperactivity disorder, other type: Secondary | ICD-10-CM

## 2019-11-21 DIAGNOSIS — G4721 Circadian rhythm sleep disorder, delayed sleep phase type: Secondary | ICD-10-CM | POA: Diagnosis not present

## 2019-11-21 MED ORDER — TRAZODONE HCL 50 MG PO TABS: 50.0000 mg | ORAL_TABLET | Freq: Every evening | ORAL | 3 refills | Status: DC | PRN

## 2019-11-21 MED ORDER — AMPHETAMINE-DEXTROAMPHETAMINE 10 MG PO TABS: ORAL_TABLET | ORAL | 0 refills | Status: DC

## 2019-11-21 NOTE — Patient Instructions (Signed)

## 2019-11-21 NOTE — Progress Notes (Signed)
SLEEP MEDICINE CLINIC   Provider:  Larey Seat, M D  Referring Provider: [name], MD Primary Care Physician:  [name], MD  Chief Complaint  Patient presents with  . Follow-up    pt alone, rm 10. pt presents today to be seen for yearly medication refills. he was over a yr. was rescheduled from last week when MD was out.      Aaron [name] is a 59 y.o. male, seen here as a revisit for RLS He also has a new concern about circadian rhythym changes and ADHD.  The patient reports that he has always had problems to sleep well and continuously for a long time but he has begun to push his bedtime more more into the morning hours and accordingly his circadian rhythm has shifted.  Since he is also treated for ADHD and restless legs there may be some medication side effects that also influence this pattern his daytime sleepiness was endorsed on the Epworth scale at 12 points he feels subjectively that his restless legs have gotten better but that he also goes to bed later because he feels he has the highest level of productivity around midnight and later.  He is also a Physicist, medical and so when he reaches a certain workflow he likes to " ride the wave '. He naps in daytime.  He remains physically active. It ADHD got worse in his 24s.     I have the pleasure of meeting with Aaron [name] today in a yearly revisit, he has followed in this office for restless legs, has participated in iron IV trial and has also improved insomnia on trazodone.  He has struggled with insomnia just as have his parents.  Restless legs also seem to be an inherited trait.  Since the IV iron was applied his restless legs have significantly improved but not completely alleviated but his intensity of the urge to move insistence and the consistency has been much reduced.  He endorses the Epworth Sleepiness Scale at 12 out of 24 possible points, last year at this time he endorses at 16 points.  He continues  medication for ADHD adult type /hyperactivity type.    Following up on a sleep study from 2014 , diagnosed with RLS, insomnia- I have the pleasure of seeing Aaron [name] today on 08/22/2017. He had been an enrolled patient and her restless leg trial which finished December 2017. He was given intravenous iron at the time. He felt that he responded beneficial. He now presents with excessive daytime sleepiness, resurgence of restless legs, but also not as bad as it used to be. He does have problems with waking up in the middle of the night not always sure if it's PLMS restless legs or other symptoms. Due to an underlying condition of ADHD he is also treated with stimulants which affect his sleep pattern. There also contribute somewhat to restless legs. He endorsed today the Epworth sleepiness score at 16 points, which is an elevated level. He would like to be further evaluated for the possible gene variant p tyr 362 , a genetic variant of BHL HE 41G. Aaron [name] only light about 10 years ago but his symptoms qualify as restless legs, both of his parents had difficulty sleeping through the night and struggled with insomnia. He has had difficulty sleeping through the night and wakes up between 3 and 5 AM on most days. Gabapentin and iron IV  helped, trazodone helped.     HPI: I  have personally last seen Aaron [name] in 2014 after a diagnosed him with restless leg syndrome related insomnia and had explained to him that his apnea was so mild that CPAP intervention was not justified. In the meantime he has followed up for his celiac disease with a specialist at [facility] in [place], Dr. Marissa Nestle.  He has a family history of psoriasis, he was only diagnosed with celiac disease at age 56. He is unaware of a family history of celiac, Crohn's or any colitis. He is concerned about malabsorption. He used to take po Iron, but stopped after reading " it's bad for older folks" .  His son was diagnosed with IBS, negative  celiac testing. He has sleeping problems, too.   Sleep habits are as follows:His usual bedtime is around midnight, he only sleeps about 2-3 hours on block and then wakes up. His restless legs do no longer bother him that much that he couldn't initiate sleep. He does wake up in the middle of the night however with kicking and he is aware of his mild sleep apnea. Usually he tries to distract himself a little bit watches TV or reads until he is able to fall asleep again. He will get another 2 or 3 hours of sleep and usually sleeps better in the morning hours. He uses Gabapentin for RLS, 400 mg qhs.   Sleep medical history and family sleep history: son has insomnia.  Social history: Patient is not a smoker no tobacco use in any form, 2 mixed drinks a day on average, he drinks coffee only in the morning 2-3 cups a day. No soda, tea is caffeine free  Review of Systems: Out of a complete 14 system review, the patient complains of only the following symptoms, and all other reviewed systems are negative. Hyperactivity, better focus on ritalin.  Did not tolerate adderall.   Epworth score 12/ 24   How likely are you to doze in the following situations: 0 = not likely, 1 = slight chance, 2 = moderate chance, 3 = high chance  Sitting and Reading? Watching Television? Sitting inactive in a public place (theater or meeting)? Lying down in the afternoon when circumstances permit? Sitting and talking to someone? Sitting quietly after lunch without alcohol? In a car, while stopped for a few minutes in traffic? As a passenger in a car for an hour without a break?  Total = 12   , Fatigue severity score 40  , depression score 2, endorses an irresistible urge to move in the late afternoon and evening hours but more so if he was physically very active in daytime before.  [facility] restless leg syndrome quality of life questionnaire:  the patient endorsed a moderate- mild impairment over the last 12 month  - his restless legs doesn't do not keep him from attending social activities,  but they trouble him more concentrated in the afternoon and the evening and sometimes also affect his ability to get up in the morning.   Social History   Socioeconomic History  . Marital status: Married    Spouse name: Aaron [name]  . Number of children: 3  . Years of education: Not on file  . Highest education level: Not on file  Occupational History  . Occupation: Freight forwarder  Tobacco Use  . Smoking status: Never Smoker  . Smokeless tobacco: Never Used  Substance and Sexual Activity  . Alcohol use: Yes    Alcohol/week: 7.0 standard drinks    Types: 7 Standard drinks  or equivalent per week    Comment: Occassionally  . Drug use: No  . Sexual activity: Yes  Other Topics Concern  . Not on file  Social History Narrative   Patient lives at home with wife Aaron [name].    Patient has 3 children.    Patient has a BA.    Social Determinants of Health   Financial Resource Strain:   . Difficulty of Paying Living Expenses: Not on file  Food Insecurity:   . Worried About Charity fundraiser in the Last Year: Not on file  . Ran Out of Food in the Last Year: Not on file  Transportation Needs:   . Lack of Transportation (Medical): Not on file  . Lack of Transportation (Non-Medical): Not on file  Physical Activity:   . Days of Exercise per Week: Not on file  . Minutes of Exercise per Session: Not on file  Stress:   . Feeling of Stress : Not on file  Social Connections:   . Frequency of Communication with Friends and Family: Not on file  . Frequency of Social Gatherings with Friends and Family: Not on file  . Attends Religious Services: Not on file  . Active Member of Clubs or Organizations: Not on file  . Attends Archivist Meetings: Not on file  . Marital Status: Not on file  Intimate Partner Violence:   . Fear of Current or Ex-Partner: Not on file  . Emotionally Abused: Not on file  . Physically Abused:  Not on file  . Sexually Abused: Not on file    Family History  Problem Relation Age of Onset  . Hypertension Mother   . Diabetes Mother   . Hypertension Father   . Heart disease Maternal Grandmother   . Heart disease Maternal Grandfather   . Heart disease Paternal Grandfather   . Colon cancer Neg Hx   . Colon polyps Neg Hx   . [name] disease Neg Hx   . Esophageal cancer Neg Hx     Past Medical History:  Diagnosis Date  . ADHD   . Celiac disease   . GERD (gastroesophageal reflux disease)   . RLS (restless legs syndrome)   . Sleep apnea syndrome    tested  under [facility],  12-2-- 2004    Past Surgical History:  Procedure Laterality Date  . APPENDECTOMY    . HERNIA REPAIR      Current Outpatient Medications  Medication Sig Dispense Refill  . amLODipine (NORVASC) 2.5 MG tablet TAKE 1 TABLET(2.5 MG) BY MOUTH DAILY 90 tablet 1  . amLODipine (NORVASC) 5 MG tablet TAKE 1 TABLET(5 MG) BY MOUTH DAILY 90 tablet 1  . amphetamine-dextroamphetamine (ADDERALL) 10 MG tablet May take 1-2 tablets by mouth daily. 60 tablet 0  . ferrous sulfate 325 (65 FE) MG tablet Take 325 mg by mouth daily. 2 times weekly    . fish oil-omega-3 fatty acids 1000 MG capsule Take 2 g by mouth 2 (two) times a week.     . sertraline (ZOLOFT) 50 MG tablet 1 po qd 90 tablet 1  . sulfamethoxazole-trimethoprim (BACTRIM DS,SEPTRA DS) 800-160 MG tablet TK 1 T PO BID  5  . traZODone (DESYREL) 50 MG tablet Take 1 tablet (50 mg total) by mouth at bedtime as needed. for sleep 30 tablet 0   No current facility-administered medications for this visit.    Allergies as of 11/21/2019 - Review Complete 11/21/2019  Allergen Reaction Noted  .  Gluten meal Other (See Comments) 08/25/2015    Vitals: BP 137/87   Pulse 69   Temp (!) 96.9 F (36.1 C)   Ht 6' (1.829 m)   Wt 215 lb (97.5 kg)   BMI 29.16 kg/m  Last Weight:  Wt Readings from Last 1 Encounters:  11/21/19 215 lb (97.5 kg)   RCV:ELFY mass  index is 29.16 kg/m.     Last Height:   Ht Readings from Last 1 Encounters:  11/21/19 6' (1.829 m)    Physical exam:  General: The patient is awake, alert and appears not in acute distress. The patient is visibly restless.  Head: Normocephalic, atraumatic. Neck is supple. Mallampati 3 ,  neck circumference: 15" Nasal airflow unrestricition , neither TMJ nor Retrognathia is seen.  Cardiovascular:  Regular rate and rhythm without  murmurs or carotid bruit, and without distended neck veins. Respiratory: Lungs are clear to auscultation. Skin:  Without evidence of edema, not evident is bluish discoloration of the feet- was seen a year ago.  Trunk: BMI is 29. The patient's posture is erect.  Neurologic exam : The patient is awake and alert, oriented to place and time.   Memory subjective  described as intact. Attention span & concentration ability appears limited , but he is hyperactive- restless.   Speech is fluent, without dysarthria, no dysphonia , nor aphasia.   Cranial nerves: Pupils are equal and briskly reactive to light. Facial sensation intact to fine touch. Facial motor strength is symmetric and tongue and uvula move midline. Shoulder shrug was symmetrical.  Motor exam:  Normal tone, muscle bulk and symmetric strength in all extremities. Sensory:  Fine touch, pinprick and vibration were normal. Finger-to-nose maneuver  normal without evidence of ataxia, dysmetria or tremor. Gait and station: Patient walks without assistive device and is able unassisted to climb up to the exam table. Strength within normal limits. Stance is stable and normal. Turns with 3  Steps. Romberg testing is  negative. Deep tendon reflexes: in the  upper and lower extremities are symmetric and intact.   Assessment:  After physical and neurologic examination, review of laboratory studies,  Personal review of imaging studies, reports of other /same  Imaging studies,  Results of polysomnography/ neurophysiology  testing and pre-existing records as far as provided in visit., my assessment is positive for PLM and RLS, ADHD medication side effects.   1) No neuropathy but Neurontin helps still with sleep. RLS   2) RLS - irresistible urge to move. Mr. Dugue received iron infusions during a restless leg trial here at [facility] and has felt less impairment by restless legs since the treatment. He is feeling much improved .   3) ADHD , adult form, he has continued Adderall 10 Mg  - refills today.  Worked better than Ritalin. Has not been on Vyvanse.   4) depression, procrastination,  insomnia, currently treated with zoloft,   And trazodone, refills from here.   5) circadian rhythm.   Rv in 12 month with Np or me.   Asencion Partridge Kyoko Elsea MD  11/21/2019   CC: [name], [address] [place],  [place] [phone]

## 2019-12-21 ENCOUNTER — Telehealth: Payer: Self-pay | Admitting: Neurology

## 2019-12-21 NOTE — Telephone Encounter (Signed)
Pt is needing his amphetamine-dextroamphetamine (ADDERALL) 10 MG tablet refilled and sent to the [facility] on [address]

## 2019-12-24 ENCOUNTER — Other Ambulatory Visit: Payer: Self-pay | Admitting: Neurology

## 2019-12-24 MED ORDER — AMPHETAMINE-DEXTROAMPHETAMINE 10 MG PO TABS: ORAL_TABLET | ORAL | 0 refills | Status: DC

## 2019-12-24 NOTE — Telephone Encounter (Signed)
I have routed this request to Dr Brett Fairy for review. The pt is due for the medication and [place] registry was verified.

## 2020-01-23 ENCOUNTER — Telehealth: Payer: Self-pay | Admitting: Neurology

## 2020-01-23 ENCOUNTER — Other Ambulatory Visit: Payer: Self-pay | Admitting: Neurology

## 2020-01-23 MED ORDER — AMPHETAMINE-DEXTROAMPHETAMINE 10 MG PO TABS: ORAL_TABLET | ORAL | 0 refills | Status: DC

## 2020-01-23 NOTE — Telephone Encounter (Signed)
I have routed this request to Dr Brett Fairy for review. The pt is due for the medication and [place] registry was verified.

## 2020-01-23 NOTE — Telephone Encounter (Signed)
1) Medication(s) Requested (by name): amphetamine-dextroamphetamine (ADDERALL) 10 MG tablet  2) Pharmacy of Choice: [facility] [address], [address]  [place], [place] [id number]

## 2020-02-14 ENCOUNTER — Ambulatory Visit: Payer: 59 | Admitting: Family Medicine

## 2020-03-12 ENCOUNTER — Telehealth: Payer: Self-pay | Admitting: Neurology

## 2020-03-12 ENCOUNTER — Other Ambulatory Visit: Payer: Self-pay | Admitting: Neurology

## 2020-03-12 MED ORDER — AMPHETAMINE-DEXTROAMPHETAMINE 10 MG PO TABS: ORAL_TABLET | ORAL | 0 refills | Status: DC

## 2020-03-12 NOTE — Telephone Encounter (Signed)
I have routed this request to Dr Brett Fairy for review. The pt is due for the medication and [place] registry was verified.

## 2020-03-12 NOTE — Telephone Encounter (Signed)
Pt is needing a refill on amphetamine-dextroamphetamine (ADDERALL) 10 MG tablet sent in to the [facility] on [address]

## 2020-03-27 ENCOUNTER — Encounter: Payer: Self-pay | Admitting: Family Medicine

## 2020-03-27 ENCOUNTER — Other Ambulatory Visit: Payer: Self-pay

## 2020-03-27 ENCOUNTER — Ambulatory Visit (INDEPENDENT_AMBULATORY_CARE_PROVIDER_SITE_OTHER): Payer: 59 | Admitting: Family Medicine

## 2020-03-27 VITALS — BP 131/86 | HR 88 | Temp 98.6°F | Ht 72.0 in | Wt 201.8 lb

## 2020-03-27 DIAGNOSIS — I10 Essential (primary) hypertension: Secondary | ICD-10-CM

## 2020-03-27 DIAGNOSIS — M109 Gout, unspecified: Secondary | ICD-10-CM | POA: Diagnosis not present

## 2020-03-27 DIAGNOSIS — F329 Major depressive disorder, single episode, unspecified: Secondary | ICD-10-CM

## 2020-03-27 DIAGNOSIS — Z13220 Encounter for screening for lipoid disorders: Secondary | ICD-10-CM | POA: Diagnosis not present

## 2020-03-27 MED ORDER — AMLODIPINE BESYLATE 10 MG PO TABS: 10.0000 mg | ORAL_TABLET | Freq: Every day | ORAL | 1 refills | Status: DC

## 2020-03-27 MED ORDER — SERTRALINE HCL 50 MG PO TABS: ORAL_TABLET | ORAL | 1 refills | Status: DC

## 2020-03-27 MED ORDER — COLCHICINE 0.6 MG PO TABS: ORAL_TABLET | ORAL | 0 refills | Status: DC

## 2020-03-27 NOTE — Patient Instructions (Addendum)
  Increase amlodipine to 93m pill once per day. If any new side effects, let me know.   Based on your description I suspect you did have gout flares in your feet and knee.  I am glad to hear the right foot is improving.  Okay to try Advil or Aleve temporarily for up to a week if that is improving symptoms.  If not improving or any worsening, return for recheck right away.  Colchicine prescribed for next gout flare if needed, 2 pills initially with 1 pill an hour later if still symptomatic.  No change in Zoloft for now.  Lab only visit within the next 1 week to check a gout test and cholesterol levels.  Recheck in 3 months.  Let me know if there are questions sooner and take care  If you have lab work done today you will be contacted with your lab results within the next 2 weeks.  If you have not heard from [facility] please contact [place] The fastest way to get your results is to register for My Chart.   IF you received an x-ray today, you will receive an [facility] from [facility]. Please contact [facility] at [phone] questions or concerns regarding your invoice.   IF you received labwork today, you will receive an invoice from [facility] Please contact [facility] at [phone] questions or concerns regarding your invoice.   Our billing staff will not be able to assist you with questions regarding bills from these companies.  You will be contacted with the lab results as soon as they are available. The fastest way to get your results is to activate your My Chart account. Instructions are located on the last page of this paperwork. If you have not heard from [place] the results in 2 weeks, please contact this office.

## 2020-03-27 NOTE — Progress Notes (Signed)
Subjective:  Patient ID: Aaron [name], male    DOB: Mar 24, 1961  Age: 59 y.o. MRN: [id number]  CC:  Chief Complaint  Patient presents with  . Hypertension  . Depression  . gout    thinks he may be having a gout flare    HPI Aaron [name] presents for   Hypertension: Amlodipine 7.5 mg daily.  Dose was increased at his January visit.  Also discussed minimizing alcohol use to no more than 1/day for improved blood pressure control as well. No new side effects at this dose.  Home readings:  140/90 range.  Alcohol - cut back to no more than 1 on average.  BP Readings from Last 3 Encounters:  03/27/20 131/86  11/21/19 137/87  11/15/19 (!) 140/94   Lab Results  Component Value Date   CREATININE 1.11 11/15/2019   Lab Results  Component Value Date   CHOL 146 06/03/2017   HDL 49 06/03/2017   LDLCALC 71 06/03/2017   TRIG 132 06/03/2017   CHOLHDL 3.0 06/03/2017    Depression: Reported overall stable symptoms in January, continued on Zoloft at that time 50 mg daily as well as support group and decreased alcohol recommended as above.  He has been followed by sleep specialist, trazodone at bedtime to help with sleep, due for follow-up.  History of restless leg syndrome, ADHD.  Previous iron infusions for restless leg symptoms with improvement.  Good days and bad days.  Feels overall the same, eventually would like to get off zoloft. would like to remain same dose for now.  adhd - concentration has been issue - takes Adderall and trazodone for sleep.  Time has been helpful with divorce. Would like to remain on same meds.    Depression screen [facility] 2/9 03/27/2020 11/15/2019 10/24/2018 09/20/2018 06/03/2017  Decreased Interest 1 2 0 0 1  Down, Depressed, Hopeless 0 2 0 0 3  PHQ - 2 Score 1 4 0 0 4  Altered sleeping 3 3 - - 3  Tired, decreased energy 3 2 - - 3  Change in appetite 1 0 - - 0  Feeling bad or failure about yourself  1 0 - - 3  Trouble concentrating 3 2 - - 3  Moving  slowly or fidgety/restless 3 0 - - 3  Suicidal thoughts 0 0 - - 0  PHQ-9 Score 15 11 - - 19  Difficult doing work/chores - - - - Very difficult   Gout? Reports history of gout - about 5 years ago.  Recent flare in March into April, great toe of right foot and left foot, some other toes in R foot, areas improved then moved.  Felt febrile - but did not measure. None recent.  Had in R knee end of march for few days.  Better recently.  Tx: cherry juice. Some relief.  Colchicine in past - no daily med.  No results found for: LABURIC    History Patient Active Problem List   Diagnosis Date Noted  . Circadian rhythm sleep disorder, delayed sleep phase type 11/21/2019  . Adult residual type attention deficit hyperactivity disorder (ADHD) 11/21/2019  . Galactosemia ([place]) 11/21/2019  . Dermatitis herpetiformis 09/22/2019  . ADHD   . Iron deficiency anemia 08/25/2015  . Insomnia 01/09/2015  . Sleep apnea syndrome   . RLS (restless legs syndrome)   . GERD 08/19/2009  . GASTRITIS 08/19/2009  . CELIAC SPRUE 07/23/2009  . DIARRHEA 07/02/2009  . INSOMNIA-SLEEP DISORDER-UNSPEC 06/30/2009  . ABDOMINAL PAIN  OTHER SPECIFIED SITE 06/30/2009  . DEPRESSION 06/05/2008  . OBSTRUCTIVE SLEEP APNEA 06/05/2008  . RESTLESS LEGS SYNDROME 06/05/2008   Past Medical History:  Diagnosis Date  . ADHD   . Celiac disease   . GERD (gastroesophageal reflux disease)   . RLS (restless legs syndrome)   . Sleep apnea syndrome    tested  under [place] [facility],  12-2-- 2004   Past Surgical History:  Procedure Laterality Date  . APPENDECTOMY    . HERNIA REPAIR     Allergies  Allergen Reactions  . Gluten Meal Other (See Comments)   Prior to Admission medications   Medication Sig Start Date End Date Taking? Authorizing Provider  amLODipine (NORVASC) 2.5 MG tablet TAKE 1 TABLET(2.5 MG) BY MOUTH DAILY 11/15/19  Yes [name], MD  amLODipine (NORVASC) 5 MG tablet TAKE 1 TABLET(5 MG) BY MOUTH  DAILY 11/15/19  Yes [name], MD  amphetamine-dextroamphetamine (ADDERALL) 10 MG tablet May take 2-3 tablets by mouth daily. 03/12/20  Yes Dohmeier, Asencion Partridge, MD  dapsone 25 MG tablet Take by mouth. 03/18/20 09/14/20 Yes [provider]  ferrous sulfate 325 (65 FE) MG tablet Take 325 mg by mouth daily. 2 times weekly   Yes [provider]  fish oil-omega-3 fatty acids 1000 MG capsule Take 2 g by mouth 2 (two) times a week.    Yes [provider]  sertraline (ZOLOFT) 50 MG tablet 1 po qd 11/15/19  Yes [name], MD  traZODone (DESYREL) 50 MG tablet Take 1 tablet (50 mg total) by mouth at bedtime as needed. for sleep 11/21/19  Yes Dohmeier, Asencion Partridge, MD   Social History   Socioeconomic History  . Marital status: Married    Spouse name: Mary  . Number of children: 3  . Years of education: Not on file  . Highest education level: Not on file  Occupational History  . Occupation: Freight forwarder  Tobacco Use  . Smoking status: Never Smoker  . Smokeless tobacco: Never Used  Substance and Sexual Activity  . Alcohol use: Yes    Alcohol/week: 7.0 standard drinks    Types: 7 Standard drinks or equivalent per week    Comment: Occassionally  . Drug use: No  . Sexual activity: Yes  Other Topics Concern  . Not on file  Social History Narrative   Patient lives at home with wife [name].    Patient has 3 children.    Patient has a BA.    Social Determinants of Health   Financial Resource Strain:   . Difficulty of Paying Living Expenses:   Food Insecurity:   . Worried About Charity fundraiser in the Last Year:   . Arboriculturist in the Last Year:   Transportation Needs:   . Film/video editor (Medical):   Marland Kitchen Lack of Transportation (Non-Medical):   Physical Activity:   . Days of Exercise per Week:   . Minutes of Exercise per Session:   Stress:   . Feeling of Stress :   Social Connections:   . Frequency of Communication with Friends and Family:   . Frequency of  Social Gatherings with Friends and Family:   . Attends Religious Services:   . Active Member of Clubs or Organizations:   . Attends Archivist Meetings:   Marland Kitchen Marital Status:   Intimate Partner Violence:   . Fear of Current or Ex-Partner:   . Emotionally Abused:   Marland Kitchen Physically Abused:   .  Sexually Abused:     Review of Systems  Constitutional: Negative for fatigue and unexpected weight change.  Eyes: Negative for visual disturbance.  Respiratory: Negative for cough, chest tightness and shortness of breath.   Cardiovascular: Negative for chest pain, palpitations and leg swelling.  Gastrointestinal: Negative for abdominal pain and blood in stool.  Neurological: Negative for dizziness, light-headedness and headaches.     Objective:   Vitals:   03/27/20 1409  BP: 131/86  Pulse: 88  Temp: 98.6 F (37 C)  SpO2: 97%  Weight: 201 lb 12.8 oz (91.5 kg)  Height: 6' (1.829 m)     Physical Exam Vitals reviewed.  Constitutional:      Appearance: He is well-developed.  HENT:     Head: Normocephalic and atraumatic.  Eyes:     Pupils: Pupils are equal, round, and reactive to light.  Neck:     Vascular: No carotid bruit or JVD.  Cardiovascular:     Rate and Rhythm: Normal rate and regular rhythm.     Heart sounds: Normal heart sounds. No murmur.  Pulmonary:     Effort: Pulmonary effort is normal.     Breath sounds: Normal breath sounds. No rales.  Musculoskeletal:     Comments: Slight warmth, minimal erythema and soft tissue swelling at right MTP, slightly into the medial foot.  No wounds.  No vascular streaks.  Minimal discomfort with movement at MTP.  Skin:    General: Skin is warm and dry.  Neurological:     Mental Status: He is alert and oriented to person, place, and time.    Wt Readings from Last 3 Encounters:  03/27/20 201 lb 12.8 oz (91.5 kg)  11/21/19 215 lb (97.5 kg)  11/15/19 216 lb (98 kg)       Assessment & Plan:  BRANDI ARMATO is a 59 y.o.  male . Gout of multiple sites, unspecified cause, unspecified chronicity - Plan: Uric acid, colchicine 0.6 MG tablet  -Check uric acid, colchicine refilled as needed.  RTC precautions if frequent use.  Okay to try a few days of NSAID for right foot symptoms which have improving.  RTC precautions if worse.  Essential hypertension - Plan: amLODipine (NORVASC) 10 MG tablet  -Borderline control, will try higher dose of amlodipine 10 mg daily with potential side effects and risks discussed.  Return to 7.5 mg if that is not tolerated.  Recheck 3 months  Screening for hyperlipidemia - Plan: Lipid panel  Reactive depression - Plan: sertraline (ZOLOFT) 50 MG tablet  -Overall stable.  Option of tapering down dose if depression symptoms are improving.  Would like to stay at same dose for now.  Some of the increased readings on his PHQ are related to attention symptoms, which has been treated with stimulant, followed by neuro.  Meds ordered this encounter  Medications  . amLODipine (NORVASC) 10 MG tablet    Sig: Take 1 tablet (10 mg total) by mouth daily.    Dispense:  90 tablet    Refill:  1  . colchicine 0.6 MG tablet    Sig: Take 2 tabs at onset of gout flare, 1 additional tab in 1 hour if needed.    Dispense:  15 tablet    Refill:  0  . sertraline (ZOLOFT) 50 MG tablet    Sig: 1 po qd    Dispense:  90 tablet    Refill:  1   Patient Instructions    Increase amlodipine to 8m pill once  per day. If any new side effects, let me know.   Based on your description I suspect you did have gout flares in your feet and knee.  I am glad to hear the right foot is improving.  Okay to try Advil or Aleve temporarily for up to a week if that is improving symptoms.  If not improving or any worsening, return for recheck right away.  Colchicine prescribed for next gout flare if needed, 2 pills initially with 1 pill an hour later if still symptomatic.  No change in Zoloft for now.  Lab only visit within the  next 1 week to check a gout test and cholesterol levels.  Recheck in 3 months.  Let me know if there are questions sooner and take care  If you have lab work done today you will be contacted with your lab results within the next 2 weeks.  If you have not heard from [place] then please contact us. The fastest way to get your results is to register for My Chart.   IF you received an x-ray today, you will receive an invoice from [facility]. Please contact [facility] at [phone] with questions or concerns regarding your invoice.   IF you received labwork today, you will receive an invoice from [facility]. Please contact [facility] at [phone] with questions or concerns regarding your invoice.   Our billing staff will not be able to assist you with questions regarding bills from these companies.  You will be contacted with the lab results as soon as they are available. The fastest way to get your results is to activate your My Chart account. Instructions are located on the last page of this paperwork. If you have not heard from [place] regarding the results in 2 weeks, please contact this office.         Signed, Merri Ray, MD [facility]

## 2020-04-02 ENCOUNTER — Encounter: Payer: Self-pay | Admitting: Neurology

## 2020-04-16 ENCOUNTER — Other Ambulatory Visit: Payer: Self-pay | Admitting: Neurology

## 2020-04-16 MED ORDER — AMPHETAMINE-DEXTROAMPHETAMINE 10 MG PO TABS: ORAL_TABLET | ORAL | 0 refills | Status: DC

## 2020-04-16 NOTE — Telephone Encounter (Signed)
[place] drug registry has been verified. Last refill of Adderall 10 mg was 03/12/2020 # 90 for a 30 day supply.  [name] is out of the office this afternoon. Will fwd to workin in MD to refill if appropriate.

## 2020-04-16 NOTE — Telephone Encounter (Signed)
Pt called needing a refill on his amphetamine-dextroamphetamine (ADDERALL) 10 MG tablet sent to the [facility] on [address]

## 2020-04-30 ENCOUNTER — Encounter: Payer: Self-pay | Admitting: Neurology

## 2020-04-30 ENCOUNTER — Other Ambulatory Visit: Payer: Self-pay

## 2020-04-30 ENCOUNTER — Ambulatory Visit (INDEPENDENT_AMBULATORY_CARE_PROVIDER_SITE_OTHER): Payer: 59 | Admitting: Neurology

## 2020-04-30 VITALS — BP 140/89 | HR 57 | Ht 72.0 in | Wt 205.0 lb

## 2020-04-30 DIAGNOSIS — F901 Attention-deficit hyperactivity disorder, predominantly hyperactive type: Secondary | ICD-10-CM | POA: Diagnosis not present

## 2020-04-30 DIAGNOSIS — G2581 Restless legs syndrome: Secondary | ICD-10-CM | POA: Diagnosis not present

## 2020-04-30 MED ORDER — LISDEXAMFETAMINE DIMESYLATE 20 MG PO CAPS: 20.0000 mg | ORAL_CAPSULE | Freq: Every day | ORAL | 0 refills | Status: DC

## 2020-04-30 NOTE — Patient Instructions (Signed)
Lisdexamfetamine Oral Capsule What is this medicine? LISDEXAMFETAMINE (lis DEX am fet a meen) is used to treat attention-deficit hyperactivity disorder (ADHD) in adults and children. It is also used to treat binge-eating disorder in adults. Federal law prohibits giving this medicine to any person other than the person for whom it was prescribed. Do not share this medicine with anyone else. This medicine may be used for other purposes; ask your health care provider or pharmacist if you have questions. COMMON BRAND NAME(S): Vyvanse What should I tell my health care provider before I take this medicine? They need to know if you have any of these conditions:  anxiety or panic attacks  circulation problems in fingers and toes  glaucoma  hardening or blockages of the arteries or heart blood vessels  heart disease or a heart defect  high blood pressure  history of a drug or alcohol abuse problem  history of stroke  kidney disease  liver disease  mental illness  seizures  suicidal thoughts, plans, or attempt; a previous suicide attempt by you or a family member  thyroid disease  Tourette's syndrome  an unusual or allergic reaction to lisdexamfetamine, other medicines, foods, dyes, or preservatives  pregnant or trying to get pregnant  breast-feeding How should I use this medicine? Take this medicine by mouth. Follow the directions on the prescription label. Swallow the capsules with a drink of water. You may open capsule and add to a glass of water, then drink right away. Take your doses at regular intervals. Do not take your medicine more often than directed. Do not suddenly stop your medicine. You must gradually reduce the dose or you may feel withdrawal effects. Ask your doctor or health care professional for advice. A special MedGuide will be given to you by the pharmacist with each prescription and refill. Be sure to read this information carefully each time. Talk to your  pediatrician regarding the use of this medicine in children. While this drug may be prescribed for children as young as 54 years of age for selected conditions, precautions do apply. Overdosage: If you think you have taken too much of this medicine contact a poison control center or emergency room at once. NOTE: This medicine is only for you. Do not share this medicine with others. What if I miss a dose? If you miss a dose, take it as soon as you can. If it is almost time for your next dose, take only that dose. Do not take double or extra doses. What may interact with this medicine? Do not take this medicine with any of the following medications:  MAOIs like Carbex, Eldepryl, Marplan, Nardil, and Parnate  other stimulant medicines for attention disorders, weight loss, or to stay awake This medicine may also interact with the following medications:  acetazolamide  ammonium chloride  antacids  ascorbic acid  atomoxetine  caffeine  certain medicines for blood pressure  certain medicines for depression, anxiety, or psychotic disturbances  certain medicines for seizures like carbamazepine, phenobarbital, phenytoin  certain medicines for stomach problems like cimetidine, famotidine, omeprazole, lansoprazole  cold or allergy medicines  green tea  levodopa  linezolid  medicines for sleep during surgery  methenamine  norepinephrine  phenothiazines like chlorpromazine, mesoridazine, prochlorperazine, thioridazine  propoxyphene  sodium acid phosphate  sodium bicarbonate This list may not describe all possible interactions. Give your health care provider a list of all the medicines, herbs, non-prescription drugs, or dietary supplements you use. Also tell them if you smoke, drink alcohol,  or use illegal drugs. Some items may interact with your medicine. What should I watch for while using this medicine? Visit your doctor for regular check ups. This prescription requires  that you follow special procedures with your doctor and pharmacy. You will need to have a new written prescription from your doctor every time you need a refill. This medicine may affect your concentration, or hide signs of tiredness. Until you know how this medicine affects you, do not drive, ride a bicycle, use machinery, or do anything that needs mental alertness. Tell your doctor or health care professional if this medicine loses its effects, or if you feel you need to take more than the prescribed amount. Do not change your dose without talking to your doctor or health care professional. Decreased appetite is a common side effect when starting this medicine. Eating small, frequent meals or snacks can help. Talk to your doctor if you continue to have poor eating habits. Height and weight growth of a child taking this medicine will be monitored closely. Do not take this medicine close to bedtime. It may prevent you from sleeping. If you are going to need surgery, a MRI, CT scan, or other procedure, tell your doctor that you are taking this medicine. You may need to stop taking this medicine before the procedure. Tell your doctor or healthcare professional right away if you notice unexplained wounds on your fingers and toes while taking this medicine. You should also tell your healthcare provider if you experience numbness or pain, changes in the skin color, or sensitivity to temperature in your fingers or toes. What side effects may I notice from receiving this medicine? Side effects that you should report to your doctor or health care professional as soon as possible:  allergic reactions like skin rash, itching or hives, swelling of the face, lips, or tongue  changes in vision  chest pain or chest tightness  confusion, trouble speaking or understanding  fast, irregular heartbeat  fingers or toes feel numb, cool, painful  hallucination, loss of contact with reality  high blood  pressure  males: prolonged or painful erection  seizures  severe headaches  shortness of breath  suicidal thoughts or other mood changes  trouble walking, dizziness, loss of balance or coordination  uncontrollable head, mouth, neck, arm, or leg movements Side effects that usually do not require medical attention (report to your doctor or health care professional if they continue or are bothersome):  anxious  headache  loss of appetite  nausea, vomiting  trouble sleeping  weight loss This list may not describe all possible side effects. Call your doctor for medical advice about side effects. You may report side effects to FDA at 1-800-FDA-1088. Where should I keep my medicine? Keep out of the reach of children. This medicine can be abused. Keep your medicine in a safe place to protect it from theft. Do not share this medicine with anyone. Selling or giving away this medicine is dangerous and against the law. Store at room temperature between 15 and 30 degrees C (59 and 86 degrees F). Protect from light. Keep container tightly closed. Throw away any unused medicine after the expiration date. NOTE: This sheet is a summary. It may not cover all possible information. If you have questions about this medicine, talk to your doctor, pharmacist, or health care provider.  2020 Elsevier/Gold Standard (2014-08-28 19:20:14)

## 2020-04-30 NOTE — Progress Notes (Signed)
SLEEP MEDICINE CLINIC   Provider:  Larey Seat, M D  Referring Provider: [name], MD Primary Care Physician:  [name], MD  Chief Complaint  Patient presents with   Follow-up    pt alone rm 11. pt complains that he is not sleeping well. he has noticed more recently that with ADHA meds, when he takes it, he is becoming more sleepy during the day     Aaron [name] is a 59 y.o. male, seen here as a revisit for RLS He also has a new concern about circadian rhythym changes and ADHD.  The patient reports that he has always had problems to sleep well and continuously for a long time but he has begun to push his bedtime more more into the morning hours and accordingly his circadian rhythm has shifted.  Since he is also treated for ADHD and restless legs there may be some medication side effects that also influence this pattern his daytime sleepiness was endorsed on the Epworth scale at 12 points he feels subjectively that his restless legs have gotten better but that he also goes to bed later because he feels he has the highest level of productivity around midnight and later.  He is also a Physicist, medical and so when he reaches a certain workflow he likes to " ride the wave '. He naps in daytime.  He remains physically active. It ADHD got worse in his 74s.     I have the pleasure of meeting with Aaron [name] today in a yearly revisit, he has followed in this office for restless legs, has participated in iron IV trial and has also improved insomnia on trazodone.  He has struggled with insomnia just as have his parents.  Restless legs also seem to be an inherited trait.  Since the IV iron was applied his restless legs have significantly improved but not completely alleviated but his intensity of the urge to move insistence and the consistency has been much reduced.  He endorses the Epworth Sleepiness Scale at 12 out of 24 possible points, last year at this time he endorses at 16  points.  He continues medication for ADHD adult type /hyperactivity type.    Following up on a sleep study from 2014 , diagnosed with RLS, insomnia- I have the pleasure of seeing Aaron [name] today on 08/22/2017. He had been an enrolled patient and her restless leg trial which finished December 2017. He was given intravenous iron at the time. He felt that he responded beneficial. He now presents with excessive daytime sleepiness, resurgence of restless legs, but also not as bad as it used to be. He does have problems with waking up in the middle of the night not always sure if it's PLMS restless legs or other symptoms. Due to an underlying condition of ADHD he is also treated with stimulants which affect his sleep pattern. There also contribute somewhat to restless legs. He endorsed today the Epworth sleepiness score at 16 points, which is an elevated level. He would like to be further evaluated for the possible gene variant p tyr 362 , a genetic variant of BHL HE 41G. Aaron [name] only light about 10 years ago but his symptoms qualify as restless legs, both of his parents had difficulty sleeping through the night and struggled with insomnia. He has had difficulty sleeping through the night and wakes up between 3 and 5 AM on most days. Gabapentin and iron IV  helped, trazodone helped.  HPI: I have personally last seen Aaron [name] in 2014 after a diagnosed him with restless leg syndrome related insomnia and had explained to him that his apnea was so mild that CPAP intervention was not justified. In the meantime he has followed up for his celiac disease with a specialist at [facility] in [place], Dr. Marissa Nestle.  He has a family history of psoriasis, he was only diagnosed with celiac disease at age 29. He is unaware of a family history of celiac, Crohn's or any colitis. He is concerned about malabsorption. He used to take po Iron, but stopped after reading " it's bad for older folks" .  His son was  diagnosed with IBS, negative celiac testing. He has sleeping problems, too.   Sleep habits are as follows:His usual bedtime is around midnight, he only sleeps about 2-3 hours on block and then wakes up. His restless legs do no longer bother him that much that he couldn't initiate sleep. He does wake up in the middle of the night however with kicking and he is aware of his mild sleep apnea. Usually he tries to distract himself a little bit watches TV or reads until he is able to fall asleep again. He will get another 2 or 3 hours of sleep and usually sleeps better in the morning hours. He uses Gabapentin for RLS, 400 mg qhs.   Sleep medical history and family sleep history: son has insomnia.  Social history: Patient is not a smoker no tobacco use in any form, 2 mixed drinks a day on average, he drinks coffee only in the morning 2-3 cups a day. No soda, tea is caffeine free  Review of Systems: Out of a complete 14 system review, the patient complains of only the following symptoms, and all other reviewed systems are negative. Hyperactivity, better focus on ritalin.  Did not tolerate adderall.   Epworth score 12/ 24   How likely are you to doze in the following situations: 0 = not likely, 1 = slight chance, 2 = moderate chance, 3 = high chance  Sitting and Reading? Watching Television? Sitting inactive in a public place (theater or meeting)? Lying down in the afternoon when circumstances permit? Sitting and talking to someone? Sitting quietly after lunch without alcohol? In a car, while stopped for a few minutes in traffic? As a passenger in a car for an hour without a break?  Total = 12   , Fatigue severity score 40  , depression score 2, endorses an irresistible urge to move in the late afternoon and evening hours but more so if he was physically very active in daytime before.  [facility] restless leg syndrome quality of life questionnaire:  the patient endorsed a moderate- mild  impairment over the last 12 month - his restless legs doesn't do not keep him from attending social activities,  but they trouble him more concentrated in the afternoon and the evening and sometimes also affect his ability to get up in the morning.   Social History   Socioeconomic History   Marital status: Married    Spouse name: Lubeck   Number of children: 3   Years of education: Not on file   Highest education level: Not on file  Occupational History   Occupation: Freight forwarder  Tobacco Use   Smoking status: Never Smoker   Smokeless tobacco: Never Used  Substance and Sexual Activity   Alcohol use: Yes    Alcohol/week: 7.0 standard drinks    Types: 7  Standard drinks or equivalent per week    Comment: Occassionally   Drug use: No   Sexual activity: Yes  Other Topics Concern   Not on file  Social History Narrative   Patient lives at home with wife [name].    Patient has 3 children.    Patient has a BA.    Social Determinants of Health   Financial Resource Strain:    Difficulty of Paying Living Expenses:   Food Insecurity:    Worried About Charity fundraiser in the Last Year:    Arboriculturist in the Last Year:   Transportation Needs:    Film/video editor (Medical):    Lack of Transportation (Non-Medical):   Physical Activity:    Days of Exercise per Week:    Minutes of Exercise per Session:   Stress:    Feeling of Stress :   Social Connections:    Frequency of Communication with Friends and Family:    Frequency of Social Gatherings with Friends and Family:    Attends Religious Services:    Active Member of Clubs or Organizations:    Attends Music therapist:    Marital Status:   Intimate Partner Violence:    Fear of Current or Ex-Partner:    Emotionally Abused:    Physically Abused:    Sexually Abused:     Family History  Problem Relation Age of Onset   Hypertension Mother    Diabetes Mother    Hypertension  Father    Heart disease Maternal Grandmother    Heart disease Maternal Grandfather    Heart disease Paternal Grandfather    Colon cancer Neg Hx    Colon polyps Neg Hx    Kidney disease Neg Hx    Esophageal cancer Neg Hx     Past Medical History:  Diagnosis Date   ADHD    Celiac disease    GERD (gastroesophageal reflux disease)    RLS (restless legs syndrome)    Sleep apnea syndrome    tested  under [employer]  [facility],  12-2-- 2004    Past Surgical History:  Procedure Laterality Date   APPENDECTOMY     HERNIA REPAIR      Current Outpatient Medications  Medication Sig Dispense Refill   amLODipine (NORVASC) 10 MG tablet Take 1 tablet (10 mg total) by mouth daily. 90 tablet 1   amphetamine-dextroamphetamine (ADDERALL) 10 MG tablet May take 2-3 tablets by mouth daily. 90 tablet 0   colchicine 0.6 MG tablet Take 2 tabs at onset of gout flare, 1 additional tab in 1 hour if needed. 15 tablet 0   dapsone 25 MG tablet Take by mouth.     ferrous sulfate 325 (65 FE) MG tablet Take 325 mg by mouth daily. 2 times weekly     fish oil-omega-3 fatty acids 1000 MG capsule Take 2 g by mouth 2 (two) times a week.      sertraline (ZOLOFT) 50 MG tablet 1 po qd 90 tablet 1   traZODone (DESYREL) 50 MG tablet Take 1 tablet (50 mg total) by mouth at bedtime as needed. for sleep 90 tablet 3   No current facility-administered medications for this visit.    Allergies as of 04/30/2020 - Review Complete 04/30/2020  Allergen Reaction Noted   Gluten meal Other (See Comments) 08/25/2015    Vitals: BP 140/89    Pulse (!) 57    Ht 6' (1.829 m)    [facility]  205 lb (93 kg)    BMI 27.80 kg/m  Last Weight:  Wt Readings from Last 1 Encounters:  04/30/20 205 lb (93 kg)   NWG:NFAO mass index is 27.8 kg/m.     Last Height:   Ht Readings from Last 1 Encounters:  04/30/20 6' (1.829 m)    Physical exam:  General: The patient is awake, alert and appears not in acute distress. The  patient is visibly restless.  Head: Normocephalic, atraumatic. Neck is supple. Mallampati 3 ,  neck circumference: 15" Nasal airflow unrestricition , neither TMJ nor Retrognathia is seen.  Cardiovascular:  Regular rate and rhythm without  murmurs or carotid bruit, and without distended neck veins. Respiratory: Lungs are clear to auscultation. Skin:  Without evidence of edema, not evident is bluish discoloration of the feet- was seen a year ago.  Trunk: BMI is 29. The patient's posture is erect.  Neurologic exam : The patient is awake and alert, oriented to place and time.   Memory subjective  described as intact. Attention span & concentration ability appears limited , but he is hyperactive- restless.   Speech is fluent, without dysarthria, no dysphonia , nor aphasia.   Cranial nerves: Pupils are equal and briskly reactive to light. Facial sensation intact to fine touch. Facial motor strength is symmetric and tongue and uvula move midline. Shoulder shrug was symmetrical.  Motor exam:  Normal tone, muscle bulk and symmetric strength in all extremities. Sensory:  Fine touch, pinprick and vibration were normal. Finger-to-nose maneuver  normal without evidence of ataxia, dysmetria or tremor. Gait and station: Patient walks without assistive device and is able unassisted to climb up to the exam table. Strength within normal limits. Stance is stable and normal. Turns with 3  Steps. Romberg testing is  negative. Deep tendon reflexes: in the  upper and lower extremities are symmetric and intact.   Assessment:  After physical and neurologic examination, review of laboratory studies,  Personal review of imaging studies, reports of other /same  Imaging studies,  Results of polysomnography/ neurophysiology testing and pre-existing records as far as provided in visit., my assessment is positive for PLM and RLS, ADHD medication side effects.   1) No neuropathy but Neurontin helps still with sleep. RLS    2) RLS - irresistible urge to move. Improved  When we did the experimental treatment  clinical trial -   Aaron [name] received iron infusions during a restless leg trial here at [facility] and has felt less impairment by restless legs since the treatment. He is feeling much improved .   3) ADHD , adult form, he has continued Adderall 10 mg  - no refills today. He feels his concentration is not long improved, just a short term effect   Worked better than Ritalin or Strattera. Has not been on Vyvanse. wil start on vyvanse.   4) depression, procrastination,  insomnia, currently treated with zoloft,   And trazodone, refills from here.   5) circadian rhythm.   Rv in 12 month with Np or me.   Asencion Partridge Garek Schuneman MD  04/30/2020   CC: [name], [address] [place],  [place] [address]

## 2020-05-12 ENCOUNTER — Other Ambulatory Visit: Payer: Self-pay | Admitting: Family Medicine

## 2020-05-12 DIAGNOSIS — F329 Major depressive disorder, single episode, unspecified: Secondary | ICD-10-CM

## 2020-05-12 DIAGNOSIS — I10 Essential (primary) hypertension: Secondary | ICD-10-CM

## 2020-05-22 ENCOUNTER — Ambulatory Visit: Payer: 59 | Admitting: Neurology

## 2020-06-04 ENCOUNTER — Telehealth: Payer: Self-pay | Admitting: Neurology

## 2020-06-04 ENCOUNTER — Other Ambulatory Visit: Payer: Self-pay | Admitting: Neurology

## 2020-06-04 MED ORDER — LISDEXAMFETAMINE DIMESYLATE 20 MG PO CAPS: 20.0000 mg | ORAL_CAPSULE | Freq: Every day | ORAL | 0 refills | Status: DC

## 2020-06-04 NOTE — Telephone Encounter (Signed)
I have routed this request to Dr Brett Fairy for review. The pt is due for the medication and [place] registry was verified.

## 2020-06-04 NOTE — Telephone Encounter (Signed)
Pt is requesting a refill for lisdexamfetamine (VYVANSE) 20 MG capsule.  Pharmacy: [facility] [phone]

## 2020-06-27 ENCOUNTER — Ambulatory Visit: Payer: 59 | Admitting: Family Medicine

## 2020-07-01 ENCOUNTER — Other Ambulatory Visit: Payer: Self-pay | Admitting: Neurology

## 2020-07-01 ENCOUNTER — Telehealth: Payer: Self-pay | Admitting: Neurology

## 2020-07-01 MED ORDER — LISDEXAMFETAMINE DIMESYLATE 20 MG PO CAPS: 20.0000 mg | ORAL_CAPSULE | Freq: Every day | ORAL | 0 refills | Status: DC

## 2020-07-01 NOTE — Telephone Encounter (Signed)
Pt request refill lisdexamfetamine (VYVANSE) 20 MG capsule at [place] [place] #[id number]

## 2020-07-01 NOTE — Telephone Encounter (Signed)
I have routed this request to Dr Brett Fairy for review. The pt is due for the medication and [place] registry was verified. Patient is due 8/28, script was dated to allow the patient to fill the medication as early as 07/03/2020.

## 2020-07-03 ENCOUNTER — Other Ambulatory Visit: Payer: Self-pay

## 2020-07-03 ENCOUNTER — Ambulatory Visit (INDEPENDENT_AMBULATORY_CARE_PROVIDER_SITE_OTHER): Payer: 59 | Admitting: Family Medicine

## 2020-07-03 VITALS — BP 128/82 | HR 68 | Temp 98.7°F | Resp 15 | Ht 72.0 in | Wt 196.4 lb

## 2020-07-03 DIAGNOSIS — Z13220 Encounter for screening for lipoid disorders: Secondary | ICD-10-CM

## 2020-07-03 DIAGNOSIS — M109 Gout, unspecified: Secondary | ICD-10-CM | POA: Diagnosis not present

## 2020-07-03 DIAGNOSIS — F329 Major depressive disorder, single episode, unspecified: Secondary | ICD-10-CM | POA: Diagnosis not present

## 2020-07-03 DIAGNOSIS — I10 Essential (primary) hypertension: Secondary | ICD-10-CM

## 2020-07-03 DIAGNOSIS — Z23 Encounter for immunization: Secondary | ICD-10-CM

## 2020-07-03 MED ORDER — COLCHICINE 0.6 MG PO TABS: ORAL_TABLET | ORAL | 0 refills | Status: DC

## 2020-07-03 MED ORDER — SERTRALINE HCL 100 MG PO TABS: 100.0000 mg | ORAL_TABLET | Freq: Every day | ORAL | 1 refills | Status: DC

## 2020-07-03 MED ORDER — AMLODIPINE BESYLATE 10 MG PO TABS: 10.0000 mg | ORAL_TABLET | Freq: Every day | ORAL | 1 refills | Status: DC

## 2020-07-03 NOTE — Progress Notes (Signed)
Subjective:  Patient ID: Aaron [name], male    DOB: [birth year]  Age: 59 y.o. MRN: [id number]  CC:  Chief Complaint  Patient presents with  . Gout    pt has not had a flare since May, pt notes he has been doing well usually worst in winter months   . Depression    pt feels he is doing better pt has done well fo far through [place]   . Hyperlipidemia    pt did not return for lab work pt reports previously good numbers     HPI Aaron [name] presents for  Hypertension: norvasc 10 mg qd. No new side effects.  Home readings: up and down at [facility] BP Readings from Last 3 Encounters:  07/03/20 128/82  04/30/20 140/89  03/27/20 131/86   Lab Results  Component Value Date   CREATININE 1.11 07/03/2020   Lipid screening. No recent testing.  Ate at 3pm. Lost 9#. More active, less food during the summer.  Wt Readings from Last 3 Encounters:  07/03/20 196 lb 6.4 oz (89.1 kg)  04/30/20 205 lb (93 kg)  03/27/20 201 lb 12.8 oz (91.5 kg)     Lab Results  Component Value Date   CHOL 138 07/03/2020   HDL 47 07/03/2020   LDLCALC 74 07/03/2020   TRIG 91 07/03/2020   CHOLHDL 2.9 07/03/2020   Lab Results  Component Value Date   ALT 21 07/03/2020   AST 20 07/03/2020   ALKPHOS 139 (H) 07/03/2020   BILITOT 0.5 07/03/2020    Gout: Last flare: last spring - in May.  Usually worse in winter time.  Daily meds: none Prn med: colchicine rx in may - was not filled?  Lab Results  Component Value Date   LABURIC 6.7 07/03/2020   Planned on bloodwork after last visit. Has not obtained.   Depression: Some good days, some bad days. Just got through a divorce. More symptoms past month. Winter tends to be worse. Occasional therapist appt. No SI/HI.  On trazadone for sleep.   Depression screen [facility] 2/9 07/03/2020 07/03/2020 03/27/2020 11/15/2019 10/24/2018  Decreased Interest 0 0 1 2 0  Down, Depressed, Hopeless 1 0 0 2 0  PHQ - 2 Score 1 0 1 4 0  Altered sleeping 1 - 3 3 -  Tired, decreased  energy 2 - 3 2 -  Change in appetite 1 - 1 0 -  Feeling bad or failure about yourself  0 - 1 0 -  Trouble concentrating 0 - 3 2 -  Moving slowly or fidgety/restless 0 - 3 0 -  Suicidal thoughts 0 - 0 0 -  PHQ-9 Score 5 - 15 11 -  Difficult doing work/chores Somewhat difficult - - - -      History Patient Active Problem List   Diagnosis Date Noted  . ADHD, hyperactive-impulsive type 04/30/2020  . Circadian rhythm sleep disorder, delayed sleep phase type 11/21/2019  . Adult residual type attention deficit hyperactivity disorder (ADHD) 11/21/2019  . Galactosemia ([place]) 11/21/2019  . Dermatitis herpetiformis 09/22/2019  . ADHD   . Iron deficiency anemia 08/25/2015  . Insomnia 01/09/2015  . Sleep apnea syndrome   . RLS (restless legs syndrome)   . GERD 08/19/2009  . GASTRITIS 08/19/2009  . CELIAC SPRUE 07/23/2009  . DIARRHEA 07/02/2009  . INSOMNIA-SLEEP DISORDER-UNSPEC 06/30/2009  . ABDOMINAL PAIN OTHER SPECIFIED SITE 06/30/2009  . DEPRESSION 06/05/2008  . OBSTRUCTIVE SLEEP APNEA 06/05/2008  . RESTLESS LEGS SYNDROME 06/05/2008  Past Medical History:  Diagnosis Date  . ADHD   . Celiac disease   . GERD (gastroesophageal reflux disease)   . RLS (restless legs syndrome)   . Sleep apnea syndrome    tested  under [place] lab,  12-2-- 2004   Past Surgical History:  Procedure Laterality Date  . APPENDECTOMY    . HERNIA REPAIR     Allergies  Allergen Reactions  . Gluten Meal Other (See Comments)   Prior to Admission medications   Medication Sig Start Date End Date Taking? Authorizing Provider  amLODipine (NORVASC) 10 MG tablet Take 1 tablet (10 mg total) by mouth daily. 03/27/20  Yes [name], MD  amphetamine-dextroamphetamine (ADDERALL) 10 MG tablet May take 2-3 tablets by mouth daily. 04/16/20  Yes Marcial Pacas, MD  colchicine 0.6 MG tablet Take 2 tabs at onset of gout flare, 1 additional tab in 1 hour if needed. 03/27/20  Yes [name], MD    dapsone 25 MG tablet Take by mouth. 03/18/20 09/14/20 Yes [provider]  ferrous sulfate 325 (65 FE) MG tablet Take 325 mg by mouth daily. 2 times weekly   Yes [provider]  fish oil-omega-3 fatty acids 1000 MG capsule Take 2 g by mouth 2 (two) times a week.    Yes [provider]  lisdexamfetamine (VYVANSE) 20 MG capsule Take 1 capsule (20 mg total) by mouth daily. 07/03/20  Yes Dohmeier, Asencion Partridge, MD  sertraline (ZOLOFT) 50 MG tablet TAKE 1 TABLET BY MOUTH EVERY DAY 05/13/20  Yes [name], MD  traZODone (DESYREL) 50 MG tablet Take 1 tablet (50 mg total) by mouth at bedtime as needed. for sleep 11/21/19  Yes Dohmeier, Asencion Partridge, MD   Social History   Socioeconomic History  . Marital status: Married    Spouse name: Mary  . Number of children: 3  . Years of education: Not on file  . Highest education level: Not on file  Occupational History  . Occupation: Freight forwarder  Tobacco Use  . Smoking status: Never Smoker  . Smokeless tobacco: Never Used  Substance and Sexual Activity  . Alcohol use: Yes    Alcohol/week: 7.0 standard drinks    Types: 7 Standard drinks or equivalent per week    Comment: Occassionally  . Drug use: No  . Sexual activity: Yes  Other Topics Concern  . Not on file  Social History Narrative   Patient lives at home with wife [name].    Patient has 3 children.    Patient has a BA.    Social Determinants of Health   Financial Resource Strain:   . Difficulty of Paying Living Expenses: Not on file  Food Insecurity:   . Worried About Charity fundraiser in the Last Year: Not on file  . Ran Out of Food in the Last Year: Not on file  Transportation Needs:   . Lack of Transportation (Medical): Not on file  . Lack of Transportation (Non-Medical): Not on file  Physical Activity:   . Days of Exercise per Week: Not on file  . Minutes of Exercise per Session: Not on file  Stress:   . Feeling of Stress : Not on file  Social Connections:   .  Frequency of Communication with Friends and Family: Not on file  . Frequency of Social Gatherings with Friends and Family: Not on file  . Attends Religious Services: Not on file  . Active Member of Clubs or Organizations: Not  on file  . Attends Archivist Meetings: Not on file  . Marital Status: Not on file  Intimate Partner Violence:   . Fear of Current or Ex-Partner: Not on file  . Emotionally Abused: Not on file  . Physically Abused: Not on file  . Sexually Abused: Not on file    Review of Systems  Constitutional: Negative for fatigue and unexpected weight change.  Eyes: Negative for visual disturbance.  Respiratory: Negative for cough, chest tightness and shortness of breath.   Cardiovascular: Negative for chest pain, palpitations and leg swelling.  Gastrointestinal: Negative for abdominal pain and blood in stool.  Neurological: Negative for dizziness, light-headedness and headaches.     Objective:   Vitals:   07/03/20 1638 07/03/20 1642  BP: (!) 147/87 128/82  Pulse: 68   Resp: 15   Temp: 98.7 F (37.1 C)   TempSrc: Temporal   SpO2: 96%   Weight: 196 lb 6.4 oz (89.1 kg)   Height: 6' (1.829 m)      Physical Exam Vitals reviewed.  Constitutional:      Appearance: He is well-developed.  HENT:     Head: Normocephalic and atraumatic.  Eyes:     Pupils: Pupils are equal, round, and reactive to light.  Neck:     Vascular: No carotid bruit or JVD.  Cardiovascular:     Rate and Rhythm: Normal rate and regular rhythm.     Heart sounds: Normal heart sounds. No murmur heard.   Pulmonary:     Effort: Pulmonary effort is normal.     Breath sounds: Normal breath sounds. No rales.  Skin:    General: Skin is warm and dry.  Neurological:     Mental Status: He is alert and oriented to person, place, and time.  Psychiatric:        Mood and Affect: Mood normal.        Behavior: Behavior normal.        Assessment & Plan:  Aaron [name] is a 59 y.o.  male . Reactive depression - Plan: sertraline (ZOLOFT) 100 MG tablet  -Increased depressive symptoms, and going into the fall/winter that tend to be worse for him.  We will try higher dose of Zoloft at 100 mg daily, potential risks including signs and symptoms of serotonin syndrome discussed with use of trazodone.  Continue counseling.  Recheck 3 months.  Gout of multiple sites, unspecified cause, unspecified chronicity - Plan: colchicine 0.6 MG tablet, Uric Acid  -Stable, check uric acid, colchicine if needed for flares.  Screening for hyperlipidemia - Plan: Lipid panel, Comprehensive metabolic panel  Essential hypertension - Plan: Comprehensive metabolic panel, amLODipine (NORVASC) 10 MG tablet  -Stable with decreased weightbearing summer.  No changes, labs as above  Meds ordered this encounter  Medications  . colchicine 0.6 MG tablet    Sig: Take 2 tabs at onset of gout flare, 1 additional tab in 1 hour if needed.    Dispense:  15 tablet    Refill:  0  . sertraline (ZOLOFT) 100 MG tablet    Sig: Take 1 tablet (100 mg total) by mouth daily.    Dispense:  90 tablet    Refill:  1  . amLODipine (NORVASC) 10 MG tablet    Sig: Take 1 tablet (10 mg total) by mouth daily.    Dispense:  90 tablet    Refill:  1   Patient Instructions    Can try higher dose of sertraline for now  to see if that helps with mood symptoms.  Watch for new side effects or symptoms as we discussed that it is possible as you add that medicine to trazodone especially high doses.  If any new side effects return to the 50 mg dose.  I will let you know if there are concerns on blood work.  Continue same blood pressure medication for now, recheck 39-month.   If you do have a flare of gout, take colchicine.     If you have lab work done today you will be contacted with your lab results within the next 2 weeks.  If you have not heard from [place] please contact [place] The fastest way to get your results is to register  for My Chart.   IF you received an x-ray today, you will receive an invoice from [facility]. Please contact [facility] at [phone] questions or concerns regarding your invoice.   IF you received labwork today, you will receive an invoice from [facility] Please contact [facility] at [phone] questions or concerns regarding your invoice.   Our billing staff will not be able to assist you with questions regarding bills from these companies.  You will be contacted with the lab results as soon as they are available. The fastest way to get your results is to activate your My Chart account. Instructions are located on the last page of this paperwork. If you have not heard from [place] the results in 2 weeks, please contact this office.          Signed, JMerri Ray MD [facility]

## 2020-07-03 NOTE — Patient Instructions (Signed)
  Can try higher dose of sertraline for now to see if that helps with mood symptoms.  Watch for new side effects or symptoms as we discussed that it is possible as you add that medicine to trazodone especially high doses.  If any new side effects return to the 50 mg dose.  I will let you know if there are concerns on blood work.  Continue same blood pressure medication for now, recheck 6-month.   If you do have a flare of gout, take colchicine.     If you have lab work done today you will be contacted with your lab results within the next 2 weeks.  If you have not heard from [place] please contact [place] The fastest way to get your results is to register for My Chart.   IF you received an x-ray today, you will [facility]. Please contact [facility] at [phone] questions or concerns regarding your invoice.   IF you received labwork today, you will receive an invoice from [place] Please contact [facility] at [phone] questions or concerns regarding your invoice.   Our billing staff will not be able to assist you with questions regarding bills from these companies.  You will be contacted with the lab results as soon as they are available. The fastest way to get your results is to activate your My Chart account. Instructions are located on the last page of this paperwork. If you have not heard from [place] the results in 2 weeks, please contact this office.

## 2020-07-04 LAB — COMPREHENSIVE METABOLIC PANEL
ALT: 21 IU/L (ref 0–44)
AST: 20 IU/L (ref 0–40)
Albumin/Globulin Ratio: 1.8 (ref 1.2–2.2)
Albumin: 4.6 g/dL (ref 3.8–4.8)
Alkaline Phosphatase: 139 IU/L — ABNORMAL HIGH (ref 48–121)
BUN/Creatinine Ratio: 11 (ref 10–24)
BUN: 12 mg/dL (ref 8–27)
Bilirubin Total: 0.5 mg/dL (ref 0.0–1.2)
CO2: 28 mmol/L (ref 20–29)
Calcium: 9.6 mg/dL (ref 8.6–10.2)
Chloride: 101 mmol/L (ref 96–106)
Creatinine, Ser: 1.11 mg/dL (ref 0.76–1.27)
GFR calc Af Amer: 82 mL/min/{1.73_m2} (ref >59)
GFR calc non Af Amer: 71 mL/min/{1.73_m2} (ref >59)
Globulin, Total: 2.6 g/dL (ref 1.5–4.5)
Glucose: 86 mg/dL (ref 65–99)
Potassium: 4.6 mmol/L (ref 3.5–5.2)
Sodium: 142 mmol/L (ref 134–144)
Total Protein: 7.2 g/dL (ref 6.0–8.5)

## 2020-07-04 LAB — LIPID PANEL
Chol/HDL Ratio: 2.9 ratio (ref 0.0–5.0)
Cholesterol, Total: 138 mg/dL (ref 100–199)
HDL: 47 mg/dL (ref >39)
LDL Chol Calc (NIH): 74 mg/dL (ref 0–99)
Triglycerides: 91 mg/dL (ref 0–149)
VLDL Cholesterol Cal: 17 mg/dL (ref 5–40)

## 2020-07-04 LAB — URIC ACID: Uric Acid: 6.7 mg/dL (ref 3.8–8.4)

## 2020-07-06 ENCOUNTER — Encounter: Payer: Self-pay | Admitting: Family Medicine

## 2020-07-16 ENCOUNTER — Other Ambulatory Visit: Payer: Self-pay | Admitting: Family Medicine

## 2020-07-16 DIAGNOSIS — M109 Gout, unspecified: Secondary | ICD-10-CM

## 2020-07-16 NOTE — Telephone Encounter (Signed)
Requested Prescriptions  Pending Prescriptions Disp Refills  . MITIGARE 0.6 MG CAPS [Pharmacy Med Name: MITIGARE 0.6MG CAPSULES] 15 capsule 1    Sig: TAKE 2 CAPSULES BY MOUTH AT ONSET OF GOUT FLARE, 1 ADDITIONAL CAPSULE IN 1 HOUR IF NEEDED     Endocrinology:  Gout Agents Passed - 07/16/2020  4:56 AM      Passed - Uric Acid in normal range and within 360 days    Uric Acid  Date Value Ref Range Status  07/03/2020 6.7 3.8 - 8.4 mg/dL Final    Comment:               Therapeutic target for gout patients: <6.0         Passed - Cr in normal range and within 360 days    Creat  Date Value Ref Range Status  07/20/2013 1.16 0.50 - 1.35 mg/dL Final   Creatinine, Ser  Date Value Ref Range Status  07/03/2020 1.11 0.76 - 1.27 mg/dL Final         Passed - Valid encounter within last 12 months    Recent Outpatient Visits          1 week ago Reactive depression   [facility], Ranell Patrick, MD   3 months ago Gout of multiple sites, unspecified cause, unspecified chronicity   [facility], Ranell Patrick, MD   8 months ago Essential hypertension   [facility], Ranell Patrick, MD   1 year ago Essential hypertension   [facility], Ranell Patrick, MD   1 year ago Depression, unspecified depression type   [facility], Ranell Patrick, MD      Future Appointments            In 2 months Carlota Raspberry Ranell Patrick, MD [facility], [facility]

## 2020-08-04 ENCOUNTER — Telehealth: Payer: Self-pay | Admitting: Neurology

## 2020-08-04 ENCOUNTER — Other Ambulatory Visit: Payer: Self-pay | Admitting: Neurology

## 2020-08-04 NOTE — Telephone Encounter (Signed)
I have routed this request to Dr Brett Fairy for review. The pt is due for the medication and [place] registry was verified.

## 2020-08-04 NOTE — Telephone Encounter (Signed)
Pt called needing a refill on his lisdexamfetamine (VYVANSE) 20 MG capsule sent in to the [facility] on [address]

## 2020-08-05 MED ORDER — LISDEXAMFETAMINE DIMESYLATE 20 MG PO CAPS
20.0000 mg | ORAL_CAPSULE | Freq: Every day | ORAL | 0 refills | Status: DC
Start: 2020-08-05 — End: 2020-09-16

## 2020-08-18 ENCOUNTER — Other Ambulatory Visit: Payer: Self-pay | Admitting: Neurology

## 2020-09-16 ENCOUNTER — Other Ambulatory Visit: Payer: Self-pay | Admitting: Neurology

## 2020-09-16 MED ORDER — LISDEXAMFETAMINE DIMESYLATE 20 MG PO CAPS
20.0000 mg | ORAL_CAPSULE | Freq: Every day | ORAL | 0 refills | Status: DC
Start: 2020-09-16 — End: 2020-10-14

## 2020-09-16 NOTE — Telephone Encounter (Signed)
Pt request refill lisdexamfetamine (VYVANSE) 20 MG capsule at [facility] #[id number]

## 2020-10-09 ENCOUNTER — Ambulatory Visit: Payer: 59 | Admitting: Family Medicine

## 2020-10-14 ENCOUNTER — Telehealth: Payer: Self-pay | Admitting: Neurology

## 2020-10-14 ENCOUNTER — Other Ambulatory Visit: Payer: Self-pay | Admitting: Neurology

## 2020-10-14 MED ORDER — LISDEXAMFETAMINE DIMESYLATE 20 MG PO CAPS
20.0000 mg | ORAL_CAPSULE | Freq: Every day | ORAL | 0 refills | Status: DC
Start: 2020-10-14 — End: 2020-12-03

## 2020-10-14 NOTE — Telephone Encounter (Signed)
Patient called stating he needs a refill on Vyvanse.

## 2020-10-14 NOTE — Telephone Encounter (Signed)
I have routed this request to Dr Brett Fairy for review. The pt is due for the medication and [place] registry was verified.

## 2020-10-14 NOTE — Telephone Encounter (Signed)
I have routed this request to Dr Brett Fairy for review. The pt is due for the medication and  registry was verified.

## 2020-10-21 ENCOUNTER — Ambulatory Visit: Payer: 59 | Admitting: Adult Health

## 2020-11-06 ENCOUNTER — Other Ambulatory Visit: Payer: Self-pay | Admitting: Family Medicine

## 2020-11-06 DIAGNOSIS — I10 Essential (primary) hypertension: Secondary | ICD-10-CM

## 2020-11-07 ENCOUNTER — Other Ambulatory Visit: Payer: Self-pay | Admitting: Family Medicine

## 2020-11-07 DIAGNOSIS — F329 Major depressive disorder, single episode, unspecified: Secondary | ICD-10-CM

## 2020-11-20 ENCOUNTER — Encounter: Payer: Self-pay | Admitting: Family Medicine

## 2020-11-20 ENCOUNTER — Other Ambulatory Visit: Payer: Self-pay

## 2020-11-20 ENCOUNTER — Ambulatory Visit (INDEPENDENT_AMBULATORY_CARE_PROVIDER_SITE_OTHER): Payer: 59 | Admitting: Family Medicine

## 2020-11-20 VITALS — BP 133/88 | HR 84 | Temp 98.4°F | Ht 72.0 in | Wt 193.0 lb

## 2020-11-20 DIAGNOSIS — H9193 Unspecified hearing loss, bilateral: Secondary | ICD-10-CM

## 2020-11-20 DIAGNOSIS — L659 Nonscarring hair loss, unspecified: Secondary | ICD-10-CM

## 2020-11-20 DIAGNOSIS — F329 Major depressive disorder, single episode, unspecified: Secondary | ICD-10-CM | POA: Diagnosis not present

## 2020-11-20 DIAGNOSIS — I10 Essential (primary) hypertension: Secondary | ICD-10-CM | POA: Diagnosis not present

## 2020-11-20 DIAGNOSIS — Z1329 Encounter for screening for other suspected endocrine disorder: Secondary | ICD-10-CM

## 2020-11-20 DIAGNOSIS — M109 Gout, unspecified: Secondary | ICD-10-CM

## 2020-11-20 MED ORDER — AMLODIPINE BESYLATE 10 MG PO TABS: 10.0000 mg | ORAL_TABLET | Freq: Every day | ORAL | 1 refills | Status: DC

## 2020-11-20 MED ORDER — INDOMETHACIN 25 MG PO CAPS: 25.0000 mg | ORAL_CAPSULE | Freq: Three times a day (TID) | ORAL | 0 refills | Status: AC | PRN

## 2020-11-20 MED ORDER — SERTRALINE HCL 100 MG PO TABS
100.0000 mg | ORAL_TABLET | Freq: Every day | ORAL | 1 refills | Status: DC
Start: 2020-11-20 — End: 2021-01-01

## 2020-11-20 NOTE — Progress Notes (Signed)
Subjective:  Patient ID: Aaron [name], male    DOB: 1961/07/26  Age: 60 y.o. MRN: [id number]  CC:  Chief Complaint  Patient presents with  . Follow-up    On hypertension,hyperlipidemia, and depression/review new dose of sertraline. PT reports no issues with BP that he has noticed since last OV. Pt reports no physical symptoms of hypertension and hyperlipidemia. PT reports ups and downs with his depression, but no real changes.  . Alopecia    Pt reports noticing hair loss and thinks it may have to do with the medication he takes and would like the providers thoughts.    HPI Aaron [name] presents for   Concerns above and others below.   Hypertension: norvasc 38m qd.  Home readings: BP Readings from Last 3 Encounters:  11/20/20 133/88  07/03/20 128/82  04/30/20 140/89   Lab Results  Component Value Date   CREATININE 1.11 07/03/2020    On dapsone for skin issues with celiac disease - followed by dermatology - [facility]. Hair has started to thin more past 6-8 months. Has not discussed with his dermatologist.  Lab Results  Component Value Date   TSH 3.090 09/20/2018   Gout: Last flare: slight flare last week. Can go months without flare.  Daily meds: none  Prn med: colchicine- not covered - unable to fill.  Lab Results  Component Value Date   LABURIC 6.7 07/03/2020   Hearing difficulty For years, both sides. Hearing loss in family, gotten worse over he years, with background noise.  Had hearing test few years ago. Unknown facility. Requests referral.    Depression: Reactive depression discussed in August.  Worse symptoms during winter.  Occasional therapist appointment previously, no SI/HI at that time, was on trazodone for sleep. Zoloft increased to 100 mg daily.  Good days and bad days, in men's group, no recent therapist visit. Feels like higher dose doing ok.  Would like to stay on same dose. Some increased stressors past few weeks related to divorce. tough  with holidays.  Tough events behind him.   Sleep stable. Followed by sleep specialist - rx of vyvanse and trazadone.  Declines psychiatry eval.   [facility]/20/2020 07/03/2020 07/03/2020 03/27/2020 11/15/2019  Decreased Interest 1 0 0 1 2  Down, Depressed, Hopeless 1 1 0 0 2  PHQ - 2 Score 2 1 0 1 4  Altered sleeping 1 1 - 3 3  Tired, decreased energy 1 2 - 3 2  Change in appetite 0 1 - 1 0  Feeling bad or failure about yourself  1 0 - 1 0  Trouble concentrating 0 0 - 3 2  Moving slowly or fidgety/restless 0 0 - 3 0  Suicidal thoughts 0 0 - 0 0  PHQ-9 Score 5 5 - 15 11  Difficult doing work/chores - Somewhat difficult - - -     History Patient Active Problem List   Diagnosis Date Noted  . ADHD, hyperactive-impulsive type 04/30/2020  . Circadian rhythm sleep disorder, delayed sleep phase type 11/21/2019  . Adult residual type attention deficit hyperactivity disorder (ADHD) 11/21/2019  . Galactosemia ([place] 11/21/2019  . Dermatitis herpetiformis 09/22/2019  . ADHD   . Iron deficiency anemia 08/25/2015  . Insomnia 01/09/2015  . Sleep apnea syndrome   . RLS (restless legs syndrome)   . GERD 08/19/2009  . GASTRITIS 08/19/2009  . CELIAC SPRUE 07/23/2009  . DIARRHEA 07/02/2009  . INSOMNIA-SLEEP DISORDER-UNSPEC 06/30/2009  . ABDOMINAL PAIN OTHER SPECIFIED  SITE 06/30/2009  . DEPRESSION 06/05/2008  . OBSTRUCTIVE SLEEP APNEA 06/05/2008  . RESTLESS LEGS SYNDROME 06/05/2008   Past Medical History:  Diagnosis Date  . ADHD   . Celiac disease   . GERD (gastroesophageal reflux disease)   . RLS (restless legs syndrome)   . Sleep apnea syndrome    tested  under [place] lab,  12-2-- 2004   Past Surgical History:  Procedure Laterality Date  . APPENDECTOMY    . HERNIA REPAIR     Allergies  Allergen Reactions  . Gluten Meal Other (See Comments)   Prior to Admission medications   Medication Sig Start Date End Date Taking? Authorizing Provider  amLODipine  (NORVASC) 10 MG tablet Take 1 tablet (10 mg total) by mouth daily. 07/03/20  Yes [name], MD  dapsone 25 MG tablet Take 50 mg by mouth daily. 09/17/20  Yes [provider]  ferrous sulfate 325 (65 FE) MG tablet Take 325 mg by mouth daily. 2 times weekly   Yes [provider]  fish oil-omega-3 fatty acids 1000 MG capsule Take 2 g by mouth 2 (two) times a week.    Yes [provider]  lisdexamfetamine (VYVANSE) 20 MG capsule Take 1 capsule (20 mg total) by mouth daily. 10/14/20  Yes Dohmeier, Asencion Partridge, MD  MITIGARE 0.6 MG CAPS TAKE 2 CAPSULES BY MOUTH AT ONSET OF GOUT FLARE, 1 ADDITIONAL CAPSULE IN 1 HOUR IF NEEDED 07/16/20  Yes [name], MD  sertraline (ZOLOFT) 100 MG tablet Take 1 tablet (100 mg total) by mouth daily. 07/03/20  Yes [name], MD  traZODone (DESYREL) 50 MG tablet TAKE 1 TABLET(50 MG) BY MOUTH AT BEDTIME AS NEEDED FOR SLEEP 08/19/20  Yes Dohmeier, Asencion Partridge, MD   Social History   Socioeconomic History  . Marital status: Married    Spouse name: Aaron [name]  . Number of children: 3  . Years of education: Not on file  . Highest education level: Not on file  Occupational History  . Occupation: Freight forwarder  Tobacco Use  . Smoking status: Never Smoker  . Smokeless tobacco: Never Used  Substance and Sexual Activity  . Alcohol use: Yes    Alcohol/week: 7.0 standard drinks    Types: 7 Standard drinks or equivalent per week    Comment: Occassionally  . Drug use: No  . Sexual activity: Yes  Other Topics Concern  . Not on file  Social History Narrative   Patient lives at home with wife Aaron [name].    Patient has 3 children.    Patient has a BA.    Social Determinants of Health   Financial Resource Strain: Not on file  Food Insecurity: Not on file  Transportation Needs: Not on file  Physical Activity: Not on file  Stress: Not on file  Social Connections: Not on file  Intimate Partner Violence: Not on file    Review of Systems   Objective:    Vitals:   11/20/20 1637  BP: 133/88  Pulse: 84  Temp: 98.4 F (36.9 C)  TempSrc: Temporal  SpO2: 94%  Weight: 193 lb (87.5 kg)  Height: 6' (1.829 m)     Physical Exam Vitals reviewed.  Constitutional:      Appearance: He is well-developed and well-nourished.  HENT:     Head: Normocephalic and atraumatic.     Right Ear: There is no impacted cerumen.     Left Ear: There is no impacted cerumen.  Eyes:  Extraocular Movements: EOM normal.     Pupils: Pupils are equal, round, and reactive to light.  Neck:     Vascular: No carotid bruit or JVD.  Cardiovascular:     Rate and Rhythm: Normal rate and regular rhythm.     Heart sounds: Normal heart sounds. No murmur heard.   Pulmonary:     Effort: Pulmonary effort is normal.     Breath sounds: Normal breath sounds. No rales.  Musculoskeletal:        General: No edema.  Skin:    General: Skin is warm and dry.     Comments: Slight thinning of the hair more frontal scalp.  No specific patches of hair loss appreciated, skin of scalp without apparent rash.  Neurological:     Mental Status: He is alert and oriented to person, place, and time.  Psychiatric:        Mood and Affect: Mood and affect normal.        Assessment & Plan:  Aaron [name] is a 60 y.o. male . Reactive depression  -Overall stable with some ups and downs, some situational stressors recently but anticipates this to be improving at this time.  Will remain on same dose sertraline, recheck 6 months, option of therapy/counseling if persistent symptoms.  Gout of multiple sites, unspecified cause, unspecified chronicity - Plan: Uric Acid, indomethacin (INDOCIN) 25 MG capsule  -Few mild symptoms, borderline uric acid previously, repeat testing.  Consider allopurinol if frequent symptoms and uric acid over 6.  Indomethacin provided if needed as unable to get colchicine covered.  Potential risk of GI bleeding discussed with NSAIDs and Zoloft combination.  Use  only as needed and GI bleeding symptoms, precautions discussed.  Essential hypertension - Plan: Basic metabolic panel  -Stable, continue same regimen  Screening for thyroid disorder - Plan: TSH Hair thinning  -Advised discussion with his dermatologist.  Check TSH to rule out thyroid causes.  Possible stress component versus male pattern hair loss.  Hearing difficulty of both ears - Plan: Ambulatory referral to Audiology  -Longstanding symptoms, refer to audiology as reports progressive worsening.  Meds ordered this encounter  Medications  . indomethacin (INDOCIN) 25 MG capsule    Sig: Take 1 capsule (25 mg total) by mouth 3 (three) times daily as needed (for gout flare with food.).    Dispense:  30 capsule    Refill:  0   Patient Instructions    I would consider meeting with therapist if bad days are persisting. Ok to remain on same dose zoloft for now.   Discuss hair thinning with dermatology, but I will check a thyroid test.   I will check gout test. Indomethacin if needed for gout flare. Take with food.   I referred you for hearing test.   Return to the clinic or go to the nearest emergency room if any of your symptoms worsen or new symptoms occur.    If you have lab work done today you will be contacted with your lab results within the next 2 weeks.  If you have not heard from [place] then please contact us. The fastest way to get your results is to register for My Chart.   IF you received an x-ray today, you will receive an invoice from [facility]. Please contact [facility] at [phone] with questions or concerns regarding your invoice.   IF you received labwork today, you will receive an invoice from [place]. Please contact [facility] at [phone] with questions or  concerns regarding your invoice.   Our billing staff will not be able to assist you with questions regarding bills from these companies.  You will be contacted with the lab results as  soon as they are available. The fastest way to get your results is to activate your My Chart account. Instructions are located on the last page of this paperwork. If you have not heard from [place] regarding the results in 2 weeks, please contact this office.         Signed, Merri Ray, MD [facility]

## 2020-11-20 NOTE — Patient Instructions (Addendum)
  I would consider meeting with therapist if bad days are persisting. Ok to remain on same dose zoloft for now.   Discuss hair thinning with dermatology, but I will check a thyroid test.   I will check gout test. Indomethacin if needed for gout flare. Take with food.   I referred you for hearing test.   Return to the clinic or go to the nearest emergency room if any of your symptoms worsen or new symptoms occur.    If you have lab work done today you will be contacted with your lab results within the next 2 weeks.  If you have not heard from [place] then please contact us. The fastest way to get your results is to register for My Chart.   IF you received an x-ray today, you will receive an invoice from [facility]. Please contact [facility] at [phone] with questions or concerns regarding your invoice.   IF you received labwork today, you will receive an invoice from [place]. Please contact [facility] at [phone] with questions or concerns regarding your invoice.   Our billing staff will not be able to assist you with questions regarding bills from these companies.  You will be contacted with the lab results as soon as they are available. The fastest way to get your results is to activate your My Chart account. Instructions are located on the last page of this paperwork. If you have not heard from [place] regarding the results in 2 weeks, please contact this office.

## 2020-11-21 LAB — SPECIMEN STATUS

## 2020-11-23 LAB — BASIC METABOLIC PANEL
BUN/Creatinine Ratio: 10 (ref 10–24)
BUN: 11 mg/dL (ref 8–27)
CO2: 25 mmol/L (ref 20–29)
Calcium: 9.4 mg/dL (ref 8.6–10.2)
Chloride: 100 mmol/L (ref 96–106)
Creatinine, Ser: 1.06 mg/dL (ref 0.76–1.27)
GFR calc Af Amer: 87 mL/min/{1.73_m2} (ref >59)
GFR calc non Af Amer: 75 mL/min/{1.73_m2} (ref >59)
Glucose: 99 mg/dL (ref 65–99)
Potassium: 4.7 mmol/L (ref 3.5–5.2)
Sodium: 140 mmol/L (ref 134–144)

## 2020-11-23 LAB — URIC ACID: Uric Acid: 7.4 mg/dL (ref 3.8–8.4)

## 2020-11-23 LAB — TSH: TSH: 1.84 u[IU]/mL (ref 0.450–4.500)

## 2020-12-01 ENCOUNTER — Encounter: Payer: Self-pay | Admitting: Radiology

## 2020-12-03 ENCOUNTER — Other Ambulatory Visit: Payer: Self-pay | Admitting: Neurology

## 2020-12-03 ENCOUNTER — Telehealth: Payer: Self-pay | Admitting: Neurology

## 2020-12-03 MED ORDER — LISDEXAMFETAMINE DIMESYLATE 20 MG PO CAPS: 20.0000 mg | ORAL_CAPSULE | Freq: Every day | ORAL | 0 refills | Status: DC

## 2020-12-03 NOTE — Telephone Encounter (Signed)
Pt has called for a refill on his lisdexamfetamine (VYVANSE) 20 MG capsule to [facility] [phone]

## 2020-12-03 NOTE — Telephone Encounter (Signed)
I have routed this request to Dr Brett Fairy for review. The pt is due for the medication and [place] registry was verified.

## 2021-01-01 ENCOUNTER — Other Ambulatory Visit: Payer: Self-pay | Admitting: Family Medicine

## 2021-01-01 DIAGNOSIS — F329 Major depressive disorder, single episode, unspecified: Secondary | ICD-10-CM

## 2021-01-05 ENCOUNTER — Other Ambulatory Visit: Payer: Self-pay | Admitting: Neurology

## 2021-01-05 ENCOUNTER — Telehealth: Payer: Self-pay | Admitting: Neurology

## 2021-01-05 MED ORDER — LISDEXAMFETAMINE DIMESYLATE 20 MG PO CAPS: 20.0000 mg | ORAL_CAPSULE | Freq: Every day | ORAL | 0 refills | Status: DC

## 2021-01-05 NOTE — Telephone Encounter (Signed)
I have routed this request to Dr Brett Fairy for review. The pt is due for the medication and [place] registry was verified.

## 2021-01-05 NOTE — Telephone Encounter (Signed)
Pt has called for a refill on his lisdexamfetamine (VYVANSE) 20 MG capsule to [facility] [phone]

## 2021-01-08 ENCOUNTER — Encounter: Payer: Self-pay | Admitting: Adult Health

## 2021-01-23 ENCOUNTER — Emergency Department (HOSPITAL_COMMUNITY): Payer: 59

## 2021-01-23 ENCOUNTER — Inpatient Hospital Stay (HOSPITAL_COMMUNITY)
Admission: EM | Admit: 2021-01-23 | Discharge: 2021-01-27 | DRG: 200 | Disposition: A | Discharge status: Home Health | Payer: 59 | Attending: General Surgery | Admitting: General Surgery

## 2021-01-23 ENCOUNTER — Encounter (HOSPITAL_COMMUNITY): Payer: Self-pay | Admitting: Emergency Medicine

## 2021-01-23 ENCOUNTER — Other Ambulatory Visit: Payer: Self-pay

## 2021-01-23 DIAGNOSIS — Z79899 Other long term (current) drug therapy: Secondary | ICD-10-CM

## 2021-01-23 DIAGNOSIS — W19XXXA Unspecified fall, initial encounter: Secondary | ICD-10-CM

## 2021-01-23 DIAGNOSIS — S270XXA Traumatic pneumothorax, initial encounter: Secondary | ICD-10-CM | POA: Diagnosis not present

## 2021-01-23 DIAGNOSIS — G2581 Restless legs syndrome: Secondary | ICD-10-CM | POA: Diagnosis present

## 2021-01-23 DIAGNOSIS — R079 Chest pain, unspecified: Secondary | ICD-10-CM | POA: Diagnosis not present

## 2021-01-23 DIAGNOSIS — Z8249 Family history of ischemic heart disease and other diseases of the circulatory system: Secondary | ICD-10-CM

## 2021-01-23 DIAGNOSIS — M25569 Pain in unspecified knee: Secondary | ICD-10-CM

## 2021-01-23 DIAGNOSIS — W11XXXA Fall on and from ladder, initial encounter: Secondary | ICD-10-CM | POA: Diagnosis present

## 2021-01-23 DIAGNOSIS — J90 Pleural effusion, not elsewhere classified: Secondary | ICD-10-CM | POA: Diagnosis present

## 2021-01-23 DIAGNOSIS — G4733 Obstructive sleep apnea (adult) (pediatric): Secondary | ICD-10-CM | POA: Diagnosis present

## 2021-01-23 DIAGNOSIS — S2242XA Multiple fractures of ribs, left side, initial encounter for closed fracture: Secondary | ICD-10-CM | POA: Diagnosis present

## 2021-01-23 DIAGNOSIS — K900 Celiac disease: Secondary | ICD-10-CM | POA: Diagnosis present

## 2021-01-23 DIAGNOSIS — Y92007 Garden or yard of unspecified non-institutional (private) residence as the place of occurrence of the external cause: Secondary | ICD-10-CM

## 2021-01-23 DIAGNOSIS — K219 Gastro-esophageal reflux disease without esophagitis: Secondary | ICD-10-CM | POA: Diagnosis present

## 2021-01-23 DIAGNOSIS — F32A Depression, unspecified: Secondary | ICD-10-CM | POA: Diagnosis present

## 2021-01-23 DIAGNOSIS — G4700 Insomnia, unspecified: Secondary | ICD-10-CM | POA: Diagnosis present

## 2021-01-23 DIAGNOSIS — Z20822 Contact with and (suspected) exposure to covid-19: Secondary | ICD-10-CM | POA: Diagnosis present

## 2021-01-23 DIAGNOSIS — R0902 Hypoxemia: Secondary | ICD-10-CM

## 2021-01-23 DIAGNOSIS — S2249XA Multiple fractures of ribs, unspecified side, initial encounter for closed fracture: Secondary | ICD-10-CM

## 2021-01-23 DIAGNOSIS — J939 Pneumothorax, unspecified: Secondary | ICD-10-CM

## 2021-01-23 DIAGNOSIS — F901 Attention-deficit hyperactivity disorder, predominantly hyperactive type: Secondary | ICD-10-CM | POA: Diagnosis present

## 2021-01-23 DIAGNOSIS — M25461 Effusion, right knee: Secondary | ICD-10-CM | POA: Diagnosis present

## 2021-01-23 DIAGNOSIS — Z833 Family history of diabetes mellitus: Secondary | ICD-10-CM

## 2021-01-23 DIAGNOSIS — M25561 Pain in right knee: Secondary | ICD-10-CM | POA: Diagnosis present

## 2021-01-23 LAB — COMPREHENSIVE METABOLIC PANEL
ALT: 26 U/L (ref 0–44)
AST: 30 U/L (ref 15–41)
Albumin: 4 g/dL (ref 3.5–5.0)
Alkaline Phosphatase: 118 U/L (ref 38–126)
Anion gap: 6 (ref 5–15)
BUN: 13 mg/dL (ref 8–23)
CO2: 30 mmol/L (ref 22–32)
Calcium: 9.2 mg/dL (ref 8.9–10.3)
Chloride: 101 mmol/L (ref 98–111)
Creatinine, Ser: 1.22 mg/dL (ref 0.61–1.24)
GFR, Estimated: >60 mL/min (ref >60)
Glucose, Bld: 119 mg/dL — ABNORMAL HIGH (ref 70–99)
Potassium: 4.9 mmol/L (ref 3.5–5.1)
Sodium: 137 mmol/L (ref 135–145)
Total Bilirubin: 0.7 mg/dL (ref 0.3–1.2)
Total Protein: 6.8 g/dL (ref 6.5–8.1)

## 2021-01-23 LAB — CBC WITH DIFFERENTIAL/PLATELET
Abs Immature Granulocytes: 0.05 10*3/uL (ref 0.00–0.07)
Basophils Absolute: 0.1 10*3/uL (ref 0.0–0.1)
Basophils Relative: 1 %
Eosinophils Absolute: 0.3 10*3/uL (ref 0.0–0.5)
Eosinophils Relative: 3 %
HCT: 42.2 % (ref 39.0–52.0)
Hemoglobin: 13.7 g/dL (ref 13.0–17.0)
Immature Granulocytes: 1 %
Lymphocytes Relative: 20 %
Lymphs Abs: 1.9 10*3/uL (ref 0.7–4.0)
MCH: 32.3 pg (ref 26.0–34.0)
MCHC: 32.5 g/dL (ref 30.0–36.0)
MCV: 99.5 fL (ref 80.0–100.0)
Monocytes Absolute: 0.6 10*3/uL (ref 0.1–1.0)
Monocytes Relative: 6 %
Neutro Abs: 6.8 10*3/uL (ref 1.7–7.7)
Neutrophils Relative %: 69 %
Platelets: 281 10*3/uL (ref 150–400)
RBC: 4.24 MIL/uL (ref 4.22–5.81)
RDW: 13.3 % (ref 11.5–15.5)
WBC: 9.7 10*3/uL (ref 4.0–10.5)
nRBC: 0 % (ref 0.0–0.2)

## 2021-01-23 MED ORDER — MORPHINE SULFATE (PF) 4 MG/ML IV SOLN
4.0000 mg | Freq: Once | INTRAVENOUS | Status: AC
Start: 2021-01-23 — End: 2021-01-23
  Administered 2021-01-23: 4 mg via INTRAVENOUS
  Filled 2021-01-23: qty 1

## 2021-01-23 MED ORDER — ONDANSETRON HCL 4 MG/2ML IJ SOLN
4.0000 mg | Freq: Once | INTRAMUSCULAR | Status: AC
  Administered 2021-01-23: 4 mg via INTRAVENOUS
  Filled 2021-01-23: qty 2

## 2021-01-23 MED ORDER — SODIUM CHLORIDE 0.9 % IV BOLUS
1000.0000 mL | Freq: Once | INTRAVENOUS | Status: AC
  Administered 2021-01-23: 1000 mL via INTRAVENOUS

## 2021-01-23 MED ORDER — MORPHINE SULFATE (PF) 4 MG/ML IV SOLN
4.0000 mg | Freq: Once | INTRAVENOUS | Status: AC
Start: 2021-01-23 — End: 2021-01-24
  Administered 2021-01-24: 4 mg via INTRAVENOUS
  Filled 2021-01-23: qty 1

## 2021-01-23 NOTE — ED Provider Notes (Signed)
[facility] EMERGENCY DEPARTMENT Provider Note   CSN: [id number] Arrival date & time: 01/23/21  2048     History Chief Complaint  Patient presents with  . Fall    Aaron [name] is a 60 y.o. male here with fall.  Patient states that he was on top of the ladder and trimming bushes and cleaning gutters and the ladder was unstable and he fell and hit his back on the brick wall. He states that he has some left chest and flank pain.  He was not quite clear if he hit his head or not.  Patient was noted to be diaphoretic by EMS but no meds prior to arrival.  Patient is not on any blood thinners  The history is provided by the patient.       Past Medical History:  Diagnosis Date  . ADHD   . Celiac disease   . GERD (gastroesophageal reflux disease)   . RLS (restless legs syndrome)   . Sleep apnea syndrome    tested  under [place] lab,  12-2-- 2004    Patient Active Problem List   Diagnosis Date Noted  . ADHD, hyperactive-impulsive type 04/30/2020  . Circadian rhythm sleep disorder, delayed sleep phase type 11/21/2019  . Adult residual type attention deficit hyperactivity disorder (ADHD) 11/21/2019  . Galactosemia ([place]) 11/21/2019  . Dermatitis herpetiformis 09/22/2019  . ADHD   . Iron deficiency anemia 08/25/2015  . Insomnia 01/09/2015  . Sleep apnea syndrome   . RLS (restless legs syndrome)   . GERD 08/19/2009  . GASTRITIS 08/19/2009  . CELIAC SPRUE 07/23/2009  . DIARRHEA 07/02/2009  . INSOMNIA-SLEEP DISORDER-UNSPEC 06/30/2009  . ABDOMINAL PAIN OTHER SPECIFIED SITE 06/30/2009  . DEPRESSION 06/05/2008  . OBSTRUCTIVE SLEEP APNEA 06/05/2008  . RESTLESS LEGS SYNDROME 06/05/2008    Past Surgical History:  Procedure Laterality Date  . APPENDECTOMY    . HERNIA REPAIR         Family History  Problem Relation Age of Onset  . Hypertension Mother   . Diabetes Mother   . Hypertension Father   . Heart disease Maternal Grandmother   .  Heart disease Maternal Grandfather   . Heart disease Paternal Grandfather   . Colon cancer Neg Hx   . Colon polyps Neg Hx   . Kidney disease Neg Hx   . Esophageal cancer Neg Hx     Social History   Tobacco Use  . Smoking status: Never Smoker  . Smokeless tobacco: Never Used  Substance Use Topics  . Alcohol use: Yes    Alcohol/week: 7.0 standard drinks    Types: 7 Standard drinks or equivalent per week    Comment: Occassionally  . Drug use: No    Home Medications Prior to Admission medications   Medication Sig Start Date End Date Taking? Authorizing Provider  amLODipine (NORVASC) 10 MG tablet Take 1 tablet (10 mg total) by mouth daily. 11/20/20   [name], MD  dapsone 25 MG tablet Take 50 mg by mouth daily. 09/17/20   [provider]  ferrous sulfate 325 (65 FE) MG tablet Take 325 mg by mouth daily. 2 times weekly    [provider]  fish oil-omega-3 fatty acids 1000 MG capsule Take 2 g by mouth 2 (two) times a week.     [provider]  indomethacin (INDOCIN) 25 MG capsule Take 1 capsule (25 mg total) by mouth 3 (three) times daily as needed (for gout flare  with food.). 11/20/20   [name], MD  lisdexamfetamine (VYVANSE) 20 MG capsule Take 1 capsule (20 mg total) by mouth daily. 01/05/21   Dohmeier, Asencion Partridge, MD  MITIGARE 0.6 MG CAPS TAKE 2 CAPSULES BY MOUTH AT ONSET OF GOUT FLARE, 1 ADDITIONAL CAPSULE IN 1 HOUR IF NEEDED 07/16/20   [name], MD  sertraline (ZOLOFT) 100 MG tablet TAKE 1 TABLET(100 MG) BY MOUTH DAILY 01/01/21   [name], MD  traZODone (DESYREL) 50 MG tablet TAKE 1 TABLET(50 MG) BY MOUTH AT BEDTIME AS NEEDED FOR SLEEP 08/19/20   Dohmeier, Asencion Partridge, MD    Allergies    Gluten meal  Review of Systems   Review of Systems  Musculoskeletal: Positive for back pain.       L hip pain   All other systems reviewed and are negative.   Physical Exam Updated Vital Signs BP 140/89   Pulse 60   Temp 97.7 F (36.5 C)  (Oral)   Resp (!) 21   SpO2 96%   Physical Exam Vitals and nursing note reviewed.  Constitutional:      Comments: Uncomfortable and diaphoretic  HENT:     Head: Normocephalic.     Comments: No obvious scalp hematoma    Nose: Nose normal.     Mouth/Throat:     Mouth: Mucous membranes are moist.  Eyes:     Extraocular Movements: Extraocular movements intact.     Pupils: Pupils are equal, round, and reactive to light.  Cardiovascular:     Rate and Rhythm: Normal rate and regular rhythm.     Pulses: Normal pulses.     Heart sounds: Normal heart sounds.  Pulmonary:     Effort: Pulmonary effort is normal.     Breath sounds: Normal breath sounds.     Comments: Bruising on the left lower posterior chest wall Abdominal:     General: Abdomen is flat.     Palpations: Abdomen is soft.     Comments: Bruising on the left flank area  Musculoskeletal:     Cervical back: Normal range of motion and neck supple.     Comments: Patient has no midline tenderness.  Able to range left hip.  Abrasion to the left knee   Skin:    General: Skin is warm.     Capillary Refill: Capillary refill takes less than 2 seconds.  Neurological:     General: No focal deficit present.     Mental Status: He is oriented to person, place, and time.  Psychiatric:        Mood and Affect: Mood normal.        Behavior: Behavior normal.     ED Results / Procedures / Treatments   Labs (all labs ordered are listed, but only abnormal results are displayed) Labs Reviewed  CBC WITH DIFFERENTIAL/PLATELET  COMPREHENSIVE METABOLIC PANEL  I-STAT CHEM 8, ED    EKG None  Radiology No results found.  Procedures Procedures   Medications Ordered in ED Medications  morphine 4 MG/ML injection 4 mg (4 mg Intravenous Given 01/23/21 2127)  sodium chloride 0.9 % bolus 1,000 mL (1,000 mLs Intravenous New Bag/Given 01/23/21 2130)  ondansetron (ZOFRAN) injection 4 mg (4 mg Intravenous Given 01/23/21 2125)    ED Course  I  have reviewed the triage vital signs and the nursing notes.  Pertinent labs & imaging results that were available during my care of the patient were reviewed by me and considered in my medical decision  making (see chart for details).    MDM Rules/Calculators/A&P                         Aaron [name] is a 60 y.o. male here presenting with fall.  Patient fall out of 6 foot ladder.  Patient has bruising in the left flank and left chest area.  Will get trauma x-ray and trauma scans.   11:26 PM X-ray showed questionable left pubic ramus fracture.  Trauma scan is pending.  Signed out to Dr. Dayna Barker in the ED    Final Clinical Impression(s) / ED Diagnoses Final diagnoses:  Fall    Rx / DC Orders ED Discharge Orders    None       Drenda Freeze, MD 01/23/21 2326

## 2021-01-23 NOTE — ED Provider Notes (Addendum)
11:06 PM Assumed care from Dr. Darl Householder, please see their note for full history, physical and decision making until this point. In brief this is a 60 y.o. year old male who presented to the ED tonight with Fall     Fall. Pending pan scans and reeval.  Scans with evidence of rib fracture and small pneumothorax.  On reevaluation patient is tachypneic with pain with movement and mildly hypoxic 8788% on room air.  Placed on 2 L nasal cannula, discussed with trauma who will admit.  CRITICAL CARE Performed by: Merrily Pew Total critical care time: 35 minutes Critical care time was exclusive of separately billable procedures and treating other patients. Critical care was necessary to treat or prevent imminent or life-threatening deterioration. Critical care was time spent personally by me on the following activities: development of treatment plan with patient and/or surrogate as well as nursing, discussions with consultants, evaluation of patient's response to treatment, examination of patient, obtaining history from patient or surrogate, ordering and performing treatments and interventions, ordering and review of laboratory studies, ordering and review of radiographic studies, pulse oximetry and re-evaluation of patient's condition.   Labs, studies and imaging reviewed by myself and considered in medical decision making if ordered. Imaging interpreted by radiology.  Labs Reviewed  COMPREHENSIVE METABOLIC PANEL - Abnormal; Notable for the following components:      Result Value   Glucose, Bld 119 (*)    All other components within normal limits  CBC WITH DIFFERENTIAL/PLATELET  I-STAT CHEM 8, ED    DG Chest [facility] 1 View  Final Result    DG Pelvis Portable  Final Result    CT Head Wo Contrast    (Results Pending)  CT Cervical Spine Wo Contrast    (Results Pending)  CT CHEST ABDOMEN PELVIS W CONTRAST    (Results Pending)  CT L-SPINE NO CHARGE    (Results Pending)  DG Lumbar Spine Complete     (Results Pending)    No follow-ups on file.    Merrily Pew, MD 01/24/21 2322    Merrily Pew, MD 01/24/21 2322

## 2021-01-23 NOTE — ED Triage Notes (Signed)
Patient at home, outside trimming bushes and fell 6 ft off ladder onto a brick wall.  Patient is CAOx4, GCS 15, remembers the event.  Complaining of back pain and has abrasion across back from brick wall.  Patient also has abrasion on left knee.  No pain at the knee.  VSS.

## 2021-01-24 ENCOUNTER — Inpatient Hospital Stay (HOSPITAL_COMMUNITY): Payer: 59

## 2021-01-24 DIAGNOSIS — M25461 Effusion, right knee: Secondary | ICD-10-CM | POA: Diagnosis present

## 2021-01-24 DIAGNOSIS — J90 Pleural effusion, not elsewhere classified: Secondary | ICD-10-CM | POA: Diagnosis present

## 2021-01-24 DIAGNOSIS — F901 Attention-deficit hyperactivity disorder, predominantly hyperactive type: Secondary | ICD-10-CM | POA: Diagnosis present

## 2021-01-24 DIAGNOSIS — R079 Chest pain, unspecified: Secondary | ICD-10-CM | POA: Diagnosis present

## 2021-01-24 DIAGNOSIS — K900 Celiac disease: Secondary | ICD-10-CM | POA: Diagnosis present

## 2021-01-24 DIAGNOSIS — Z8249 Family history of ischemic heart disease and other diseases of the circulatory system: Secondary | ICD-10-CM | POA: Diagnosis not present

## 2021-01-24 DIAGNOSIS — F32A Depression, unspecified: Secondary | ICD-10-CM | POA: Diagnosis present

## 2021-01-24 DIAGNOSIS — S270XXA Traumatic pneumothorax, initial encounter: Secondary | ICD-10-CM | POA: Diagnosis present

## 2021-01-24 DIAGNOSIS — Z833 Family history of diabetes mellitus: Secondary | ICD-10-CM | POA: Diagnosis not present

## 2021-01-24 DIAGNOSIS — G4700 Insomnia, unspecified: Secondary | ICD-10-CM | POA: Diagnosis present

## 2021-01-24 DIAGNOSIS — K219 Gastro-esophageal reflux disease without esophagitis: Secondary | ICD-10-CM | POA: Diagnosis present

## 2021-01-24 DIAGNOSIS — J939 Pneumothorax, unspecified: Secondary | ICD-10-CM | POA: Diagnosis present

## 2021-01-24 DIAGNOSIS — Z20822 Contact with and (suspected) exposure to covid-19: Secondary | ICD-10-CM | POA: Diagnosis present

## 2021-01-24 DIAGNOSIS — Y92007 Garden or yard of unspecified non-institutional (private) residence as the place of occurrence of the external cause: Secondary | ICD-10-CM | POA: Diagnosis not present

## 2021-01-24 DIAGNOSIS — Z79899 Other long term (current) drug therapy: Secondary | ICD-10-CM | POA: Diagnosis not present

## 2021-01-24 DIAGNOSIS — M25561 Pain in right knee: Secondary | ICD-10-CM | POA: Diagnosis present

## 2021-01-24 DIAGNOSIS — G4733 Obstructive sleep apnea (adult) (pediatric): Secondary | ICD-10-CM | POA: Diagnosis present

## 2021-01-24 DIAGNOSIS — W11XXXA Fall on and from ladder, initial encounter: Secondary | ICD-10-CM | POA: Diagnosis present

## 2021-01-24 DIAGNOSIS — S2242XA Multiple fractures of ribs, left side, initial encounter for closed fracture: Secondary | ICD-10-CM | POA: Diagnosis present

## 2021-01-24 DIAGNOSIS — G2581 Restless legs syndrome: Secondary | ICD-10-CM | POA: Diagnosis present

## 2021-01-24 LAB — CBC
HCT: 37.9 % — ABNORMAL LOW (ref 39.0–52.0)
Hemoglobin: 12.4 g/dL — ABNORMAL LOW (ref 13.0–17.0)
MCH: 32.2 pg (ref 26.0–34.0)
MCHC: 32.7 g/dL (ref 30.0–36.0)
MCV: 98.4 fL (ref 80.0–100.0)
Platelets: 232 10*3/uL (ref 150–400)
RBC: 3.85 MIL/uL — ABNORMAL LOW (ref 4.22–5.81)
RDW: 13.4 % (ref 11.5–15.5)
WBC: 11.5 10*3/uL — ABNORMAL HIGH (ref 4.0–10.5)
nRBC: 0 % (ref 0.0–0.2)

## 2021-01-24 LAB — BASIC METABOLIC PANEL
Anion gap: 7 (ref 5–15)
BUN: 13 mg/dL (ref 8–23)
CO2: 28 mmol/L (ref 22–32)
Calcium: 8.9 mg/dL (ref 8.9–10.3)
Chloride: 104 mmol/L (ref 98–111)
Creatinine, Ser: 1.14 mg/dL (ref 0.61–1.24)
GFR, Estimated: >60 mL/min (ref >60)
Glucose, Bld: 119 mg/dL — ABNORMAL HIGH (ref 70–99)
Potassium: 4.8 mmol/L (ref 3.5–5.1)
Sodium: 139 mmol/L (ref 135–145)

## 2021-01-24 LAB — HIV ANTIBODY (ROUTINE TESTING W REFLEX): HIV Screen 4th Generation wRfx: NONREACTIVE

## 2021-01-24 LAB — SARS CORONAVIRUS 2 (TAT 6-24 HRS): SARS Coronavirus 2: NEGATIVE

## 2021-01-24 MED ORDER — ONDANSETRON 4 MG PO TBDP: 4.0000 mg | ORAL_TABLET | Freq: Four times a day (QID) | ORAL | Status: DC | PRN

## 2021-01-24 MED ORDER — OXYCODONE HCL 5 MG PO TABS: 10.0000 mg | ORAL_TABLET | ORAL | Status: DC | PRN

## 2021-01-24 MED ORDER — POTASSIUM CHLORIDE IN NACL 20-0.9 MEQ/L-% IV SOLN
INTRAVENOUS | Status: DC
  Filled 2021-01-24 (×2): qty 1000

## 2021-01-24 MED ORDER — ONDANSETRON HCL 4 MG/2ML IJ SOLN
4.0000 mg | Freq: Four times a day (QID) | INTRAMUSCULAR | Status: DC | PRN
  Administered 2021-01-24 – 2021-01-25 (×2): 4 mg via INTRAVENOUS
  Filled 2021-01-24 (×2): qty 2

## 2021-01-24 MED ORDER — AMLODIPINE BESYLATE 10 MG PO TABS
10.0000 mg | ORAL_TABLET | Freq: Every day | ORAL | Status: DC
  Administered 2021-01-24 – 2021-01-27 (×4): 10 mg via ORAL
  Filled 2021-01-24: qty 2
  Filled 2021-01-24 (×3): qty 1

## 2021-01-24 MED ORDER — IOHEXOL 350 MG/ML SOLN
80.0000 mL | Freq: Once | INTRAVENOUS | Status: AC | PRN
  Administered 2021-01-24: 80 mL via INTRAVENOUS

## 2021-01-24 MED ORDER — MORPHINE SULFATE (PF) 4 MG/ML IV SOLN
4.0000 mg | Freq: Once | INTRAVENOUS | Status: AC
  Administered 2021-01-24: 4 mg via INTRAVENOUS
  Filled 2021-01-24: qty 1

## 2021-01-24 MED ORDER — ENOXAPARIN SODIUM 30 MG/0.3ML ~~LOC~~ SOLN
30.0000 mg | Freq: Two times a day (BID) | SUBCUTANEOUS | Status: DC
  Administered 2021-01-25 – 2021-01-27 (×5): 30 mg via SUBCUTANEOUS
  Filled 2021-01-24 (×5): qty 0.3

## 2021-01-24 MED ORDER — DAPSONE 25 MG PO TABS
50.0000 mg | ORAL_TABLET | Freq: Every day | ORAL | Status: DC
  Administered 2021-01-24 – 2021-01-27 (×4): 50 mg via ORAL
  Filled 2021-01-24 (×4): qty 2

## 2021-01-24 MED ORDER — OXYCODONE HCL 5 MG PO TABS
5.0000 mg | ORAL_TABLET | ORAL | Status: DC | PRN
  Administered 2021-01-24: 5 mg via ORAL
  Filled 2021-01-24 (×2): qty 1

## 2021-01-24 MED ORDER — TRAZODONE HCL 50 MG PO TABS
50.0000 mg | ORAL_TABLET | Freq: Every evening | ORAL | Status: DC | PRN
  Administered 2021-01-27: 50 mg via ORAL
  Filled 2021-01-24: qty 1

## 2021-01-24 MED ORDER — SERTRALINE HCL 100 MG PO TABS
100.0000 mg | ORAL_TABLET | Freq: Every day | ORAL | Status: DC
  Administered 2021-01-24 – 2021-01-27 (×4): 100 mg via ORAL
  Filled 2021-01-24 (×4): qty 1

## 2021-01-24 MED ORDER — LISDEXAMFETAMINE DIMESYLATE 20 MG PO CAPS
20.0000 mg | ORAL_CAPSULE | Freq: Every day | ORAL | Status: DC
  Administered 2021-01-24: 20 mg via ORAL
  Filled 2021-01-24 (×3): qty 1

## 2021-01-24 MED ORDER — MORPHINE SULFATE (PF) 2 MG/ML IV SOLN
2.0000 mg | INTRAVENOUS | Status: DC | PRN
  Administered 2021-01-24 – 2021-01-25 (×2): 2 mg via INTRAVENOUS
  Filled 2021-01-24 (×3): qty 1

## 2021-01-24 MED ORDER — MORPHINE SULFATE (PF) 4 MG/ML IV SOLN: 4.0000 mg | INTRAVENOUS | Status: DC | PRN

## 2021-01-24 MED ORDER — ACETAMINOPHEN 325 MG PO TABS: 650.0000 mg | ORAL_TABLET | ORAL | Status: DC | PRN

## 2021-01-24 NOTE — Progress Notes (Signed)
Pt experiencing new pain in right knee, lateral. Non-pitting swelling.  Back pain increased while sitting up to eat.  Pt stated he "felt like he might have a fever at 1850. Checked pt temp--98.6.  At shift change, pt stated he "felt cold" and asked for towels to cover his head and shoulders (he usually sleeps in a hoodie).  5 mg dose of oxycodone did not have any effect on pt pain level.

## 2021-01-24 NOTE — Progress Notes (Signed)
Seen and examined. Pain well controlled. Repeat CXR pending. Await PT/OT recs.  Jesusita Oka, MD General and [place]

## 2021-01-24 NOTE — H&P (Signed)
History   Aaron [name] is an 60 y.o. male.   Chief Complaint:  Chief Complaint  Patient presents with  . Fall    HPI Aaron [name] is a 60 y.o. male who presents after falling from a ladder.  He was on top of the ladder and trimming bushes and cleaning gutters and the ladder was unstable and he fell and hit his back on the brick wall. He states that he has some left chest and flank pain.  He was not quite clear if he hit his head or not.  Patient was noted to be diaphoretic by EMS but no meds prior to arrival.  Patient is not on any blood thinners  The history is provided by the patient.  Past Medical History:  Diagnosis Date  . ADHD   . Celiac disease   . GERD (gastroesophageal reflux disease)   . RLS (restless legs syndrome)   . Sleep apnea syndrome    tested  under [place] lab,  12-2-- 2004    Past Surgical History:  Procedure Laterality Date  . APPENDECTOMY    . HERNIA REPAIR      Family History  Problem Relation Age of Onset  . Hypertension Mother   . Diabetes Mother   . Hypertension Father   . Heart disease Maternal Grandmother   . Heart disease Maternal Grandfather   . Heart disease Paternal Grandfather   . Colon cancer Neg Hx   . Colon polyps Neg Hx   . Kidney disease Neg Hx   . Esophageal cancer Neg Hx    Social History:  reports that he has never smoked. He has never used smokeless tobacco. He reports current alcohol use of about 7.0 standard drinks of alcohol per week. He reports that he does not use drugs.  Allergies   Allergies  Allergen Reactions  . Gluten Meal Other (See Comments)    Home Medications   Prior to Admission medications   Medication Sig Start Date End Date Taking? Authorizing Provider  amLODipine (NORVASC) 10 MG tablet Take 1 tablet (10 mg total) by mouth daily. 11/20/20   [name], MD  dapsone 25 MG tablet Take 50 mg by mouth daily. 09/17/20   [provider]  ferrous sulfate 325 (65 FE) MG  tablet Take 325 mg by mouth daily. 2 times weekly    [provider]  fish oil-omega-3 fatty acids 1000 MG capsule Take 2 g by mouth 2 (two) times a week.     [provider]  indomethacin (INDOCIN) 25 MG capsule Take 1 capsule (25 mg total) by mouth 3 (three) times daily as needed (for gout flare with food.). 11/20/20   [name], MD  lisdexamfetamine (VYVANSE) 20 MG capsule Take 1 capsule (20 mg total) by mouth daily. 01/05/21   Dohmeier, Asencion Partridge, MD  MITIGARE 0.6 MG CAPS TAKE 2 CAPSULES BY MOUTH AT ONSET OF GOUT FLARE, 1 ADDITIONAL CAPSULE IN 1 HOUR IF NEEDED 07/16/20   [name], MD  sertraline (ZOLOFT) 100 MG tablet TAKE 1 TABLET(100 MG) BY MOUTH DAILY 01/01/21   [name], MD  traZODone (DESYREL) 50 MG tablet TAKE 1 TABLET(50 MG) BY MOUTH AT BEDTIME AS NEEDED FOR SLEEP 08/19/20   Dohmeier, Asencion Partridge, MD     Trauma Course   Results for orders placed or performed during the hospital encounter of 01/23/21 (from the past 48 hour(s))  CBC with Differential/Platelet     Status: None  Collection Time: 01/23/21  9:12 PM  Result Value Ref Range   WBC 9.7 4.0 - 10.5 K/uL   RBC 4.24 4.22 - 5.81 MIL/uL   Hemoglobin 13.7 13.0 - 17.0 g/dL   HCT 42.2 39.0 - 52.0 %   MCV 99.5 80.0 - 100.0 fL   MCH 32.3 26.0 - 34.0 pg   MCHC 32.5 30.0 - 36.0 g/dL   RDW 13.3 11.5 - 15.5 %   Platelets 281 150 - 400 K/uL   nRBC 0.0 0.0 - 0.2 %   Neutrophils Relative % 69 %   Neutro Abs 6.8 1.7 - 7.7 K/uL   Lymphocytes Relative 20 %   Lymphs Abs 1.9 0.7 - 4.0 K/uL   Monocytes Relative 6 %   Monocytes Absolute 0.6 0.1 - 1.0 K/uL   Eosinophils Relative 3 %   Eosinophils Absolute 0.3 0.0 - 0.5 K/uL   Basophils Relative 1 %   Basophils Absolute 0.1 0.0 - 0.1 K/uL   Immature Granulocytes 1 %   Abs Immature Granulocytes 0.05 0.00 - 0.07 K/uL    Comment: Performed at [facility], [address] [place], [place] [address]  Comprehensive metabolic panel     Status: Abnormal    Collection Time: 01/23/21  9:12 PM  Result Value Ref Range   Sodium 137 135 - 145 mmol/L   Potassium 4.9 3.5 - 5.1 mmol/L   Chloride 101 98 - 111 mmol/L   CO2 30 22 - 32 mmol/L   Glucose, Bld 119 (H) 70 - 99 mg/dL    Comment: Glucose reference range applies only to samples taken after fasting for at least 8 hours.   BUN 13 8 - 23 mg/dL   Creatinine, Ser 1.22 0.61 - 1.24 mg/dL   Calcium 9.2 8.9 - 10.3 mg/dL   Total Protein 6.8 6.5 - 8.1 g/dL   Albumin 4.0 3.5 - 5.0 g/dL   AST 30 15 - 41 U/L   ALT 26 0 - 44 U/L   Alkaline Phosphatase 118 38 - 126 U/L   Total Bilirubin 0.7 0.3 - 1.2 mg/dL   GFR, Estimated >60 >60 mL/min    Comment: (NOTE) Calculated using the CKD-EPI Creatinine Equation (2021)    Anion gap 6 5 - 15    Comment: Performed at [facility] [address], [place],  [address]   DG Lumbar Spine Complete  Result Date: 01/23/2021 CLINICAL DATA:  60 year old male with fall and back pain. EXAM: LUMBAR SPINE - COMPLETE 4+ VIEW COMPARISON:  None. FINDINGS: Five lumbar type vertebra. Chronic appearing compression fracture of L1 with approximately 40% loss of vertebral body height. Correlation with clinical exam and point tenderness recommended. No acute fracture or subluxation. Multilevel degenerative changes. The visualized posterior elements are intact. The soft tissues are unremarkable. IMPRESSION: 1. No acute fracture or subluxation. 2. Chronic appearing compression fracture of L1. Electronically Signed   By: Anner Crete M.D.   On: 01/23/2021 23:15   CT Head Wo Contrast  Result Date: 01/24/2021 CLINICAL DATA:  60 year old male with head trauma. EXAM: CT HEAD WITHOUT CONTRAST CT CERVICAL SPINE WITHOUT CONTRAST TECHNIQUE: Multidetector CT imaging of the head and cervical spine was performed following the standard protocol without intravenous contrast. Multiplanar CT image reconstructions of the cervical spine were also generated. COMPARISON:  None. FINDINGS: CT  HEAD FINDINGS Brain: The ventricles and sulci appropriate size for patient's age. Mild periventricular and deep white matter chronic microvascular ischemic changes. There is no acute  intracranial hemorrhage. No mass effect or midline shift. No extra-axial fluid collection. Vascular: No hyperdense vessel or unexpected calcification. Skull: Normal. Negative for fracture or focal lesion. Sinuses/Orbits: There is diffuse mucoperiosteal thickening of paranasal sinuses. No air-fluid level. Mastoid air cells are clear. Other: None. CT CERVICAL SPINE FINDINGS Alignment: No acute subluxation. Skull base and vertebrae: No acute fracture.  Osteopenia. Soft tissues and spinal canal: No prevertebral fluid or swelling. No visible canal hematoma. Disc levels: Multilevel degenerative changes with disc space narrowing and endplate irregularity and anterior spurring. Upper chest: Not visualized. Other: None IMPRESSION: 1. No acute intracranial pathology. Mild chronic microvascular ischemic changes. 2. No acute/traumatic cervical spine pathology. Multilevel degenerative changes. Electronically Signed   By: Anner Crete M.D.   On: 01/24/2021 00:26   CT Cervical Spine Wo Contrast  Result Date: 01/24/2021 CLINICAL DATA:  60 year old male with head trauma. EXAM: CT HEAD WITHOUT CONTRAST CT CERVICAL SPINE WITHOUT CONTRAST TECHNIQUE: Multidetector CT imaging of the head and cervical spine was performed following the standard protocol without intravenous contrast. Multiplanar CT image reconstructions of the cervical spine were also generated. COMPARISON:  None. FINDINGS: CT HEAD FINDINGS Brain: The ventricles and sulci appropriate size for patient's age. Mild periventricular and deep white matter chronic microvascular ischemic changes. There is no acute intracranial hemorrhage. No mass effect or midline shift. No extra-axial fluid collection. Vascular: No hyperdense vessel or unexpected calcification. Skull: Normal. Negative for  fracture or focal lesion. Sinuses/Orbits: There is diffuse mucoperiosteal thickening of paranasal sinuses. No air-fluid level. Mastoid air cells are clear. Other: None. CT CERVICAL SPINE FINDINGS Alignment: No acute subluxation. Skull base and vertebrae: No acute fracture.  Osteopenia. Soft tissues and spinal canal: No prevertebral fluid or swelling. No visible canal hematoma. Disc levels: Multilevel degenerative changes with disc space narrowing and endplate irregularity and anterior spurring. Upper chest: Not visualized. Other: None IMPRESSION: 1. No acute intracranial pathology. Mild chronic microvascular ischemic changes. 2. No acute/traumatic cervical spine pathology. Multilevel degenerative changes. Electronically Signed   By: Anner Crete M.D.   On: 01/24/2021 00:26   DG Pelvis Portable  Result Date: 01/23/2021 CLINICAL DATA:  Fall EXAM: PORTABLE PELVIS 1-2 VIEWS COMPARISON:  None. FINDINGS: Suspicion for left superior pubic ramus fractures. This appearance could be related to the overlying sacrum and stool. Dedicated left hip series may be helpful. Mild degenerative changes in the hips bilaterally. No visible femoral fracture. SI joints symmetric and unremarkable. IMPRESSION: Questionable left superior pubic ramus fractures versus overlapping artifact. Consider dedicated left hip series for further evaluation. Electronically Signed   By: Rolm Baptise M.D.   On: 01/23/2021 22:14   CT CHEST ABDOMEN PELVIS W CONTRAST  Result Date: 01/24/2021 CLINICAL DATA:  Fall from ladder EXAM: CT CHEST, ABDOMEN, AND PELVIS WITH CONTRAST CT LUMBAR SPINE WITHOUT CONTRAST TECHNIQUE: Multidetector CT imaging of the chest, abdomen and pelvis was performed following the standard protocol during bolus administration of intravenous contrast. Multiplanar CT reconstructed images of the lumbar spine were generated from the CT imaging of the chest, abdomen and pelvis using bone and soft tissue reconstruction kernels.  CONTRAST:  34m OMNIPAQUE IOHEXOL 350 MG/ML SOLN COMPARISON:  Chest, pelvis and lumbar radiographs 01/23/2021 FINDINGS: CT CHEST FINDINGS Cardiovascular: The aortic root is suboptimally assessed given cardiac pulsation artifact. The aorta is normal caliber. No acute luminal abnormality of the imaged aorta. No periaortic stranding or hemorrhage. Normal 3 vessel branching of the aortic arch. Proximal great vessels are unremarkable. Normal heart size. No pericardial effusion. Central  pulmonary arteries are normal caliber. No large central filling defects with more distal evaluation limited by a non tailored examination. No major venous abnormalities. Mediastinum/Nodes: No mediastinal fluid or gas. Normal thyroid gland and thoracic inlet. No acute abnormality of the trachea or esophagus. No worrisome mediastinal, hilar or axillary adenopathy. Lungs/Pleura: Small to moderate left pneumothorax occupying approximately 15% of the left hemithorax volume. Some layering left pleural fluid may reflect trace hemothorax as well. Subpleural thickening in the partially atelectatic left upper lobe could reflect some pulmonary contusion or laceration. Dependent atelectasis seen posteriorly. No right pneumothorax or effusion. Additional bandlike areas of opacity in the lung bases likely atelectatic or scarring. No consolidative airspace disease or convincing features of edema at this time. Musculoskeletal: Comminuted fractures of the left tenth and eleventh ribs with associated adjacent pleural thickening, pneumothorax and effusion/hemothorax. No other visible acute traumatic osseous injury of the chest wall, thoracic spine or included osseous structures of the shoulders and upper extremities. There is soft tissue gas and thickening adjacent the left rib fractures extending along the left chest wall. CT ABDOMEN PELVIS FINDINGS Hepatobiliary: No direct hepatic injury or perihepatic hematoma. No worrisome focal liver lesions. Smooth  liver surface contour. Normal hepatic attenuation. Gallbladder contains several dependently layering calcified gallstones. No pericholecystic fluid or inflammation. No biliary ductal dilatation or intraductal gallstones. Pancreas: No direct pancreatic contusion or ductal disruption. No pancreatic ductal dilatation or surrounding inflammatory changes. Spleen: No evidence of direct splenic injury or perisplenic hematoma. Normal in size. No concerning splenic lesions. Adrenals/Urinary Tract: No adrenal hemorrhage or suspicious adrenal lesions. No direct renal injury or perinephric hemorrhage. Kidneys are normally located with symmetric enhancementand excretion without extravasation of contrast on the excretory delayed phase imaging. Few fluid attenuation cysts are seen bilaterally, largest in the right kidney measuring up to 4.2 cm in size. No suspicious renal lesion, urolithiasis or hydronephrosis. No evidence of direct bladder injury or other acute bladder abnormality. Stomach/Bowel: Distal esophagus unremarkable. No focal bowel wall thickening or dilatation. Several loops of small bowel partially protrudes into a fat and bowel containing right spigelian hernia (3/93). Some mild thickening and stranding of the fascia at this level, can be seen in the posttraumatic setting. No resulting mechanical obstruction at this level scattered colonic diverticula without focal inflammation to suggest diverticulitis. A normal appendix is visualized. No evidence of mesenteric hematoma or contusion. Vascular/Lymphatic: No direct vascular injury. No sites of active contrast extravasation. Atherosclerotic calcifications within the abdominal aorta and branch vessels. No aneurysm or ectasia. No enlarged abdominopelvic lymph nodes. Reproductive: Coarse eccentric calcification of the prostate. No concerning abnormalities of the prostate or seminal vesicles. Other: Fat and bowel containing right spigelian hernia with adjacent stranding  and thickening, bilateral fat containing inguinal hernias, left greater than right. No other bowel containing hernias. No abdominopelvic free air or fluid. Musculoskeletal: Dedicated lumbar imaging as below. Bones of the pelvis appear intact and congruent. Few benign scattered bone islands. Degenerative changes in the hips and pelvis. Partially included upper extremities within the abdominopelvic levels of imaging are unremarkable. CT LUMBAR SPINE FINDINGS Segmentation: 5 normally formed lumbar type vertebral bodies. Lowest fully formed disc space denoted as L5-S1. Alignment: Minimal retrolisthesis L1 on L2 of approximately 2 mm and L5 on S1 of approximately 3 mm is favored to be degenerative given discogenic and facet degenerative changes at these levels. No abnormally widened, jumped or perched facets. Mild dextrocurvature of the thoracolumbar spine, apex L1-2. Vertebrae: The suspected compression deformity at L1 on radiography  is likely projectional secondary to mild dextrocurvature of the spine. No acute or remote appearing compression deformities of lumbar levels. Few scattered Schmorl's node formations. Discogenic and facet degenerative changes, better detailed below. Paraspinal and other soft tissues: No paravertebral fluid, swelling, gas or hemorrhage. No visible canal hematoma. Disc levels: Level by level evaluation of the lumbar spine below: T11-T12: Disc height loss. No significant posterior disc abnormality. Minimal facet degenerative change. Minimal disc height loss without no significant spinal canal or foraminal stenosis. Significant posterior disc T12-L1: Abnormality and minimal facet degenerative change. No significant spinal canal or foraminal stenosis. L1-L2: Minimal retrolisthesis and mild global disc bulge. No significant spinal canal or foraminal stenosis. L2-L3: Shallow global disc bulge. Mild bilateral facet arthropathy. Partial effacement of the lateral recesses. No significant spinal canal  or foraminal stenosis. L3-L4: Disc height loss and bilateral facet arthropathy with only tiny central disc protrusion. No significant spinal canal or foraminal stenosis. L4-L5: Disc height loss, shallow global disc bulge with moderate bilateral facet arthropathy and ligamentum flavum thickening resulting in mild canal stenosis and bilateral foraminal narrowing with partial effacement the lateral recesses. Stenosis L5-S1: Retrolisthesis, disc height loss and global disc bulge with bilateral facet arthropathy resulting and mild canal stenosis, mild mild-to-moderate bilateral foraminal narrowing and effacement of the lateral recesses. IMPRESSION: 1. Comminuted fractures of the left tenth and eleventh ribs with associated small to moderate left pneumothorax occupying approximately 15% of the left hemithorax. Some layering left pleural fluid may reflect trace hemothorax as well. Adjacent soft tissue gas extending along the left chest wall. Subpleural thickening in the partially atelectatic left upper lobe could reflect some pulmonary contusion or laceration. 2. Fat and bowel containing right Spigelian hernia with adjacent stranding and thickening, can be seen in the posttraumatic setting. Correlate for point tenderness. No resulting mechanical obstruction at this level. 3. No other acute abdominopelvic abnormalities. 4. Cholelithiasis without evidence of acute cholecystitis. 5. Colonic diverticulosis without evidence of acute diverticulitis. 6. Suspected compression deformity at the L1 level is likely related to projection with some dextrocurvature noted on CT imaging. No acute lumbar fracture or traumatic listhesis. 7. Multilevel degenerative changes of the lumbar spine, as described above. 8. Aortic Atherosclerosis (ICD10-I70.0). These results were called by telephone at the time of interpretation on 01/24/2021 at 12:51 am to provider Mesner , who verbally acknowledged these results. Electronically Signed   By: Lovena Le M.D.   On: 01/24/2021 00:51   CT L-SPINE NO CHARGE  Result Date: 01/24/2021 CLINICAL DATA:  Fall from ladder EXAM: CT CHEST, ABDOMEN, AND PELVIS WITH CONTRAST CT LUMBAR SPINE WITHOUT CONTRAST TECHNIQUE: Multidetector CT imaging of the chest, abdomen and pelvis was performed following the standard protocol during bolus administration of intravenous contrast. Multiplanar CT reconstructed images of the lumbar spine were generated from the CT imaging of the chest, abdomen and pelvis using bone and soft tissue reconstruction kernels. CONTRAST:  79m OMNIPAQUE IOHEXOL 350 MG/ML SOLN COMPARISON:  Chest, pelvis and lumbar radiographs 01/23/2021 FINDINGS: CT CHEST FINDINGS Cardiovascular: The aortic root is suboptimally assessed given cardiac pulsation artifact. The aorta is normal caliber. No acute luminal abnormality of the imaged aorta. No periaortic stranding or hemorrhage. Normal 3 vessel branching of the aortic arch. Proximal great vessels are unremarkable. Normal heart size. No pericardial effusion. Central pulmonary arteries are normal caliber. No large central filling defects with more distal evaluation limited by a non tailored examination. No major venous abnormalities. Mediastinum/Nodes: No mediastinal fluid or gas. Normal thyroid gland and  thoracic inlet. No acute abnormality of the trachea or esophagus. No worrisome mediastinal, hilar or axillary adenopathy. Lungs/Pleura: Small to moderate left pneumothorax occupying approximately 15% of the left hemithorax volume. Some layering left pleural fluid may reflect trace hemothorax as well. Subpleural thickening in the partially atelectatic left upper lobe could reflect some pulmonary contusion or laceration. Dependent atelectasis seen posteriorly. No right pneumothorax or effusion. Additional bandlike areas of opacity in the lung bases likely atelectatic or scarring. No consolidative airspace disease or convincing features of edema at this time.  Musculoskeletal: Comminuted fractures of the left tenth and eleventh ribs with associated adjacent pleural thickening, pneumothorax and effusion/hemothorax. No other visible acute traumatic osseous injury of the chest wall, thoracic spine or included osseous structures of the shoulders and upper extremities. There is soft tissue gas and thickening adjacent the left rib fractures extending along the left chest wall. CT ABDOMEN PELVIS FINDINGS Hepatobiliary: No direct hepatic injury or perihepatic hematoma. No worrisome focal liver lesions. Smooth liver surface contour. Normal hepatic attenuation. Gallbladder contains several dependently layering calcified gallstones. No pericholecystic fluid or inflammation. No biliary ductal dilatation or intraductal gallstones. Pancreas: No direct pancreatic contusion or ductal disruption. No pancreatic ductal dilatation or surrounding inflammatory changes. Spleen: No evidence of direct splenic injury or perisplenic hematoma. Normal in size. No concerning splenic lesions. Adrenals/Urinary Tract: No adrenal hemorrhage or suspicious adrenal lesions. No direct renal injury or perinephric hemorrhage. Kidneys are normally located with symmetric enhancementand excretion without extravasation of contrast on the excretory delayed phase imaging. Few fluid attenuation cysts are seen bilaterally, largest in the right kidney measuring up to 4.2 cm in size. No suspicious renal lesion, urolithiasis or hydronephrosis. No evidence of direct bladder injury or other acute bladder abnormality. Stomach/Bowel: Distal esophagus unremarkable. No focal bowel wall thickening or dilatation. Several loops of small bowel partially protrudes into a fat and bowel containing right spigelian hernia (3/93). Some mild thickening and stranding of the fascia at this level, can be seen in the posttraumatic setting. No resulting mechanical obstruction at this level scattered colonic diverticula without focal  inflammation to suggest diverticulitis. A normal appendix is visualized. No evidence of mesenteric hematoma or contusion. Vascular/Lymphatic: No direct vascular injury. No sites of active contrast extravasation. Atherosclerotic calcifications within the abdominal aorta and branch vessels. No aneurysm or ectasia. No enlarged abdominopelvic lymph nodes. Reproductive: Coarse eccentric calcification of the prostate. No concerning abnormalities of the prostate or seminal vesicles. Other: Fat and bowel containing right spigelian hernia with adjacent stranding and thickening, bilateral fat containing inguinal hernias, left greater than right. No other bowel containing hernias. No abdominopelvic free air or fluid. Musculoskeletal: Dedicated lumbar imaging as below. Bones of the pelvis appear intact and congruent. Few benign scattered bone islands. Degenerative changes in the hips and pelvis. Partially included upper extremities within the abdominopelvic levels of imaging are unremarkable. CT LUMBAR SPINE FINDINGS Segmentation: 5 normally formed lumbar type vertebral bodies. Lowest fully formed disc space denoted as L5-S1. Alignment: Minimal retrolisthesis L1 on L2 of approximately 2 mm and L5 on S1 of approximately 3 mm is favored to be degenerative given discogenic and facet degenerative changes at these levels. No abnormally widened, jumped or perched facets. Mild dextrocurvature of the thoracolumbar spine, apex L1-2. Vertebrae: The suspected compression deformity at L1 on radiography is likely projectional secondary to mild dextrocurvature of the spine. No acute or remote appearing compression deformities of lumbar levels. Few scattered Schmorl's node formations. Discogenic and facet degenerative changes, better detailed below. Paraspinal  and other soft tissues: No paravertebral fluid, swelling, gas or hemorrhage. No visible canal hematoma. Disc levels: Level by level evaluation of the lumbar spine below: T11-T12: Disc  height loss. No significant posterior disc abnormality. Minimal facet degenerative change. Minimal disc height loss without no significant spinal canal or foraminal stenosis. Significant posterior disc T12-L1: Abnormality and minimal facet degenerative change. No significant spinal canal or foraminal stenosis. L1-L2: Minimal retrolisthesis and mild global disc bulge. No significant spinal canal or foraminal stenosis. L2-L3: Shallow global disc bulge. Mild bilateral facet arthropathy. Partial effacement of the lateral recesses. No significant spinal canal or foraminal stenosis. L3-L4: Disc height loss and bilateral facet arthropathy with only tiny central disc protrusion. No significant spinal canal or foraminal stenosis. L4-L5: Disc height loss, shallow global disc bulge with moderate bilateral facet arthropathy and ligamentum flavum thickening resulting in mild canal stenosis and bilateral foraminal narrowing with partial effacement the lateral recesses. Stenosis L5-S1: Retrolisthesis, disc height loss and global disc bulge with bilateral facet arthropathy resulting and mild canal stenosis, mild mild-to-moderate bilateral foraminal narrowing and effacement of the lateral recesses. IMPRESSION: 1. Comminuted fractures of the left tenth and eleventh ribs with associated small to moderate left pneumothorax occupying approximately 15% of the left hemithorax. Some layering left pleural fluid may reflect trace hemothorax as well. Adjacent soft tissue gas extending along the left chest wall. Subpleural thickening in the partially atelectatic left upper lobe could reflect some pulmonary contusion or laceration. 2. Fat and bowel containing right Spigelian hernia with adjacent stranding and thickening, can be seen in the posttraumatic setting. Correlate for point tenderness. No resulting mechanical obstruction at this level. 3. No other acute abdominopelvic abnormalities. 4. Cholelithiasis without evidence of acute  cholecystitis. 5. Colonic diverticulosis without evidence of acute diverticulitis. 6. Suspected compression deformity at the L1 level is likely related to projection with some dextrocurvature noted on CT imaging. No acute lumbar fracture or traumatic listhesis. 7. Multilevel degenerative changes of the lumbar spine, as described above. 8. Aortic Atherosclerosis (ICD10-I70.0). These results were called by telephone at the time of interpretation on 01/24/2021 at 12:51 am to provider Mesner , who verbally acknowledged these results. Electronically Signed   By: Lovena Le M.D.   On: 01/24/2021 00:51   DG Chest Port 1 View  Result Date: 01/23/2021 CLINICAL DATA:  Fall EXAM: PORTABLE CHEST 1 VIEW COMPARISON:  None. FINDINGS: Heart and mediastinal contours are within normal limits. No focal opacities or effusions. No acute bony abnormality. IMPRESSION: No active disease. Electronically Signed   By: Rolm Baptise M.D.   On: 01/23/2021 22:10    Review of Systems  HENT: Negative for ear discharge, ear pain, hearing loss and tinnitus.   Eyes: Negative for photophobia and pain.  Respiratory: Negative for cough and shortness of breath.   Cardiovascular: Negative for chest pain.  Gastrointestinal: Negative for abdominal pain, nausea and vomiting.  Genitourinary: Negative for dysuria, flank pain, frequency and urgency.  Musculoskeletal: Positive for arthralgias and back pain. Negative for myalgias and neck pain.  Neurological: Negative for dizziness and headaches.  Hematological: Does not bruise/bleed easily.  Psychiatric/Behavioral: The patient is not nervous/anxious.     Blood pressure 118/73, pulse (!) 59, temperature 97.7 F (36.5 C), temperature source Oral, resp. rate 20, SpO2 90 %. Physical Exam Vitals reviewed.  Constitutional:      General: He is not in acute distress.    Appearance: Normal appearance. He is well-developed. He is not diaphoretic.     Interventions: Cervical  collar and nasal  cannula in place.  HENT:     Head: Normocephalic and atraumatic. No raccoon eyes, Battle's sign, abrasion, contusion or laceration.     Right Ear: Hearing, tympanic membrane, ear canal and external ear normal. No laceration, drainage or tenderness. No foreign body. No hemotympanum. Tympanic membrane is not perforated.     Left Ear: Hearing, tympanic membrane, ear canal and external ear normal. No laceration, drainage or tenderness. No foreign body. No hemotympanum. Tympanic membrane is not perforated.     Nose: Nose normal. No nasal deformity or laceration.     Mouth/Throat:     Mouth: No lacerations.     Pharynx: Uvula midline.  Eyes:     General: Lids are normal. No scleral icterus.    Conjunctiva/sclera: Conjunctivae normal.     Pupils: Pupils are equal, round, and reactive to light.  Neck:     Thyroid: No thyromegaly.     Vascular: No carotid bruit or JVD.     Trachea: Trachea normal.  Cardiovascular:     Rate and Rhythm: Normal rate and regular rhythm.     Pulses: Normal pulses.     Heart sounds: Normal heart sounds.  Pulmonary:     Effort: Pulmonary effort is normal. No respiratory distress.     Breath sounds: Normal breath sounds.     Comments: Posterior left chest/ flank tenderness  Chest:     Chest wall: No tenderness.  Abdominal:     General: There is no distension.     Palpations: Abdomen is soft.     Tenderness: There is no abdominal tenderness. There is no guarding or rebound.     Comments: RLQ incision well-healed; no obvious hernia on examination; non-tender  Musculoskeletal:        General: No tenderness. Normal range of motion.     Cervical back: No spinous process tenderness or muscular tenderness.  Lymphadenopathy:     Cervical: No cervical adenopathy.  Skin:    General: Skin is warm and dry.  Neurological:     Mental Status: He is alert and oriented to person, place, and time.     GCS: GCS eye subscore is 4. GCS verbal subscore is 5. GCS motor subscore is  6.     Cranial Nerves: No cranial nerve deficit.     Sensory: No sensory deficit.  Psychiatric:        Speech: Speech normal.        Behavior: Behavior normal. Behavior is cooperative.     Assessment/Plan Fall from ladder 1.  Left 10-11 rib fractures 2.  Small left pneumothorax with some pleural effusion   Admit for pulmonary toilet/ pain control Repeat CXR in AM Mobilize.   Imogene Burn Verlyn Lambert 01/24/2021, 1:46 AM   Procedures

## 2021-01-24 NOTE — ED Notes (Signed)
Attempted report x1. 

## 2021-01-25 ENCOUNTER — Inpatient Hospital Stay (HOSPITAL_COMMUNITY): Payer: 59

## 2021-01-25 MED ORDER — MORPHINE SULFATE (PF) 2 MG/ML IV SOLN
2.0000 mg | INTRAVENOUS | Status: DC | PRN
Start: 2021-01-25 — End: 2021-01-27
  Administered 2021-01-25: 2 mg via INTRAVENOUS
  Filled 2021-01-25: qty 1

## 2021-01-25 MED ORDER — METHOCARBAMOL 750 MG PO TABS
750.0000 mg | ORAL_TABLET | Freq: Three times a day (TID) | ORAL | Status: DC
  Administered 2021-01-25 – 2021-01-27 (×7): 750 mg via ORAL
  Filled 2021-01-25 (×7): qty 1

## 2021-01-25 MED ORDER — ACETAMINOPHEN 500 MG PO TABS
1000.0000 mg | ORAL_TABLET | Freq: Four times a day (QID) | ORAL | Status: DC
  Administered 2021-01-25 – 2021-01-27 (×9): 1000 mg via ORAL
  Filled 2021-01-25 (×10): qty 2

## 2021-01-25 MED ORDER — TRAMADOL HCL 50 MG PO TABS
50.0000 mg | ORAL_TABLET | Freq: Four times a day (QID) | ORAL | Status: DC | PRN
  Administered 2021-01-25 – 2021-01-27 (×5): 50 mg via ORAL
  Filled 2021-01-25 (×2): qty 1
  Filled 2021-01-25: qty 2
  Filled 2021-01-25 (×2): qty 1

## 2021-01-25 MED ORDER — OXYCODONE HCL 5 MG PO TABS
5.0000 mg | ORAL_TABLET | ORAL | Status: DC | PRN
  Filled 2021-01-25: qty 2

## 2021-01-25 NOTE — Evaluation (Addendum)
Physical Therapy Evaluation Patient Details Name: Aaron [name] MRN: [id number] DOB: 05-23-1961 Today's Date: 01/25/2021   History of Present Illness  Pt admit after fall off ladder and injuring Left 10-11 rib fractures as well as small left pneumothorax with some pleural effusion.  Bil knee effusions as well with right knee swollen >left.  Of note, per X-ray there is a chronic L1 compression fx.PMH:  ADHD, GERD, Celiac disease, restless leg syndrome.  Clinical Impression  Pt admitted with above diagnosis. Pt was able to sit EOB and stand at EOB but could not advance LEs and required mod assist for all aspects of mobility. Limited due to pain in bil knees.  Also pt c/o dizziness and nausea at EOB. MD:  Right knee with significantly more swelling than left during treatment today.  Placed ice bil knees at end of session.  Depending on progress over next few treatments, may need SNF for short term rehab prior to d/c home.  Will follow acutely.  Pt currently with functional limitations due to the deficits listed below (see PT Problem List). Pt will benefit from skilled PT to increase their independence and safety with mobility to allow discharge to the venue listed below.      Follow Up Recommendations SNF (Short term [facility] if pt does not improve over next few treatments; [facility] if pt improves over next few treatments)    Equipment Recommendations  Rolling walker with 5" wheels;3in1 (PT)    Recommendations for Other Services       Precautions / Restrictions Precautions Precautions: Fall Restrictions Weight Bearing Restrictions: No      Mobility  Bed Mobility Overal bed mobility: Needs Assistance Bed Mobility: Rolling;Sidelying to Sit;Sit to Sidelying Rolling: Mod assist Sidelying to sit: Mod assist     Sit to sidelying: Mod assist General bed mobility comments: Pt needed mod assist to roll to right and then mod assist for elevation of trunk.    Transfers Overall transfer level:  Needs assistance Equipment used: Rolling walker (2 wheeled) Transfers: Sit to/from Stand Sit to Stand: Mod assist;From elevated surface         General transfer comment: Mod assist and incr time to power up with pt not able to stand fully upright due to pain in trunk as well as pain in bil knees. He was bearing weight heavily on bil UEs to off load the knees and after standing about 1 min, asked if he could sit down as he felt "awful".  Pt very fatigued at this point and dizzy.  BP without much change from supine to sit to stand.  When standing, pt could not weight shift or move LEs due to pain and as stated previously, pt with heavy lean on UEs.  Pt asked to lie back down, therefore had him scoot up in bed which he did with min assist and then he laid down with mod assist.  Ambulation/Gait             General Gait Details: unable to take steps  Stairs            Wheelchair Mobility    Modified Rankin (Stroke Patients Only)       Balance Overall balance assessment: Needs assistance Sitting-balance support: Bilateral upper extremity supported;Feet supported Sitting balance-Leahy Scale: Fair Sitting balance - Comments: can sit with UE support and without for brief periods with pt reporting pain   Standing balance support: Bilateral upper extremity supported;During functional activity Standing balance-Leahy Scale: Poor Standing balance  comment: relies on UE and external support                             Pertinent Vitals/Pain Pain Assessment: Faces Faces Pain Scale: Hurts worst Pain Location: above knees bil, back left Pain Descriptors / Indicators: Aching;Discomfort;Grimacing;Guarding Pain Intervention(s): Limited activity within patient's tolerance;Monitored during session;Repositioned;Premedicated before session;Ice applied (Morphine and tramadol)    Home Living Family/patient expects to be discharged to:: Private residence Living Arrangements:  Alone Available Help at Discharge: Friend(s);Available 24 hours/day Type of Home: House Home Access: Stairs to enter Entrance Stairs-Rails: None Entrance Stairs-Number of Steps: 3 Home Layout: Two level;Full bath on main level;Able to live on main level with bedroom/bathroom (may stay on couch, his bedroom is upstairs) Home Equipment: Crutches      Prior Function Level of Independence: Independent         Comments: retired     Engineer, [employer]   Dominant Hand: Left    Extremity/Trunk Assessment   Upper Extremity Assessment Upper Extremity Assessment: Defer to OT evaluation    Lower Extremity Assessment Lower Extremity Assessment: RLE deficits/detail;LLE deficits/detail RLE Deficits / Details: grossly 2-/5 RLE: Unable to fully assess due to pain (significant joint effusion) LLE Deficits / Details: grossly 3-/5 LLE: Unable to fully assess due to pain    Cervical / Trunk Assessment Cervical / Trunk Assessment: Kyphotic  Communication   Communication: No difficulties  Cognition Arousal/Alertness: Awake/alert Behavior During Therapy: WFL for tasks assessed/performed Overall Cognitive Status: Within Functional Limits for tasks assessed                                        General Comments General comments (skin integrity, edema, etc.): Nurse aware of bil knee effusions - placed ice on both knees    Exercises General Exercises - Lower Extremity Ankle Circles/Pumps: AROM;Both;5 reps;Supine Quad Sets: AROM;Both;5 reps;Supine Heel Slides: AAROM;Both;5 reps;Supine Straight Leg Raises: AAROM;Both;5 reps;Supine   Assessment/Plan    PT Assessment Patient needs continued PT services  PT Problem List Decreased activity tolerance;Decreased balance;Decreased strength;Decreased range of motion;Decreased mobility;Decreased safety awareness;Decreased knowledge of use of DME;Decreased knowledge of precautions;Pain       PT Treatment Interventions DME  instruction;Gait training;Functional mobility training;Stair training;Therapeutic activities;Therapeutic exercise;Balance training;Patient/family education    PT Goals (Current goals can be found in the Care Plan section)  Acute Rehab PT Goals Patient Stated Goal: to get better and walk PT Goal Formulation: With patient Time For Goal Achievement: 02/08/21 Potential to Achieve Goals: Good    Frequency Min 5X/week   Barriers to discharge Decreased caregiver support      Co-evaluation               AM-PAC PT "6 Clicks" Mobility  Outcome Measure Help needed turning from your back to your side while in a flat bed without using bedrails?: A Lot Help needed moving from lying on your back to sitting on the side of a flat bed without using bedrails?: A Lot Help needed moving to and from a bed to a chair (including a wheelchair)?: A Lot Help needed standing up from a chair using your arms (e.g., wheelchair or bedside chair)?: Total Help needed to walk in hospital room?: Total Help needed climbing 3-5 steps with a railing? : Total 6 Click Score: 9    End of Session Equipment Utilized During  Treatment: Gait belt Activity Tolerance: Patient limited by fatigue;Patient limited by pain Patient left: in bed;with call bell/phone within reach;with bed alarm set Nurse Communication: Mobility status (ice placed on knees and pt nauseated) PT Visit Diagnosis: Unsteadiness on feet (R26.81);Pain Pain - Right/Left:  (bil) Pain - part of body: Knee    Time: 5848-3507 PT Time Calculation (min) (ACUTE ONLY): 34 min   Charges:   PT Evaluation $PT Eval Moderate Complexity: 1 Mod PT Treatments $Therapeutic Activity: 8-22 mins        Benjerman Molinelli M,PT Acute Rehab Services [phone] (pager)  Alvira Philips 01/25/2021, 5:58 PM

## 2021-01-25 NOTE — Progress Notes (Addendum)
Subjective: CC: Patient reports pain over his bilateral knees and left posterior ribs. He denies other areas of pain. Has not gotten out of bed. He is tolerating a diet without abdominal pain, n/v. He is passing flatus and voiding. He notes he lives alone and does not have family in the area. He thinks he may have some friends that can help.   Objective: Vital signs in last 24 hours: Temp:  [98.1 F (36.7 C)-99.3 F (37.4 C)] 99.1 F (37.3 C) (03/20 0427) Pulse Rate:  [52-75] 72 (03/20 0427) Resp:  [15-19] 17 (03/20 0427) BP: (117-125)/(68-78) 117/69 (03/20 0427) SpO2:  [90 %-98 %] 91 % (03/20 0427) Last BM Date:  (PTA)  Intake/Output from previous day: 03/19 0701 - 03/20 0700 In: -  Out: 1500 [Urine:1500] Intake/Output this shift: No intake/output data recorded.  PE: General: pleasant, WD, male who is laying in bed in NAD HEENT: head is normocephalic, atraumatic.  Sclera are noninjected.  PERRL.  Ears and nose without any masses or lesions.  Mouth is pink and moist Heart: regular, rate, and rhythm. Palpable radial and pedal pulses bilaterally Lungs: CTA b/l,  Respiratory effort nonlabored with normal rate. Left chest wall tenderness appropriately  Abd: Soft, NT, ND, +BS, no masses, hernias, or organomegaly Skin: warm and dry with no masses, lesions, or rashes Neuro: Cranial nerves 2-12 grossly intact, sensation is normal throughout, moves all extremities but notes pain with lower extremities Psych: A&Ox3 with an appropriate affect. Msk:  RUE: No gross deformities of joints or skin. Able passive/active shoulder, elbow, wrist and hand range of motion without pain.  No tenderness over shoulder, upper arm, elbow, forearm, wrists or hand. Radial 2+.  LUE: No gross deformities of joints or skin. Able passive/active shoulder, elbow, wrist and hand range of motion without pain.  No tenderness over shoulder, upper arm, elbow, forearm, wrists or hand. Radial 2+.  RLE: No sacral  crepitus.  Negative logroll test. No tenderness over hip, upper/mid thigh,  lower leg, ankle or feet. He notes pain along the superior aspect of his knee with associated hematoma and pain with minimal rom.  No lower extremity edema.  No calf tenderness.   LLE: No sacral crepitus.  Negative logroll test. No tenderness over hip, upper/mid thigh,  lower leg, ankle or feet. He notes pain along the superior aspect of his knee with associated hematoma and pain with minimal rom.  No lower extremity edema.  No calf tenderness.     Lab Results:  Recent Labs    01/23/21 2112 01/24/21 0233  WBC 9.7 11.5*  HGB 13.7 12.4*  HCT 42.2 37.9*  PLT 281 232   BMET Recent Labs    01/23/21 2112 01/24/21 0233  NA 137 139  K 4.9 4.8  CL 101 104  CO2 30 28  GLUCOSE 119* 119*  BUN 13 13  CREATININE 1.22 1.14  CALCIUM 9.2 8.9   PT/INR No results for input(s): LABPROT, INR in the last 72 hours. CMP     Component Value Date/Time   NA 139 01/24/2021 0233   NA 140 11/20/2020 1806   K 4.8 01/24/2021 0233   CL 104 01/24/2021 0233   CO2 28 01/24/2021 0233   GLUCOSE 119 (H) 01/24/2021 0233   BUN 13 01/24/2021 0233   BUN 11 11/20/2020 1806   CREATININE 1.14 01/24/2021 0233   CREATININE 1.16 07/20/2013 1004   CALCIUM 8.9 01/24/2021 0233   PROT 6.8 01/23/2021 2112  PROT 7.2 07/03/2020 1815   ALBUMIN 4.0 01/23/2021 2112   ALBUMIN 4.6 07/03/2020 1815   AST 30 01/23/2021 2112   ALT 26 01/23/2021 2112   ALKPHOS 118 01/23/2021 2112   BILITOT 0.7 01/23/2021 2112   BILITOT 0.5 07/03/2020 1815   GFRNONAA >60 01/24/2021 0233   GFRAA 87 11/20/2020 1806   Lipase  No results found for: LIPASE     Studies/Results: DG Lumbar Spine Complete  Result Date: 01/23/2021 CLINICAL DATA:  60 year old male with fall and back pain. EXAM: LUMBAR SPINE - COMPLETE 4+ VIEW COMPARISON:  None. FINDINGS: Five lumbar type vertebra. Chronic appearing compression fracture of L1 with approximately 40% loss of vertebral body  height. Correlation with clinical exam and point tenderness recommended. No acute fracture or subluxation. Multilevel degenerative changes. The visualized posterior elements are intact. The soft tissues are unremarkable. IMPRESSION: 1. No acute fracture or subluxation. 2. Chronic appearing compression fracture of L1. Electronically Signed   By: Anner Crete M.D.   On: 01/23/2021 23:15   CT Head Wo Contrast  Result Date: 01/24/2021 CLINICAL DATA:  60 year old male with head trauma. EXAM: CT HEAD WITHOUT CONTRAST CT CERVICAL SPINE WITHOUT CONTRAST TECHNIQUE: Multidetector CT imaging of the head and cervical spine was performed following the standard protocol without intravenous contrast. Multiplanar CT image reconstructions of the cervical spine were also generated. COMPARISON:  None. FINDINGS: CT HEAD FINDINGS Brain: The ventricles and sulci appropriate size for patient's age. Mild periventricular and deep white matter chronic microvascular ischemic changes. There is no acute intracranial hemorrhage. No mass effect or midline shift. No extra-axial fluid collection. Vascular: No hyperdense vessel or unexpected calcification. Skull: Normal. Negative for fracture or focal lesion. Sinuses/Orbits: There is diffuse mucoperiosteal thickening of paranasal sinuses. No air-fluid level. Mastoid air cells are clear. Other: None. CT CERVICAL SPINE FINDINGS Alignment: No acute subluxation. Skull base and vertebrae: No acute fracture.  Osteopenia. Soft tissues and spinal canal: No prevertebral fluid or swelling. No visible canal hematoma. Disc levels: Multilevel degenerative changes with disc space narrowing and endplate irregularity and anterior spurring. Upper chest: Not visualized. Other: None IMPRESSION: 1. No acute intracranial pathology. Mild chronic microvascular ischemic changes. 2. No acute/traumatic cervical spine pathology. Multilevel degenerative changes. Electronically Signed   By: Anner Crete M.D.   On:  01/24/2021 00:26   CT Cervical Spine Wo Contrast  Result Date: 01/24/2021 CLINICAL DATA:  60 year old male with head trauma. EXAM: CT HEAD WITHOUT CONTRAST CT CERVICAL SPINE WITHOUT CONTRAST TECHNIQUE: Multidetector CT imaging of the head and cervical spine was performed following the standard protocol without intravenous contrast. Multiplanar CT image reconstructions of the cervical spine were also generated. COMPARISON:  None. FINDINGS: CT HEAD FINDINGS Brain: The ventricles and sulci appropriate size for patient's age. Mild periventricular and deep white matter chronic microvascular ischemic changes. There is no acute intracranial hemorrhage. No mass effect or midline shift. No extra-axial fluid collection. Vascular: No hyperdense vessel or unexpected calcification. Skull: Normal. Negative for fracture or focal lesion. Sinuses/Orbits: There is diffuse mucoperiosteal thickening of paranasal sinuses. No air-fluid level. Mastoid air cells are clear. Other: None. CT CERVICAL SPINE FINDINGS Alignment: No acute subluxation. Skull base and vertebrae: No acute fracture.  Osteopenia. Soft tissues and spinal canal: No prevertebral fluid or swelling. No visible canal hematoma. Disc levels: Multilevel degenerative changes with disc space narrowing and endplate irregularity and anterior spurring. Upper chest: Not visualized. Other: None IMPRESSION: 1. No acute intracranial pathology. Mild chronic microvascular ischemic changes. 2. No acute/traumatic cervical  spine pathology. Multilevel degenerative changes. Electronically Signed   By: Anner Crete M.D.   On: 01/24/2021 00:26   DG Pelvis Portable  Result Date: 01/23/2021 CLINICAL DATA:  Fall EXAM: PORTABLE PELVIS 1-2 VIEWS COMPARISON:  None. FINDINGS: Suspicion for left superior pubic ramus fractures. This appearance could be related to the overlying sacrum and stool. Dedicated left hip series may be helpful. Mild degenerative changes in the hips bilaterally. No  visible femoral fracture. SI joints symmetric and unremarkable. IMPRESSION: Questionable left superior pubic ramus fractures versus overlapping artifact. Consider dedicated left hip series for further evaluation. Electronically Signed   By: Rolm Baptise M.D.   On: 01/23/2021 22:14   CT CHEST ABDOMEN PELVIS W CONTRAST  Result Date: 01/24/2021 CLINICAL DATA:  Fall from ladder EXAM: CT CHEST, ABDOMEN, AND PELVIS WITH CONTRAST CT LUMBAR SPINE WITHOUT CONTRAST TECHNIQUE: Multidetector CT imaging of the chest, abdomen and pelvis was performed following the standard protocol during bolus administration of intravenous contrast. Multiplanar CT reconstructed images of the lumbar spine were generated from the CT imaging of the chest, abdomen and pelvis using bone and soft tissue reconstruction kernels. CONTRAST:  32m OMNIPAQUE IOHEXOL 350 MG/ML SOLN COMPARISON:  Chest, pelvis and lumbar radiographs 01/23/2021 FINDINGS: CT CHEST FINDINGS Cardiovascular: The aortic root is suboptimally assessed given cardiac pulsation artifact. The aorta is normal caliber. No acute luminal abnormality of the imaged aorta. No periaortic stranding or hemorrhage. Normal 3 vessel branching of the aortic arch. Proximal great vessels are unremarkable. Normal heart size. No pericardial effusion. Central pulmonary arteries are normal caliber. No large central filling defects with more distal evaluation limited by a non tailored examination. No major venous abnormalities. Mediastinum/Nodes: No mediastinal fluid or gas. Normal thyroid gland and thoracic inlet. No acute abnormality of the trachea or esophagus. No worrisome mediastinal, hilar or axillary adenopathy. Lungs/Pleura: Small to moderate left pneumothorax occupying approximately 15% of the left hemithorax volume. Some layering left pleural fluid may reflect trace hemothorax as well. Subpleural thickening in the partially atelectatic left upper lobe could reflect some pulmonary contusion or  laceration. Dependent atelectasis seen posteriorly. No right pneumothorax or effusion. Additional bandlike areas of opacity in the lung bases likely atelectatic or scarring. No consolidative airspace disease or convincing features of edema at this time. Musculoskeletal: Comminuted fractures of the left tenth and eleventh ribs with associated adjacent pleural thickening, pneumothorax and effusion/hemothorax. No other visible acute traumatic osseous injury of the chest wall, thoracic spine or included osseous structures of the shoulders and upper extremities. There is soft tissue gas and thickening adjacent the left rib fractures extending along the left chest wall. CT ABDOMEN PELVIS FINDINGS Hepatobiliary: No direct hepatic injury or perihepatic hematoma. No worrisome focal liver lesions. Smooth liver surface contour. Normal hepatic attenuation. Gallbladder contains several dependently layering calcified gallstones. No pericholecystic fluid or inflammation. No biliary ductal dilatation or intraductal gallstones. Pancreas: No direct pancreatic contusion or ductal disruption. No pancreatic ductal dilatation or surrounding inflammatory changes. Spleen: No evidence of direct splenic injury or perisplenic hematoma. Normal in size. No concerning splenic lesions. Adrenals/Urinary Tract: No adrenal hemorrhage or suspicious adrenal lesions. No direct renal injury or perinephric hemorrhage. Kidneys are normally located with symmetric enhancementand excretion without extravasation of contrast on the excretory delayed phase imaging. Few fluid attenuation cysts are seen bilaterally, largest in the right kidney measuring up to 4.2 cm in size. No suspicious renal lesion, urolithiasis or hydronephrosis. No evidence of direct bladder injury or other acute bladder abnormality. Stomach/Bowel: Distal esophagus  unremarkable. No focal bowel wall thickening or dilatation. Several loops of small bowel partially protrudes into a fat and  bowel containing right spigelian hernia (3/93). Some mild thickening and stranding of the fascia at this level, can be seen in the posttraumatic setting. No resulting mechanical obstruction at this level scattered colonic diverticula without focal inflammation to suggest diverticulitis. A normal appendix is visualized. No evidence of mesenteric hematoma or contusion. Vascular/Lymphatic: No direct vascular injury. No sites of active contrast extravasation. Atherosclerotic calcifications within the abdominal aorta and branch vessels. No aneurysm or ectasia. No enlarged abdominopelvic lymph nodes. Reproductive: Coarse eccentric calcification of the prostate. No concerning abnormalities of the prostate or seminal vesicles. Other: Fat and bowel containing right spigelian hernia with adjacent stranding and thickening, bilateral fat containing inguinal hernias, left greater than right. No other bowel containing hernias. No abdominopelvic free air or fluid. Musculoskeletal: Dedicated lumbar imaging as below. Bones of the pelvis appear intact and congruent. Few benign scattered bone islands. Degenerative changes in the hips and pelvis. Partially included upper extremities within the abdominopelvic levels of imaging are unremarkable. CT LUMBAR SPINE FINDINGS Segmentation: 5 normally formed lumbar type vertebral bodies. Lowest fully formed disc space denoted as L5-S1. Alignment: Minimal retrolisthesis L1 on L2 of approximately 2 mm and L5 on S1 of approximately 3 mm is favored to be degenerative given discogenic and facet degenerative changes at these levels. No abnormally widened, jumped or perched facets. Mild dextrocurvature of the thoracolumbar spine, apex L1-2. Vertebrae: The suspected compression deformity at L1 on radiography is likely projectional secondary to mild dextrocurvature of the spine. No acute or remote appearing compression deformities of lumbar levels. Few scattered Schmorl's node formations. Discogenic and  facet degenerative changes, better detailed below. Paraspinal and other soft tissues: No paravertebral fluid, swelling, gas or hemorrhage. No visible canal hematoma. Disc levels: Level by level evaluation of the lumbar spine below: T11-T12: Disc height loss. No significant posterior disc abnormality. Minimal facet degenerative change. Minimal disc height loss without no significant spinal canal or foraminal stenosis. Significant posterior disc T12-L1: Abnormality and minimal facet degenerative change. No significant spinal canal or foraminal stenosis. L1-L2: Minimal retrolisthesis and mild global disc bulge. No significant spinal canal or foraminal stenosis. L2-L3: Shallow global disc bulge. Mild bilateral facet arthropathy. Partial effacement of the lateral recesses. No significant spinal canal or foraminal stenosis. L3-L4: Disc height loss and bilateral facet arthropathy with only tiny central disc protrusion. No significant spinal canal or foraminal stenosis. L4-L5: Disc height loss, shallow global disc bulge with moderate bilateral facet arthropathy and ligamentum flavum thickening resulting in mild canal stenosis and bilateral foraminal narrowing with partial effacement the lateral recesses. Stenosis L5-S1: Retrolisthesis, disc height loss and global disc bulge with bilateral facet arthropathy resulting and mild canal stenosis, mild mild-to-moderate bilateral foraminal narrowing and effacement of the lateral recesses. IMPRESSION: 1. Comminuted fractures of the left tenth and eleventh ribs with associated small to moderate left pneumothorax occupying approximately 15% of the left hemithorax. Some layering left pleural fluid may reflect trace hemothorax as well. Adjacent soft tissue gas extending along the left chest wall. Subpleural thickening in the partially atelectatic left upper lobe could reflect some pulmonary contusion or laceration. 2. Fat and bowel containing right Spigelian hernia with adjacent  stranding and thickening, can be seen in the posttraumatic setting. Correlate for point tenderness. No resulting mechanical obstruction at this level. 3. No other acute abdominopelvic abnormalities. 4. Cholelithiasis without evidence of acute cholecystitis. 5. Colonic diverticulosis without evidence of  acute diverticulitis. 6. Suspected compression deformity at the L1 level is likely related to projection with some dextrocurvature noted on CT imaging. No acute lumbar fracture or traumatic listhesis. 7. Multilevel degenerative changes of the lumbar spine, as described above. 8. Aortic Atherosclerosis (ICD10-I70.0). These results were called by telephone at the time of interpretation on 01/24/2021 at 12:51 am to provider Mesner , who verbally acknowledged these results. Electronically Signed   By: Lovena Le M.D.   On: 01/24/2021 00:51   CT L-SPINE NO CHARGE  Result Date: 01/24/2021 CLINICAL DATA:  Fall from ladder EXAM: CT CHEST, ABDOMEN, AND PELVIS WITH CONTRAST CT LUMBAR SPINE WITHOUT CONTRAST TECHNIQUE: Multidetector CT imaging of the chest, abdomen and pelvis was performed following the standard protocol during bolus administration of intravenous contrast. Multiplanar CT reconstructed images of the lumbar spine were generated from the CT imaging of the chest, abdomen and pelvis using bone and soft tissue reconstruction kernels. CONTRAST:  32m OMNIPAQUE IOHEXOL 350 MG/ML SOLN COMPARISON:  Chest, pelvis and lumbar radiographs 01/23/2021 FINDINGS: CT CHEST FINDINGS Cardiovascular: The aortic root is suboptimally assessed given cardiac pulsation artifact. The aorta is normal caliber. No acute luminal abnormality of the imaged aorta. No periaortic stranding or hemorrhage. Normal 3 vessel branching of the aortic arch. Proximal great vessels are unremarkable. Normal heart size. No pericardial effusion. Central pulmonary arteries are normal caliber. No large central filling defects with more distal evaluation  limited by a non tailored examination. No major venous abnormalities. Mediastinum/Nodes: No mediastinal fluid or gas. Normal thyroid gland and thoracic inlet. No acute abnormality of the trachea or esophagus. No worrisome mediastinal, hilar or axillary adenopathy. Lungs/Pleura: Small to moderate left pneumothorax occupying approximately 15% of the left hemithorax volume. Some layering left pleural fluid may reflect trace hemothorax as well. Subpleural thickening in the partially atelectatic left upper lobe could reflect some pulmonary contusion or laceration. Dependent atelectasis seen posteriorly. No right pneumothorax or effusion. Additional bandlike areas of opacity in the lung bases likely atelectatic or scarring. No consolidative airspace disease or convincing features of edema at this time. Musculoskeletal: Comminuted fractures of the left tenth and eleventh ribs with associated adjacent pleural thickening, pneumothorax and effusion/hemothorax. No other visible acute traumatic osseous injury of the chest wall, thoracic spine or included osseous structures of the shoulders and upper extremities. There is soft tissue gas and thickening adjacent the left rib fractures extending along the left chest wall. CT ABDOMEN PELVIS FINDINGS Hepatobiliary: No direct hepatic injury or perihepatic hematoma. No worrisome focal liver lesions. Smooth liver surface contour. Normal hepatic attenuation. Gallbladder contains several dependently layering calcified gallstones. No pericholecystic fluid or inflammation. No biliary ductal dilatation or intraductal gallstones. Pancreas: No direct pancreatic contusion or ductal disruption. No pancreatic ductal dilatation or surrounding inflammatory changes. Spleen: No evidence of direct splenic injury or perisplenic hematoma. Normal in size. No concerning splenic lesions. Adrenals/Urinary Tract: No adrenal hemorrhage or suspicious adrenal lesions. No direct renal injury or perinephric  hemorrhage. Kidneys are normally located with symmetric enhancementand excretion without extravasation of contrast on the excretory delayed phase imaging. Few fluid attenuation cysts are seen bilaterally, largest in the right kidney measuring up to 4.2 cm in size. No suspicious renal lesion, urolithiasis or hydronephrosis. No evidence of direct bladder injury or other acute bladder abnormality. Stomach/Bowel: Distal esophagus unremarkable. No focal bowel wall thickening or dilatation. Several loops of small bowel partially protrudes into a fat and bowel containing right spigelian hernia (3/93). Some mild thickening and stranding of the fascia  at this level, can be seen in the posttraumatic setting. No resulting mechanical obstruction at this level scattered colonic diverticula without focal inflammation to suggest diverticulitis. A normal appendix is visualized. No evidence of mesenteric hematoma or contusion. Vascular/Lymphatic: No direct vascular injury. No sites of active contrast extravasation. Atherosclerotic calcifications within the abdominal aorta and branch vessels. No aneurysm or ectasia. No enlarged abdominopelvic lymph nodes. Reproductive: Coarse eccentric calcification of the prostate. No concerning abnormalities of the prostate or seminal vesicles. Other: Fat and bowel containing right spigelian hernia with adjacent stranding and thickening, bilateral fat containing inguinal hernias, left greater than right. No other bowel containing hernias. No abdominopelvic free air or fluid. Musculoskeletal: Dedicated lumbar imaging as below. Bones of the pelvis appear intact and congruent. Few benign scattered bone islands. Degenerative changes in the hips and pelvis. Partially included upper extremities within the abdominopelvic levels of imaging are unremarkable. CT LUMBAR SPINE FINDINGS Segmentation: 5 normally formed lumbar type vertebral bodies. Lowest fully formed disc space denoted as L5-S1. Alignment:  Minimal retrolisthesis L1 on L2 of approximately 2 mm and L5 on S1 of approximately 3 mm is favored to be degenerative given discogenic and facet degenerative changes at these levels. No abnormally widened, jumped or perched facets. Mild dextrocurvature of the thoracolumbar spine, apex L1-2. Vertebrae: The suspected compression deformity at L1 on radiography is likely projectional secondary to mild dextrocurvature of the spine. No acute or remote appearing compression deformities of lumbar levels. Few scattered Schmorl's node formations. Discogenic and facet degenerative changes, better detailed below. Paraspinal and other soft tissues: No paravertebral fluid, swelling, gas or hemorrhage. No visible canal hematoma. Disc levels: Level by level evaluation of the lumbar spine below: T11-T12: Disc height loss. No significant posterior disc abnormality. Minimal facet degenerative change. Minimal disc height loss without no significant spinal canal or foraminal stenosis. Significant posterior disc T12-L1: Abnormality and minimal facet degenerative change. No significant spinal canal or foraminal stenosis. L1-L2: Minimal retrolisthesis and mild global disc bulge. No significant spinal canal or foraminal stenosis. L2-L3: Shallow global disc bulge. Mild bilateral facet arthropathy. Partial effacement of the lateral recesses. No significant spinal canal or foraminal stenosis. L3-L4: Disc height loss and bilateral facet arthropathy with only tiny central disc protrusion. No significant spinal canal or foraminal stenosis. L4-L5: Disc height loss, shallow global disc bulge with moderate bilateral facet arthropathy and ligamentum flavum thickening resulting in mild canal stenosis and bilateral foraminal narrowing with partial effacement the lateral recesses. Stenosis L5-S1: Retrolisthesis, disc height loss and global disc bulge with bilateral facet arthropathy resulting and mild canal stenosis, mild mild-to-moderate bilateral  foraminal narrowing and effacement of the lateral recesses. IMPRESSION: 1. Comminuted fractures of the left tenth and eleventh ribs with associated small to moderate left pneumothorax occupying approximately 15% of the left hemithorax. Some layering left pleural fluid may reflect trace hemothorax as well. Adjacent soft tissue gas extending along the left chest wall. Subpleural thickening in the partially atelectatic left upper lobe could reflect some pulmonary contusion or laceration. 2. Fat and bowel containing right Spigelian hernia with adjacent stranding and thickening, can be seen in the posttraumatic setting. Correlate for point tenderness. No resulting mechanical obstruction at this level. 3. No other acute abdominopelvic abnormalities. 4. Cholelithiasis without evidence of acute cholecystitis. 5. Colonic diverticulosis without evidence of acute diverticulitis. 6. Suspected compression deformity at the L1 level is likely related to projection with some dextrocurvature noted on CT imaging. No acute lumbar fracture or traumatic listhesis. 7. Multilevel degenerative changes of  the lumbar spine, as described above. 8. Aortic Atherosclerosis (ICD10-I70.0). These results were called by telephone at the time of interpretation on 01/24/2021 at 12:51 am to provider Mesner , who verbally acknowledged these results. Electronically Signed   By: Lovena Le M.D.   On: 01/24/2021 00:51   DG Chest Port 1 View  Result Date: 01/24/2021 CLINICAL DATA:  Left pneumothorax. EXAM: PORTABLE CHEST 1 VIEW COMPARISON:  CT chest January 23, 2021 FINDINGS: The mediastinal contour and cardiac silhouette are stable. Small left apical pneumothorax is unchanged compared prior CT. The lungs are otherwise clear. The bony structures are stable. IMPRESSION: Small left apical pneumothorax unchanged compared prior CT. Electronically Signed   By: Abelardo Diesel M.D.   On: 01/24/2021 13:20   DG Chest Port 1 View  Result Date:  01/23/2021 CLINICAL DATA:  Fall EXAM: PORTABLE CHEST 1 VIEW COMPARISON:  None. FINDINGS: Heart and mediastinal contours are within normal limits. No focal opacities or effusions. No acute bony abnormality. IMPRESSION: No active disease. Electronically Signed   By: Rolm Baptise M.D.   On: 01/23/2021 22:10    Anti-infectives: Anti-infectives (From admission, onward)   Start     Dose/Rate Route Frequency Ordered Stop   01/24/21 1000  dapsone tablet 50 mg        50 mg Oral Daily 01/24/21 0158         Assessment/Plan Fall from ladder Left 10-11 rib fractures - multimodal pain control. I changed his oxy scale and schedule robaxin and tylenol. Pulm toilet.  Small left pneumothorax with some pleural effusion - CXR pending this am. On RA R knee pain - xray L knee pain - xray ? Right Spigelian hernia - noted on CT. No hernia felt on exam. Tolerating diet. NT on exam.  FEN - Reg VTE - SCDs, Loveonx ID - None currently Foley - None Dispo - Inpatient, med-surg. CXR, and b/l knee xrays. PT/OT.     LOS: 1 day    Jillyn Ledger , [facility] 01/25/2021, 9:24 AM Please see Amion for pager number during day hours 7:00am-4:30pm

## 2021-01-25 NOTE — TOC Initial Note (Signed)
Transition of Care [facility]) - Initial/Assessment Note    Patient Details  Name: Aaron [name] MRN: [id number] Date of Birth: 07/17/1961  Transition of Care [facility]) CM/SW Contact:    Oretha Milch, LCSW Phone Number: 01/25/2021, 10:32 AM  Clinical Narrative: CSW met with patient for CAGE/AID screen per trauma protocol. CSW inquired into alcohol and substance use history and noted patient denied any illicit substance use. Patient reported he drinks typically one mixed drink (about 2oz of liquor in it) 3-5 times a week and will drink more during a party. Patient denies every feeling it as negatively impacted his life and denied any cravings or symptoms of withdrawal. Patient reported people have expressed concern to him in the past about his alcohol use but feels he has been able to cease drinking when he needs to. Patient declined a referral at this time but is open to information on treatment options (AVS updated with resources).  Secondary to CAGE/AID screen patient reported he lives alone and is concerned about returning home with limited supports. Patient reported he would be open to having home health PT/OT, aid, and RN to help him adjust to his recent injuries and help him return to his baseline. Patient also reported he feels he may need equipment although would rather not. Per RN patient has a PT eval requested and should be seeing PT today for recommendations. Patient reported he would go to SNF if it is indicated although would much rather prefer getting physical therapy at home. CSW and TOC team will continue to follow pending PT/OT recs. Patient notably expressed no preference for [facility] provider or facility either way.                 Expected Discharge Plan: [place] Barriers to Discharge: Continued Medical Work up,Insurance Authorization   Patient Goals and CMS Choice Patient states their goals for this hospitalization and ongoing recovery are:: "I want to go home but  I know I might need some help around the house." CMS Medicare.gov Compare Post Acute Care list provided to:: Patient Choice offered to / list presented to : Patient  Expected Discharge Plan and Services Expected Discharge Plan: [place] In-house Referral: Clinical Social Work   Post Acute Care Choice: [place] Living arrangements for the past 2 months: [place]                 DME Arranged: N/A                    Prior Living Arrangements/Services Living arrangements for the past 2 months: Single Family Home Lives with:: Self Patient language and need for interpreter reviewed:: Yes Do you feel safe going back to the place where you live?: Yes      Need for Family Participation in Patient Care: No (Comment) Care giver support system in place?: No (comment)   Criminal Activity/Legal Involvement Pertinent to Current Situation/Hospitalization: No - Comment as needed  Activities of Daily Living      Permission Sought/Granted Permission sought to share information with : Chartered certified accountant granted to share information with : Yes, Verbal Permission Granted              Emotional Assessment Appearance:: Appears younger than stated age Attitude/Demeanor/Rapport: Charismatic,Engaged Affect (typically observed): Calm,Happy,Restless Orientation: : Oriented to Situation,Oriented to  Time,Oriented to Place,Oriented to Self Alcohol / Substance Use: Alcohol Use Psych Involvement: No (comment)  Admission diagnosis:  Fall [W19.XXXA] Hypoxia [R09.02] Pneumothorax, left [J93.9] Closed fracture of multiple ribs of left side, initial encounter [S22.42XA] Patient Active Problem List   Diagnosis Date Noted  . Pneumothorax, left 01/24/2021  . ADHD, hyperactive-impulsive type 04/30/2020  . Circadian rhythm sleep disorder, delayed sleep phase type 11/21/2019  . Adult residual type attention deficit  hyperactivity disorder (ADHD) 11/21/2019  . Galactosemia ([place]) 11/21/2019  . Dermatitis herpetiformis 09/22/2019  . ADHD   . Iron deficiency anemia 08/25/2015  . Insomnia 01/09/2015  . Sleep apnea syndrome   . RLS (restless legs syndrome)   . GERD 08/19/2009  . GASTRITIS 08/19/2009  . CELIAC SPRUE 07/23/2009  . DIARRHEA 07/02/2009  . INSOMNIA-SLEEP DISORDER-UNSPEC 06/30/2009  . ABDOMINAL PAIN OTHER SPECIFIED SITE 06/30/2009  . DEPRESSION 06/05/2008  . OBSTRUCTIVE SLEEP APNEA 06/05/2008  . RESTLESS LEGS SYNDROME 06/05/2008   PCP:  [name], MD Pharmacy:   [facility] [place], [address] [place] [address] Phone: [phone] Fax: [phone]     Social Determinants of Health (SDOH) Interventions    Readmission Risk Interventions No flowsheet data found.

## 2021-01-25 NOTE — Progress Notes (Signed)
Applied ice packs to knees. Pt teaching and demonstration with teachback of incentive spirometer.  Pt prefers ultram and Tylenol during the day. Pt stated that Tylenol wakes him up, so he does not want to take it at night.  Pillow under pt left hip for comfort and repositioning throughout the day.

## 2021-01-25 NOTE — Progress Notes (Signed)
Oxy may have caused the nausea and chills, per conversation with pt sister (per nightshift RN).  Morphine is working well.

## 2021-01-26 ENCOUNTER — Inpatient Hospital Stay (HOSPITAL_COMMUNITY): Payer: 59

## 2021-01-26 MED ORDER — POLYETHYLENE GLYCOL 3350 17 G PO PACK: 17.0000 g | PACK | Freq: Every day | ORAL | Status: DC | PRN

## 2021-01-26 MED ORDER — IBUPROFEN 600 MG PO TABS: 600.0000 mg | ORAL_TABLET | Freq: Four times a day (QID) | ORAL | Status: DC | PRN

## 2021-01-26 MED ORDER — DOCUSATE SODIUM 100 MG PO CAPS
100.0000 mg | ORAL_CAPSULE | Freq: Two times a day (BID) | ORAL | Status: DC
  Administered 2021-01-26 – 2021-01-27 (×3): 100 mg via ORAL
  Filled 2021-01-26 (×3): qty 1

## 2021-01-26 NOTE — Progress Notes (Signed)
[facility] Progress Note     Subjective: CC-  Comfortable this morning. States that he had a lot of bilateral knee pain with PT yesterday. He reports pain above his knee caps with ambulation. Denies giving way or locking. Unable to flex the knees due to pain/swelling. Did not tolerate morphine or oxycodone, tramadol does help. Ribs are sore but overall pain well controlled. Denies SOB. Pulling 1100 on IS. Tolerating diet. Denies abdominal pain, nausea, vomiting.  Objective: Vital signs in last 24 hours: Temp:  [98 F (36.7 C)-98.9 F (37.2 C)] 98.2 F (36.8 C) (03/21 0439) Pulse Rate:  [63-75] 72 (03/21 0439) Resp:  [18-20] 20 (03/21 0439) BP: (111-127)/(67-77) 115/77 (03/21 0439) SpO2:  [90 %-94 %] 92 % (03/21 0439) Last BM Date: 01/23/21  Intake/Output from previous day: 03/20 0701 - 03/21 0700 In: 300 [P.O.:300] Out: 1800 [Urine:1800] Intake/Output this shift: Total I/O In: 720 [P.O.:720] Out: 150 [Urine:150]  PE: Gen:  Alert, NAD, pleasant HEENT: EOM's intact, pupils equal and round Card:  RRR, no M/G/R heard, palpable pedal pulses Pulm:  CTAB, no W/R/R, rate and effort normal Abd: Soft, NT/ND, +BS Psych: A&Ox4  Skin: no rashes noted, warm and dry RLE: moderate knee joint effusion, limited flexion due to pain/swelling, able to do straight leg raise, no gross laxity on ligamentous exam although exam difficult due to effusion, no joint line tenderness LLE: moderate knee joint effusion, limited flexion due to pain/swelling, able to do straight leg raise, no gross laxity on ligamentous exam although exam difficult due to effusion, no joint line tenderness   Lab Results:  Recent Labs    01/23/21 2112 01/24/21 0233  WBC 9.7 11.5*  HGB 13.7 12.4*  HCT 42.2 37.9*  PLT 281 232   BMET Recent Labs    01/23/21 2112 01/24/21 0233  NA 137 139  K 4.9 4.8  CL 101 104  CO2 30 28  GLUCOSE 119* 119*  BUN 13 13  CREATININE 1.22 1.14  CALCIUM 9.2 8.9    PT/INR No results for input(s): LABPROT, INR in the last 72 hours. CMP     Component Value Date/Time   NA 139 01/24/2021 0233   NA 140 11/20/2020 1806   K 4.8 01/24/2021 0233   CL 104 01/24/2021 0233   CO2 28 01/24/2021 0233   GLUCOSE 119 (H) 01/24/2021 0233   BUN 13 01/24/2021 0233   BUN 11 11/20/2020 1806   CREATININE 1.14 01/24/2021 0233   CREATININE 1.16 07/20/2013 1004   CALCIUM 8.9 01/24/2021 0233   PROT 6.8 01/23/2021 2112   PROT 7.2 07/03/2020 1815   ALBUMIN 4.0 01/23/2021 2112   ALBUMIN 4.6 07/03/2020 1815   AST 30 01/23/2021 2112   ALT 26 01/23/2021 2112   ALKPHOS 118 01/23/2021 2112   BILITOT 0.7 01/23/2021 2112   BILITOT 0.5 07/03/2020 1815   GFRNONAA >60 01/24/2021 0233   GFRAA 87 11/20/2020 1806   Lipase  No results found for: LIPASE     Studies/Results: DG CHEST PORT 1 VIEW  Result Date: 01/26/2021 CLINICAL DATA:  Recent pneumothorax EXAM: PORTABLE CHEST 1 VIEW COMPARISON:  January 25, 2021 FINDINGS: No pneumothorax appreciable on current examination. There is slight bibasilar atelectasis. No edema or airspace opacity. Heart size and pulmonary vascularity are normal. No adenopathy. No bone lesions. IMPRESSION: Slight bibasilar atelectasis. No edema or airspace opacity. Currently no pneumothorax is appreciable. Stable cardiac silhouette. Electronically Signed   By: Lowella Grip III M.D.   On: 01/26/2021 08:00  DG Chest Port 1 View  Result Date: 01/25/2021 CLINICAL DATA:  Rib fractures. Additional provided: Chest pain, evaluate, left apical pneumothorax. EXAM: PORTABLE CHEST 1 VIEW COMPARISON:  Prior chest radiographs 01/24/2021 and earlier. FINDINGS: Shallow inspiration radiograph. Unchanged cardiomediastinal silhouette. Unchanged small left apical pneumothorax. Mild subsegmental atelectasis within the right lung base, new from the prior exam. No evidence of pleural effusion. IMPRESSION: Shallow inspiration radiograph. Unchanged small left apical  pneumothorax. Mild subsegmental atelectasis within the right lung base, new from the prior study. Electronically Signed   By: Kellie Simmering DO   On: 01/25/2021 09:49   DG Chest Port 1 View  Result Date: 01/24/2021 CLINICAL DATA:  Left pneumothorax. EXAM: PORTABLE CHEST 1 VIEW COMPARISON:  CT chest January 23, 2021 FINDINGS: The mediastinal contour and cardiac silhouette are stable. Small left apical pneumothorax is unchanged compared prior CT. The lungs are otherwise clear. The bony structures are stable. IMPRESSION: Small left apical pneumothorax unchanged compared prior CT. Electronically Signed   By: Abelardo Diesel M.D.   On: 01/24/2021 13:20   DG Knee Complete 4 Views Left  Result Date: 01/25/2021 CLINICAL DATA:  Bilateral knee pain after a fall. EXAM: LEFT KNEE - COMPLETE 4+ VIEW COMPARISON:  None. FINDINGS: No evidence of fracture or dislocation. There is a moderate knee joint effusion. Mild tricompartmental osteoarthritis is noted. Superior and inferior patellar enthesophytes are noted. IMPRESSION: 1. Moderate knee joint effusion. No evidence of fracture or dislocation. 2. Mild tricompartmental osteoarthritis. Electronically Signed   By: Zerita Boers M.D.   On: 01/25/2021 15:11   DG Knee Complete 4 Views Right  Result Date: 01/25/2021 CLINICAL DATA:  Bilateral knee pain after a fall. EXAM: RIGHT KNEE - COMPLETE 4+ VIEW COMPARISON:  None. FINDINGS: No evidence of fracture or dislocation. There is a moderate knee joint effusion. Mild tricompartmental osteoarthritis is noted. Superior and inferior patellar enthesophytes are noted. IMPRESSION: 1. Moderate knee joint effusion. No evidence of fracture or dislocation. 2. Mild tricompartmental osteoarthritis. Electronically Signed   By: Zerita Boers M.D.   On: 01/25/2021 15:10    Anti-infectives: Anti-infectives (From admission, onward)   Start     Dose/Rate Route Frequency Ordered Stop   01/24/21 1000  dapsone tablet 50 mg        50 mg Oral Daily  01/24/21 0158         Assessment/Plan Fall from ladder Left 10-11 rib fractures - multimodal pain control and pulm toilet.  Small left pneumothorax with some pleural effusion - CXR stable today without PNX R knee pain - xray negative for fx, moderate joint effusion. Discussed with ortho, no further imaging needed at this time, contraindicated for aspiration, treat symptomatically with compression wraps and ice. Follow up with Dr. Marcelino Scot if not improving L knee pain - same as above ? Right Spigelian hernia - noted on CT. No hernia felt on exam, abdominal exam benign. Tolerating diet FEN - Reg VTE - SCDs, Loveonx ID - None currently Foley - None Dispo - Continue PT/OT - recommended SNF yesterday, hopefully will progress. Continue current pain regimen with tylenol, robaxin, and tramadol.    LOS: 2 days    [facility] 01/26/2021, 9:46 AM Please see Amion for pager number during day hours 7:00am-4:30pm

## 2021-01-26 NOTE — Evaluation (Signed)
Occupational Therapy Evaluation Patient Details Name: Aaron [name] MRN: [id number] DOB: November 16, 1960 Today's Date: 01/26/2021    History of Present Illness Pt admit after fall off ladder and injuring Left 10-11 rib fractures as well as small left pneumothorax with some pleural effusion. Bil knee effusions as well with right knee swollen >left.  Of note, per X-ray there is a chronic L1 compression fx.PMH:  ADHD, GERD, Celiac disease, restless leg syndrome.   Clinical Impression   PTA, pt was living at home alone, pt reports he was independent with ADL/IADL and functional mobility. Pt limited this session secondary to symptomatic orthostatic hypotension (please see below). Pt currently requires moderate assistance to stand from lower surfaces and minimal assistance to stand from elevated surfaces. Pt requires minimal assistance for LB dressing. Due to decline in current level of function, pt would benefit from acute OT to address established goals to facilitate safe D/C to venue listed below. At this time, recommend SNF follow-up. Will continue to follow acutely.  Orthostatic Blood Pressure:  Supine: 117/70 HR 67  Standing after short distance ambulation: 80/17mHg; HR 67  Return to reclined position: 114/763mg; HR 65bpm      Follow Up Recommendations  SNF (pending progress, pt may be able to d/c home)    Equipment Recommendations  3 in 1 bedside commode    Recommendations for Other Services       Precautions / Restrictions Precautions Precautions: Fall Restrictions Weight Bearing Restrictions: No      Mobility Bed Mobility Overal bed mobility: Needs Assistance Bed Mobility: Supine to Sit     Supine to sit: Modified independent (Device/Increase time);HOB elevated     General bed mobility comments: pt up with OT upon PT arrival to room    Transfers Overall transfer level: Needs assistance Equipment used: Rolling walker (2 wheeled) Transfers: Sit to/from Stand Sit  to Stand: Mod assist;+2 safety/equipment         General transfer comment: mod +2 for initial power up, rise, weight shifting L/R to bring UEs to RW. Verbal cuing for hand placement when rising/sitting, placement of LEs in 90/90 position for solid BOS when rising.    Balance Overall balance assessment: Needs assistance Sitting-balance support: Feet supported Sitting balance-Leahy Scale: Fair Sitting balance - Comments: able to sit without external support   Standing balance support: Bilateral upper extremity supported;During functional activity Standing balance-Leahy Scale: Poor Standing balance comment: reliant on external support                           ADL either performed or assessed with clinical judgement   ADL Overall ADL's : Needs assistance/impaired Eating/Feeding: Independent   Grooming: Minimal assistance Grooming Details (indicate cue type and reason): limited by BP Upper Body Bathing: Independent   Lower Body Bathing: Minimal assistance;Sit to/from stand   Upper Body Dressing : Independent   Lower Body Dressing: Minimal assistance;Sit to/from stand   Toilet Transfer: Minimal assistance;Ambulation Toilet Transfer Details (indicate cue type and reason): pt limited by orthostatics Toileting- Clothing Manipulation and Hygiene: Minimal assistance;Sit to/from stand       Functional mobility during ADLs: Minimal assistance;Rolling walker General ADL Comments: pt limited by orthostatics this session     Vision         Perception     Praxis      Pertinent Vitals/Pain Pain Assessment: Faces Faces Pain Scale: Hurts even more Pain Location: bilat knees Pain Descriptors / Indicators: Aching;Discomfort;Grimacing;Guarding Pain  Intervention(s): Limited activity within patient's tolerance;Monitored during session;Repositioned;Premedicated before session (tramadol)     Hand Dominance Left   Extremity/Trunk Assessment Upper Extremity  Assessment Upper Extremity Assessment: Overall WFL for tasks assessed   Lower Extremity Assessment Lower Extremity Assessment: Defer to PT evaluation   Cervical / Trunk Assessment Cervical / Trunk Assessment: Kyphotic   Communication Communication Communication: No difficulties   Cognition Arousal/Alertness: Awake/alert Behavior During Therapy: WFL for tasks assessed/performed Overall Cognitive Status: Within Functional Limits for tasks assessed                                     General Comments       Exercises     Shoulder Instructions      Home Living Family/patient expects to be discharged to:: Private residence Living Arrangements: Alone Available Help at Discharge: Friend(s);Available 24 hours/day Type of Home: House Home Access: Stairs to enter [employer] of Steps: 3 Entrance Stairs-Rails: None Home Layout: Two level;Full bath on main level;Able to live on main level with bedroom/bathroom (may stay on couch, his bedroom is upstairs) Alternate Level Stairs-Number of Steps: 20 Alternate Level Stairs-Rails: Right Bathroom Shower/Tub: Walk-in shower;Tub/shower unit (walk in upstairs, tub downstairs)   Bathroom Toilet: Standard     Home Equipment: Crutches          Prior Functioning/Environment Level of Independence: Independent        Comments: retired        OT Problem List: Decreased range of motion;Decreased activity tolerance;Impaired balance (sitting and/or standing);Decreased safety awareness;Decreased knowledge of use of DME or AE;Cardiopulmonary status limiting activity;Pain;Increased edema      OT Treatment/Interventions: Self-care/ADL training;Therapeutic exercise;Energy conservation;DME and/or AE instruction;Patient/family education;Balance training    OT Goals(Current goals can be found in the care plan section) Acute Rehab OT Goals Patient Stated Goal: to get better and walk OT Goal Formulation: With  patient Time For Goal Achievement: 02/09/21 Potential to Achieve Goals: Good ADL Goals Pt Will Perform Grooming: with modified independence;standing Pt Will Perform Lower Body Dressing: with modified independence;sit to/from stand Pt Will Transfer to Toilet: with modified independence;ambulating  OT Frequency: Min 2X/week   Barriers to D/C:            Co-evaluation              AM-PAC OT "6 Clicks" Daily Activity     Outcome Measure Help from another person eating meals?: None Help from another person taking care of personal grooming?: A Little Help from another person toileting, which includes using toliet, bedpan, or urinal?: A Little Help from another person bathing (including washing, rinsing, drying)?: A Little Help from another person to put on and taking off regular upper body clothing?: A Little Help from another person to put on and taking off regular lower body clothing?: A Little 6 Click Score: 19   End of Session Equipment Utilized During Treatment: Rolling walker Nurse Communication: Mobility status  Activity Tolerance: Patient tolerated treatment well;Treatment limited secondary to medical complications (Comment) (orthostatic hypotension) Patient left: in chair;with call bell/phone within reach;with chair alarm set  OT Visit Diagnosis: Unsteadiness on feet (R26.81);Other abnormalities of gait and mobility (R26.89);History of falling (Z91.81);Pain Pain - Right/Left:  (bilateral) Pain - part of body: Knee                Time: 1400-1439 OT Time Calculation (min): 39 min Charges:  OT General Charges $OT  Visit: 1 Visit OT Evaluation $OT Eval Moderate Complexity: 1 Mod OT Treatments $Self Care/Home Management : 23-37 mins  Helene Kelp OTR/L Acute Rehabilitation Services Office: [phone]   Wyn Forster 01/26/2021, 4:18 PM

## 2021-01-26 NOTE — Progress Notes (Signed)
Physical Therapy Treatment Patient Details Name: Aaron [name] MRN: [id number] DOB: 19-Apr-1961 Today's Date: 01/26/2021    History of Present Illness Pt admit after fall off ladder and injuring Left 10-11 rib fractures as well as small left pneumothorax with some pleural effusion. Bil knee effusions as well with right knee swollen >left.  Of note, per X-ray there is a chronic L1 compression fx.PMH:  ADHD, GERD, Celiac disease, restless leg syndrome.    PT Comments    Pt up with OT in recliner upon PT arrival to room, with recent bout of symptomatic orthostatic hypotension. Once stabilized hemodynamically, pt agreeable to gait training as tolerated. Pt currently requires min-mod assist for transfer and room-distance gait training, limited by bilateral knee pain and recurrence of orthostatic hypotension during gait with BP of 80/50 and HR of 67 bpm after 15 ft ambulation with RW. Pt lives alone at baseline, states he can go home with his friend who works from home. Given new onset orthostatic hypotension, need for physical assist, and limited mobility at this time, PT continuing to recommend ST-SNF placement to return closer to baseline. Pt is hopeful to progress to home level, but is open to ST-SNF. Will continue to follow acutely.     Follow Up Recommendations  SNF (Short term SMF if pt does not improve over next few treatments; HHPT if pt improves over next few treatments)     Equipment Recommendations  Rolling walker with 5" wheels;3in1 (PT)    Recommendations for Other Services       Precautions / Restrictions Precautions Precautions: Fall Restrictions Weight Bearing Restrictions: No    Mobility  Bed Mobility Overal bed mobility: Needs Assistance             General bed mobility comments: pt up with OT upon PT arrival to room    Transfers Overall transfer level: Needs assistance Equipment used: Rolling walker (2 wheeled) Transfers: Sit to/from Stand Sit to Stand:  Mod assist;+2 safety/equipment         General transfer comment: mod +2 for initial power up, rise, weight shifting L/R to bring UEs to RW. Verbal cuing for hand placement when rising/sitting, placement of LEs in 90/90 position for solid BOS when rising.  Ambulation/Gait Ambulation/Gait assistance: Min assist;+2 safety/equipment Gait Distance (Feet): 15 Feet Assistive device: Rolling walker (2 wheeled) Gait Pattern/deviations: Step-through pattern;Trunk flexed;Decreased stride length;Antalgic Gait velocity: decr   General Gait Details: min assist to steady, verbal cuing for upright posture multiple times, placement in RW. Pt reporting dizziness and nausea after 15 ft ambulation, pt sat in chair follow with BP 80/50 with HR 67 bpm, recovered to 100s/70 after sitting 2 minutes   Stairs             Wheelchair Mobility    Modified Rankin (Stroke Patients Only)       Balance Overall balance assessment: Needs assistance Sitting-balance support: Feet supported Sitting balance-Leahy Scale: Fair Sitting balance - Comments: able to sit without external support   Standing balance support: Bilateral upper extremity supported;During functional activity Standing balance-Leahy Scale: Poor Standing balance comment: reliant on external support                            Cognition Arousal/Alertness: Awake/alert Behavior During Therapy: WFL for tasks assessed/performed Overall Cognitive Status: Within Functional Limits for tasks assessed  Exercises      General Comments        Pertinent Vitals/Pain Pain Assessment: Faces Faces Pain Scale: Hurts even more Pain Location: bilat knees Pain Descriptors / Indicators: Aching;Discomfort;Grimacing;Guarding Pain Intervention(s): Limited activity within patient's tolerance;Monitored during session;Repositioned;Premedicated before session (tramadol)    Home Living                       Prior Function            PT Goals (current goals can now be found in the care plan section) Acute Rehab PT Goals Patient Stated Goal: to get better and walk PT Goal Formulation: With patient Time For Goal Achievement: 02/08/21 Potential to Achieve Goals: Good Progress towards PT goals: Progressing toward goals    Frequency    Min 5X/week      PT Plan Current plan remains appropriate    Co-evaluation PT/OT/SLP Co-Evaluation/Treatment:  (dovetail with OT)            AM-PAC PT "6 Clicks" Mobility   Outcome Measure  Help needed turning from your back to your side while in a flat bed without using bedrails?: A Little Help needed moving from lying on your back to sitting on the side of a flat bed without using bedrails?: A Lot Help needed moving to and from a bed to a chair (including a wheelchair)?: A Lot Help needed standing up from a chair using your arms (e.g., wheelchair or bedside chair)?: A Lot Help needed to walk in hospital room?: A Little Help needed climbing 3-5 steps with a railing? : Total 6 Click Score: 13    End of Session   Activity Tolerance: Patient limited by fatigue;Patient limited by pain;Other (comment) (orthostatic hypotension) Patient left: with call bell/phone within reach;in chair;with chair alarm set Nurse Communication: Mobility status PT Visit Diagnosis: Unsteadiness on feet (R26.81);Pain Pain - Right/Left:  (bil) Pain - part of body: Knee     Time: 7116-5790 PT Time Calculation (min) (ACUTE ONLY): 20 min  Charges:  $Gait Training: 8-22 mins                     Stacie Glaze, PT Acute Rehabilitation Services Pager [phone]  Office [phone]    Louis Matte 01/26/2021, 3:56 PM

## 2021-01-26 NOTE — Discharge Instructions (Signed)
For your knee pain/swelling: - Ice, elevated, compression wraps, gentle range of motion - Call Dr. Carlean Jews (orthopedics) office for evaluation if symptoms do not improve over the next couple of weeks   [place]   1. PAIN CONTROL:  1. Pain is best controlled by a usual combination of three different methods TOGETHER:  i. Ice/Heat ii. Over the counter pain medication iii. Prescription pain medication 2. You may experience some swelling and bruising in area of broken ribs. Ice packs or heating pads (30-60 minutes up to 6 times a day) will help. Use ice for the first few days to help decrease swelling and bruising, then switch to heat to help relax tight/sore spots and speed recovery. Some people prefer to use ice alone, heat alone, alternating between ice & heat. Experiment to what works for you. Swelling and bruising can take several weeks to resolve.  3. It is helpful to take an over-the-counter pain medication regularly for the first few weeks. Choose one of the following that works best for you:  i. Naproxen (Aleve, etc) Two 243m tabs twice a day ii. Ibuprofen (Advil, etc) Three 2011mtabs four times a day (every meal & bedtime) iii. Acetaminophen (Tylenol, etc) 500-6507mour times a day (every meal & bedtime) 4. A prescription for pain medication (such as oxycodone, hydrocodone, etc) may be given to you upon discharge. Take your pain medication as prescribed.  i. If you are having problems/concerns with the prescription medicine (does not control pain, nausea, vomiting, rash, itching, etc), please call us [phone] see if we need to switch you to a different pain medicine that will work better for you and/or control your side effect better. ii. If you need a refill on your pain medication, please contact your pharmacy. They will contact our office to request authorization. Prescriptions will not be filled after 5 pm or on week-ends. 1. Avoid getting  constipated. When taking pain medications, it is common to experience some constipation. Increasing fluid intake and taking a fiber supplement (such as Metamucil, Citrucel, FiberCon, MiraLax, etc) 1-2 times a day regularly will usually help prevent this problem from occurring. A mild laxative (prune juice, Milk of Magnesia, MiraLax, etc) should be taken according to package directions if there are no bowel movements after 48 hours.  2. Watch out for diarrhea. If you have many loose bowel movements, simplify your diet to bland foods & liquids for a few days. Stop any stool softeners and decrease your fiber supplement. Switching to mild anti-diarrheal medications (Kayopectate, Pepto Bismol) can help. If this worsens or does not improve, please call us.[place]. FOLLOW UP  a. If a follow up appointment is needed one will be scheduled for you. If none is needed with our trauma team, please follow up with your primary care provider within 2-3 weeks from discharge. Please call [facility] at [phone] if you have any questions about follow up.  b. If you have any orthopedic or other injuries you will need to follow up as outlined in your follow up instructions.   WHEN TO CALL US [phone]. Poor pain control 2. Reactions / problems with new medications (rash/itching, nausea, etc)  3. Fever over 101.5 F (38.5 C) 4. Worsening swelling or bruising 5. Worsening pain, productive cough, difficulty breathing or any other concerning symptoms  The clinic staff is available to answer your questions during regular business hours (8:30am-5pm). Please don't hesitate to call and ask to speak to  one of our nurses for clinical concerns.  If you have a medical emergency, go to the nearest emergency room or call 911.  A surgeon from [facility] is always on call at the [facility], [place], [place], [place], [place] [address] ?  MAIN: [phone] ? TOLL FREE:  [phone] ?  FAX [phone]  [email or web address]      Information on Rib Fractures  A rib fracture is a break or crack in one of the bones of the ribs. The ribs are long, curved bones that wrap around your chest and attach to your spine and your breastbone. The ribs protect your heart, lungs, and other organs in the chest. A broken or cracked rib is often painful but is not usually serious. Most rib fractures heal on their own over time. However, rib fractures can be more serious if multiple ribs are broken or if broken ribs move out of place and push against other structures or organs. What are the causes? This condition is caused by:  Repetitive movements with high force, such as pitching a baseball or having severe coughing spells.  A direct blow to the chest, such as a sports injury, a car accident, or a fall.  Cancer that has spread to the bones, which can weaken bones and cause them to break. What are the signs or symptoms? Symptoms of this condition include:  Pain when you breathe in or cough.  Pain when someone presses on the injured area.  Feeling short of breath. How is this diagnosed? This condition is diagnosed with a physical exam and medical history. Imaging tests may also be done, such as:  Chest X-ray.  CT scan.  MRI.  Bone scan.  Chest ultrasound. How is this treated? Treatment for this condition depends on the severity of the fracture. Most rib fractures usually heal on their own in 1-3 months. Sometimes healing takes longer if there is a cough that does not stop or if there are other activities that make the injury worse (aggravating factors). While you heal, you will be given medicines to control the pain. You will also be taught deep breathing exercises. Severe injuries may require hospitalization or surgery. Follow these instructions at home: Managing pain, stiffness, and swelling  If directed, apply ice to the injured  area. ? Put ice in a plastic bag. ? Place a towel between your skin and the bag. ? Leave the ice on for 20 minutes, 2-3 times a day.  Take over-the-counter and prescription medicines only as told by your health care provider. Activity  Avoid a lot of activity and any activities or movements that cause pain. Be careful during activities and avoid bumping the injured rib.  Slowly increase your activity as told by your health care provider. General instructions  Do deep breathing exercises as told by your health care provider. This helps prevent pneumonia, which is a common complication of a broken rib. Your health care provider may instruct you to: ? Take deep breaths several times a day. ? Try to cough several times a day, holding a pillow against the injured area. ? Use a device called incentive spirometer to practice deep breathing several times a day.  Drink enough fluid to keep your urine pale yellow.  Do not wear a rib belt or binder. These restrict breathing, which can lead to pneumonia.  Keep all follow-up visits as told by your health care provider. This is important.  Contact a health care provider if:  You have a fever. Get help right away if:  You have difficulty breathing or you are short of breath.  You develop a cough that does not stop, or you cough up thick or bloody sputum.  You have nausea, vomiting, or pain in your abdomen.  Your pain gets worse and medicine does not help. Summary  A rib fracture is a break or crack in one of the bones of the ribs.  A broken or cracked rib is often painful but is not usually serious.  Most rib fractures heal on their own over time.  Treatment for this condition depends on the severity of the fracture.  Avoid a lot of activity and any activities or movements that cause pain. This information is not intended to replace advice given to you by your health care provider. Make sure you discuss any questions you have with your  health care provider. Document Released: 10/25/2005 Document Revised: 01/24/2017 Document Reviewed: 01/24/2017 Elsevier Interactive Patient Education  2019 [employer].                    Intensive Outpatient Programs  [facility] [address]     [place] [address] [place],  [place], [place]      [place]  (Inpatient and outpatient)  [phone] (Suboxone and Methadone) [address]           [phone]           [facility]: [facility]    [facility] - Intensive Outpatient [address]     [address] [place] [address] [place], [place] [address]     [place], [place]      [phone]  [facility] (Outpatient, Inpatient, [facility]  [facility] (Groups and [facility]) (insurance only) [phone]    [place], [place]       [facility]    [facility] (for caregivers and family) [address]     [address] [place], [place], [place]      [phone]  Residential Treatment Programs  [place]  [place](2 years) Residential: 44 days)  [facility] ([place], [place], [place] [phone]      [phone] or [phone]  [facility] [place]    [facility] [address]      [place], [place], [place]      [phone]  [place]   [facility] ([facility]) [place]     [address] [place], [place] [address]     [place], [place]      [phone] Admissions: 8am-3pm M-F  [facility]: Residential Program [phone] Days)              [facility]: [facility]  [place], [place], [place] or [phone]    (Walk in Hours over the weekend or by referral)   [place] Crisis: [facility]:[phone] (for crisis  response 24 hours a day)

## 2021-01-27 LAB — BASIC METABOLIC PANEL WITH GFR
Anion gap: 7 (ref 5–15)
BUN: 15 mg/dL (ref 8–23)
CO2: 27 mmol/L (ref 22–32)
Calcium: 8.4 mg/dL — ABNORMAL LOW (ref 8.9–10.3)
Chloride: 101 mmol/L (ref 98–111)
Creatinine, Ser: 0.96 mg/dL (ref 0.61–1.24)
GFR, Estimated: >60 mL/min
Glucose, Bld: 108 mg/dL — ABNORMAL HIGH (ref 70–99)
Potassium: 3.7 mmol/L (ref 3.5–5.1)
Sodium: 135 mmol/L (ref 135–145)

## 2021-01-27 LAB — CBC
HCT: 34 % — ABNORMAL LOW (ref 39.0–52.0)
Hemoglobin: 11.3 g/dL — ABNORMAL LOW (ref 13.0–17.0)
MCH: 32.1 pg (ref 26.0–34.0)
MCHC: 33.2 g/dL (ref 30.0–36.0)
MCV: 96.6 fL (ref 80.0–100.0)
Platelets: 206 K/uL (ref 150–400)
RBC: 3.52 MIL/uL — ABNORMAL LOW (ref 4.22–5.81)
RDW: 13.2 % (ref 11.5–15.5)
WBC: 7.3 K/uL (ref 4.0–10.5)
nRBC: 0 % (ref 0.0–0.2)

## 2021-01-27 MED ORDER — TRAMADOL HCL 50 MG PO TABS: 50.0000 mg | ORAL_TABLET | Freq: Four times a day (QID) | ORAL | 0 refills | Status: DC | PRN

## 2021-01-27 MED ORDER — ACETAMINOPHEN 500 MG PO TABS: 1000.0000 mg | ORAL_TABLET | Freq: Four times a day (QID) | ORAL | 0 refills | Status: DC | PRN

## 2021-01-27 MED ORDER — IBUPROFEN 600 MG PO TABS: 600.0000 mg | ORAL_TABLET | Freq: Four times a day (QID) | ORAL | 0 refills | Status: DC | PRN

## 2021-01-27 MED ORDER — POLYETHYLENE GLYCOL 3350 17 G PO PACK: 17.0000 g | PACK | Freq: Every day | ORAL | 0 refills | Status: DC | PRN

## 2021-01-27 MED ORDER — METHOCARBAMOL 750 MG PO TABS: 750.0000 mg | ORAL_TABLET | Freq: Three times a day (TID) | ORAL | 0 refills | Status: DC | PRN

## 2021-01-27 MED ORDER — POLYETHYLENE GLYCOL 3350 17 G PO PACK
17.0000 g | PACK | Freq: Two times a day (BID) | ORAL | Status: DC
  Administered 2021-01-27: 17 g via ORAL
  Filled 2021-01-27: qty 1

## 2021-01-27 NOTE — Progress Notes (Signed)
Occupational Therapy Treatment Patient Details Name: Aaron [name] MRN: [id number] DOB: 04/30/1961 Today's Date: 01/27/2021    History of present illness Pt admit after fall off ladder and injuring Left 10-11 rib fractures as well as small left pneumothorax with some pleural effusion. Bil knee effusions as well with right knee swollen >left.  Of note, per X-ray there is a chronic L1 compression fx.PMH:  ADHD, GERD, Celiac disease, restless leg syndrome.   OT comments  Pt agreeable to OT session this date. Updated d/c recommendation to home with no follow-up OT services. Pt currently demonstrates ability to complete ADL and functional mobility at RW level with supervision. He completed bed mobility at modified independent level. Pt is continuing to progress toward prior level of functioning. Pt's current level of functioning adequate for d/c as he will d/c to his friend's home and have her assistance as needed. All education complete, pt with no additional questions. Pt discharged from OT services.   BP 125/70 following mobility, pt asymptomatic throughout session  Follow Up Recommendations  No OT follow up    Equipment Recommendations  None recommended by OT    Recommendations for Other Services      Precautions / Restrictions Precautions Precautions: Fall Precaution Comments: watch orthostatics Restrictions Weight Bearing Restrictions: No       Mobility Bed Mobility Overal bed mobility: Modified Independent Bed Mobility: Rolling;Sidelying to Sit Rolling: Supervision Sidelying to sit: Supervision       General bed mobility comments: increased time and effort    Transfers Overall transfer level: Needs assistance Equipment used: Rolling walker (2 wheeled) Transfers: Sit to/from Stand Sit to Stand: Supervision         General transfer comment: for safety, verbal cuing for hand placement when rising/sitting.    Balance Overall balance assessment: Needs  assistance Sitting-balance support: Feet supported Sitting balance-Leahy Scale: Good Sitting balance - Comments: able to sit without external support   Standing balance support: Bilateral upper extremity supported;During functional activity Standing balance-Leahy Scale: Fair Standing balance comment: able to stand unsupported but demonstrates instability, improved balance with UE support on RW                           ADL either performed or assessed with clinical judgement   ADL Overall ADL's : Needs assistance/impaired Eating/Feeding: Independent   Grooming: Supervision/safety;Standing               Lower Body Dressing: Supervision/safety;Sit to/from stand Lower Body Dressing Details (indicate cue type and reason): pt able to don doff socks while sitting in recliner Toilet Transfer: Supervision/safety;Ambulation;RW Toilet Transfer Details (indicate cue type and reason): supervision for safety, no symptoms reported this date Toileting- Clothing Manipulation and Hygiene: Supervision/safety;Sit to/from stand       Functional mobility during ADLs: Supervision/safety;Rolling walker General ADL Comments: VSS BP 125/70 this session, no c/o of lightheaded or dizziness     Vision       Perception     Praxis      Cognition Arousal/Alertness: Awake/alert Behavior During Therapy: WFL for tasks assessed/performed Overall Cognitive Status: Within Functional Limits for tasks assessed                                          Exercises General Exercises - Lower Extremity Long Arc Quad: AROM;Both;5 reps;Seated   Shoulder Instructions         General Comments educated pt on importance of taking his time with mobility and importance of self-awareness to maximize safety with mobility    Pertinent Vitals/ Pain       Pain Assessment: Faces Faces Pain Scale: Hurts little more Pain Location: bilat knees R>L Pain Descriptors / Indicators:  Aching;Discomfort;Grimacing;Guarding Pain Intervention(s): Limited activity within patient's tolerance;Monitored during session  Home Living                                          Prior Functioning/Environment              Frequency  Min 2X/week        Progress Toward Goals  OT Goals(current goals can now be found in the care plan section)  Progress towards OT goals: Goals met/education completed, patient discharged from OT  Acute Rehab OT Goals Patient Stated Goal: to get better and walk OT Goal Formulation: With patient Time For Goal Achievement: 02/09/21 Potential to Achieve Goals: Good ADL Goals Pt Will Perform Grooming: with modified independence;standing Pt Will Perform Lower Body Dressing: with modified independence;sit to/from stand Pt Will Transfer to Toilet: with modified independence;ambulating  Plan All goals met and education completed, patient discharged from OT services;Discharge plan needs to be updated    Co-evaluation                 AM-PAC OT "6 Clicks" Daily Activity     Outcome Measure   Help from another person eating meals?: None Help from another person taking care of personal grooming?: A Little Help from another person toileting, which includes using toliet, bedpan, or urinal?: A Little Help from another person bathing (including washing, rinsing, drying)?: A Little Help from another person to put on and taking off regular upper body clothing?: A Little Help from another person to put on and taking off regular lower body clothing?: A Little 6 Click Score: 19    End of Session Equipment Utilized During Treatment: Rolling walker  OT Visit Diagnosis: Unsteadiness on feet (R26.81);Other abnormalities of gait and mobility (R26.89);History of falling (Z91.81);Pain Pain - Right/Left:  (bilateral) Pain - part of body: Knee   Activity Tolerance Patient tolerated treatment well   Patient Left with call bell/phone  within reach;in bed   Nurse Communication Mobility status        Time: 1040-1105 OT Time Calculation (min): 25 min  Charges: OT General Charges $OT Visit: 1 Visit OT Treatments $Self Care/Home Management : 23-37 mins  Teresa OTR/L Acute Rehabilitation Services Office: [phone]    Teresa J Thompson 01/27/2021, 11:38 AM    

## 2021-01-27 NOTE — Progress Notes (Signed)
Physical Therapy Treatment Patient Details Name: Aaron [name] MRN: [id number] DOB: 11/25/1960 Today's Date: 01/27/2021    History of Present Illness Pt admit after fall off ladder and injuring Left 10-11 rib fractures as well as small left pneumothorax with some pleural effusion. Bil knee effusions as well with right knee swollen >left.  Of note, per X-ray there is a chronic L1 compression fx.PMH:  ADHD, GERD, Celiac disease, restless leg syndrome.    PT Comments    Pt eager to mobilize today. Pt continues to show orthostatic hypotension during mobility, but pt asymptomatic today vs yesterday (see values below). Pt ambulatory for >100 ft with RW, and navigated a flight of steps with HHA and light assist to steady. Pt feels comfortable and confident d/c home with assist of his friend, PT encouraged supervision for all mobility, monitoring for s/s of orthostatic hypotension and not "pushing through" symptoms (dizziness, lightheadedness, nausea, diaphoresis, pallor). Pt reports understanding, pt's PA and RN notified of session. PT now recommending HHPT, recommendations updated.   - supine (BP, HR) 97/65, 58 bpm - sitting 86/66, 71 - standing 0 min 80/58, 71 - standing post-gait and steps ~5 minutes 110/96, 71   Follow Up Recommendations  Home health PT;Supervision for mobility/OOB     Equipment Recommendations  Rolling walker with 5" wheels;3in1 (PT)    Recommendations for Other Services       Precautions / Restrictions Precautions Precautions: Fall Precaution Comments: watch orthostatics Restrictions Weight Bearing Restrictions: No    Mobility  Bed Mobility   Bed Mobility: Rolling;Sidelying to Sit Rolling: Supervision Sidelying to sit: Supervision       General bed mobility comments: supervision for safety, no physical assist needed    Transfers Overall transfer level: Needs assistance Equipment used: Rolling walker (2 wheeled) Transfers: Sit to/from Stand Sit to  Stand: Supervision         General transfer comment: for safety, verbal cuing for hand placement when rising/sitting.  Ambulation/Gait Ambulation/Gait assistance: Min guard Gait Distance (Feet): 80 Feet (+60) Assistive device: Rolling walker (2 wheeled) Gait Pattern/deviations: Step-through pattern;Trunk flexed;Decreased stride length;Antalgic Gait velocity: decr   General Gait Details: min guard for safety, verbal cuing for placement in RW and upright posture. x30 ft ambulatory without AD per pt request, unsteady and antalgic so PT recommending continued use of RW.   Stairs Stairs: Yes Stairs assistance: Min assist Stair Management: Step to pattern;Forwards;One rail Right Number of Stairs: 12 General stair comments: min assist for HHA, light steadying which pt states his friend can provide to enter home. Verbal cuing for sequencing with step-to gait, having caregiver with him during stair navigation. Pt practiced x8 steps with railing to simulate climbing to second story if needed   Wheelchair Mobility    Modified Rankin (Stroke Patients Only)       Balance Overall balance assessment: Needs assistance Sitting-balance support: Feet supported Sitting balance-Leahy Scale: Fair Sitting balance - Comments: able to sit without external support   Standing balance support: Bilateral upper extremity supported;During functional activity Standing balance-Leahy Scale: Poor Standing balance comment: reliant on external support                            Cognition Arousal/Alertness: Awake/alert Behavior During Therapy: WFL for tasks assessed/performed Overall Cognitive Status: Within Functional Limits for tasks assessed  Exercises General Exercises - Lower Extremity Long Arc Quad: AROM;Both;5 reps;Seated    General Comments General comments (skin integrity, edema, etc.): supine (BP, HR) 97/65, 58 bpm; sitting  86/66, 71; standing 0 min 80/58, 71; standing post-gait and steps ~5 minutes 110/96, 71      Pertinent Vitals/Pain Pain Assessment: Faces Faces Pain Scale: Hurts little more Pain Location: bilat knees R>L Pain Descriptors / Indicators: Aching;Discomfort;Grimacing;Guarding Pain Intervention(s): Limited activity within patient's tolerance;Monitored during session;Repositioned;Premedicated before session    Home Living                      Prior Function            PT Goals (current goals can now be found in the care plan section) Acute Rehab PT Goals Patient Stated Goal: to get better and walk PT Goal Formulation: With patient Time For Goal Achievement: 02/08/21 Potential to Achieve Goals: Good Progress towards PT goals: Progressing toward goals    Frequency    Min 5X/week      PT Plan Current plan remains appropriate    Co-evaluation              AM-PAC PT "6 Clicks" Mobility   Outcome Measure  Help needed turning from your back to your side while in a flat bed without using bedrails?: A Little Help needed moving from lying on your back to sitting on the side of a flat bed without using bedrails?: A Little Help needed moving to and from a bed to a chair (including a wheelchair)?: A Little Help needed standing up from a chair using your arms (e.g., wheelchair or bedside chair)?: A Little Help needed to walk in hospital room?: A Little Help needed climbing 3-5 steps with a railing? : A Little 6 Click Score: 18    End of Session   Activity Tolerance: Other (comment);Patient tolerated treatment well (orthostatic hypotension) Patient left: with call bell/phone within reach;in chair;with chair alarm set Nurse Communication: Mobility status;Other (comment) (continued orthostatic hypotension) PT Visit Diagnosis: Unsteadiness on feet (R26.81);Pain Pain - Right/Left:  (bil) Pain - part of body: Knee     Time: 8185-6314 PT Time Calculation (min) (ACUTE  ONLY): 25 min  Charges:  $Gait Training: 23-37 mins                     Stacie Glaze, PT Acute Rehabilitation Services Pager [phone]  Office [phone]   Louis Matte 01/27/2021, 10:58 AM

## 2021-01-27 NOTE — Progress Notes (Signed)
Pt DC home, verbal and written instructions given.  Pt verbalized understanding.

## 2021-01-27 NOTE — TOC Transition Note (Signed)
Transition of Care [facility]) - CM/SW Discharge Note   Patient Details  Name: BASIR NIVEN MRN: [id number] Date of Birth: 1960-11-19  [facility] CM/SW Contact:  Ella Bodo, RN Phone Number: 01/27/2021, 2:27 PM   Clinical Narrative:  Pt medically stable for discharge home today.  He states he plans to dc home with a friend, who can provide 24h assistance.   DC address: [place], [place] Referral to [facility], per pt choice/insurance contract, for HHPT.  Referral to [facility] for RW.  Pt denies need for 3 in 1.       Final next level of care: [place] Barriers to Discharge: Barriers Resolved   Patient Goals and CMS Choice Patient states their goals for this hospitalization and ongoing recovery are:: "I want to go home but I know I might need some help around the house." CMS Medicare.gov Compare Post Acute Care list provided to:: Patient Choice offered to / list presented to : Patient                        Discharge Plan and Services In-house Referral: Clinical Social Work Discharge Planning Services: CM Consult Post Acute Care Choice: Home Health,Durable Medical Equipment          DME Arranged: Walker rolling DME Agency: [employer] Date DME Agency Contacted: 01/27/21 Time DME Agency Contacted: 1228 Representative spoke with at DME Agency: Freda Munro [place]: PT [place]: [place] Date [place]: 01/27/21 Time [place]: 1140 Representative spoke with at [place]: [place] ([place]) Interventions     Readmission Risk Interventions No flowsheet data found.  Reinaldo Raddle, RN, BSN  Trauma/Neuro ICU Case Manager [phone]

## 2021-01-27 NOTE — Progress Notes (Addendum)
[facility] Progress Note     Subjective: CC- Knee pain improved. States it started feeling a lot better last night after using ice packs frequently. Reports he has been able to mobilize in his room and can now perform partial knee flexion. Tolerating PO. Denies Bm since admission. Reports some nagging left anterior chest wall discomfort. No reported SOB or urinary sxs. Pulling 1000 on IS. Reports constipation - wants to have a BM before discharge  Objective: Vital signs in last 24 hours: Temp:  [97.7 F (36.5 C)-98.3 F (36.8 C)] 97.8 F (36.6 C) (03/22 0525) Pulse Rate:  [54-71] 54 (03/22 0525) Resp:  [16-20] 19 (03/22 0525) BP: (107-124)/(59-85) 114/62 (03/22 0525) SpO2:  [89 %-95 %] 94 % (03/22 0525) Last BM Date: 01/23/21  Intake/Output from previous day: 03/21 0701 - 03/22 0700 In: 1530 [P.O.:1530] Out: 500 [Urine:500] Intake/Output this shift: No intake/output data recorded.  PE: Gen:  Alert, NAD, pleasant HEENT: EOM's intact, pupils equal and round Card:  RRR, no M/G/R heard, palpable pedal pulses Pulm:  CTAB, no W/R/R, rate and effort normal Abd: Soft, NT/ND, +BS Psych: A&Ox4  Skin: no rashes noted, warm and dry RLE: moderate knee joint effusion, limited flexion due to pain/swelling, able to do straight leg raise,  no joint line tenderness  LLE: moderate knee joint effusion, limited flexion due to pain/swelling, able to do straight leg raise,  no joint line tenderness   Lab Results:  Recent Labs    01/27/21 0336  WBC 7.3  HGB 11.3*  HCT 34.0*  PLT 206   BMET Recent Labs    01/27/21 0336  NA 135  K 3.7  CL 101  CO2 27  GLUCOSE 108*  BUN 15  CREATININE 0.96  CALCIUM 8.4*   CMP     Component Value Date/Time   NA 135 01/27/2021 0336   NA 140 11/20/2020 1806   K 3.7 01/27/2021 0336   CL 101 01/27/2021 0336   CO2 27 01/27/2021 0336   GLUCOSE 108 (H) 01/27/2021 0336   BUN 15 01/27/2021 0336   BUN 11 11/20/2020 1806   CREATININE  0.96 01/27/2021 0336   CREATININE 1.16 07/20/2013 1004   CALCIUM 8.4 (L) 01/27/2021 0336   PROT 6.8 01/23/2021 2112   PROT 7.2 07/03/2020 1815   ALBUMIN 4.0 01/23/2021 2112   ALBUMIN 4.6 07/03/2020 1815   AST 30 01/23/2021 2112   ALT 26 01/23/2021 2112   ALKPHOS 118 01/23/2021 2112   BILITOT 0.7 01/23/2021 2112   BILITOT 0.5 07/03/2020 1815   GFRNONAA >60 01/27/2021 0336   GFRAA 87 11/20/2020 1806   Lipase  No results found for: LIPASE  Studies/Results: DG CHEST PORT 1 VIEW  Result Date: 01/26/2021 CLINICAL DATA:  Recent pneumothorax EXAM: PORTABLE CHEST 1 VIEW COMPARISON:  January 25, 2021 FINDINGS: No pneumothorax appreciable on current examination. There is slight bibasilar atelectasis. No edema or airspace opacity. Heart size and pulmonary vascularity are normal. No adenopathy. No bone lesions. IMPRESSION: Slight bibasilar atelectasis. No edema or airspace opacity. Currently no pneumothorax is appreciable. Stable cardiac silhouette. Electronically Signed   By: Lowella Grip III M.D.   On: 01/26/2021 08:00   DG Knee Complete 4 Views Left  Result Date: 01/25/2021 CLINICAL DATA:  Bilateral knee pain after a fall. EXAM: LEFT KNEE - COMPLETE 4+ VIEW COMPARISON:  None. FINDINGS: No evidence of fracture or dislocation. There is a moderate knee joint effusion. Mild tricompartmental osteoarthritis is noted. Superior and inferior patellar enthesophytes are  noted. IMPRESSION: 1. Moderate knee joint effusion. No evidence of fracture or dislocation. 2. Mild tricompartmental osteoarthritis. Electronically Signed   By: Zerita Boers M.D.   On: 01/25/2021 15:11   DG Knee Complete 4 Views Right  Result Date: 01/25/2021 CLINICAL DATA:  Bilateral knee pain after a fall. EXAM: RIGHT KNEE - COMPLETE 4+ VIEW COMPARISON:  None. FINDINGS: No evidence of fracture or dislocation. There is a moderate knee joint effusion. Mild tricompartmental osteoarthritis is noted. Superior and inferior patellar  enthesophytes are noted. IMPRESSION: 1. Moderate knee joint effusion. No evidence of fracture or dislocation. 2. Mild tricompartmental osteoarthritis. Electronically Signed   By: Zerita Boers M.D.   On: 01/25/2021 15:10    Anti-infectives: Anti-infectives (From admission, onward)   Start     Dose/Rate Route Frequency Ordered Stop   01/24/21 1000  dapsone tablet 50 mg        50 mg Oral Daily 01/24/21 0158         Assessment/Plan Fall from ladder Left 10-11 rib fractures - multimodal pain control and pulm toilet.  Small left pneumothorax with some pleural effusion - CXR stable today without PNX R knee pain - xray negative for fx, moderate joint effusion. Discussed with ortho, no further imaging needed at this time, contraindicated for aspiration, treat symptomatically with compression wraps and ice. Follow up with Dr. Marcelino Scot if not improving L knee pain - same as above ? Right Spigelian hernia - noted on CT. No hernia felt on exam, abdominal exam benign. Tolerating diet FEN - Reg VTE - SCDs, Loveonx ID - None currently Foley - None Dispo - Continue PT/OT - recommended SNF yesterday, hopefully will progress and be able to go home later today.  Continue current pain regimen with tylenol, robaxin, and PRN tramadol.    LOS: 3 days    [facility] 01/27/2021, 7:44 AM Please see Amion for pager number during day hours 7:00am-4:30pm

## 2021-02-09 ENCOUNTER — Other Ambulatory Visit: Payer: Self-pay | Admitting: Neurology

## 2021-02-09 ENCOUNTER — Telehealth: Payer: Self-pay | Admitting: Neurology

## 2021-02-09 NOTE — Telephone Encounter (Signed)
I have routed this request to Dr Brett Fairy for review. The pt is due for the medication and [place] registry was verified.

## 2021-02-09 NOTE — Telephone Encounter (Signed)
Pt request refill lisdexamfetamine (VYVANSE) 20 MG capsule at [facility] #[id number]

## 2021-02-10 MED ORDER — LISDEXAMFETAMINE DIMESYLATE 20 MG PO CAPS: 20.0000 mg | ORAL_CAPSULE | Freq: Every day | ORAL | 0 refills | Status: DC | PRN

## 2021-02-10 NOTE — Discharge Summary (Signed)
[facility] Discharge Summary   Patient ID: Aaron [name] MRN: [id number] DOB/AGE: Feb 14, 1961 60 y.o.  Admit date: 01/23/2021 Discharge date: 01/27/21  Discharge Diagnosis Patient Active Problem List   Diagnosis Date Noted  . Pneumothorax, left 01/24/2021  . ADHD, hyperactive-impulsive type 04/30/2020  . Circadian rhythm sleep disorder, delayed sleep phase type 11/21/2019  . Adult residual type attention deficit hyperactivity disorder (ADHD) 11/21/2019  . Galactosemia ([place]) 11/21/2019  . Dermatitis herpetiformis 09/22/2019  . ADHD   . Iron deficiency anemia 08/25/2015  . Insomnia 01/09/2015  . Sleep apnea syndrome   . RLS (restless legs syndrome)   . GERD 08/19/2009  . GASTRITIS 08/19/2009  . CELIAC SPRUE 07/23/2009  . DIARRHEA 07/02/2009  . INSOMNIA-SLEEP DISORDER-UNSPEC 06/30/2009  . ABDOMINAL PAIN OTHER SPECIFIED SITE 06/30/2009  . DEPRESSION 06/05/2008  . OBSTRUCTIVE SLEEP APNEA 06/05/2008  . RESTLESS LEGS SYNDROME 06/05/2008   HPI: STANDLEY [name] a 60 y.o.male who presents after falling from a ladder.  He was on top of the ladder and trimming bushes and cleaning gutters and the ladder was unstable and he fell and hit his back on the brick wall. He states that he has some left chest and flank pain. He was not quite clear if he hit his head or not.Patient was noted to be diaphoretic by EMS but no meds prior to arrival. Patient is not on any blood thinners  Hospital Course:  Trauma workup revealed the below injuries along with their management:   Fall from ladder Left 10-11 rib fractures- multimodal pain control and pulm toilet.   Small left pneumothorax with some pleural effusion- CXR stable today without PNX  R knee pain- xray negative for fx, moderate joint effusion. Discussed with ortho, no further imaging needed at this time, contraindicated for aspiration, treat symptomatically with compression wraps and ice. Follow up with Dr. Marcelino Scot if  not improving  L knee pain- same as above  ? Right Spigelian hernia- noted on CT. No hernia felt on exam, abdominal exam benign. Tolerating diet   On 01/27/21 the patients pain was controlled, vitals stable, knee pain improving, mobilizing better, tolerating PO, and felt stable for discharge home. Follow up as needed below.   Allergies as of 01/27/2021      Reactions   Gluten Meal Other (See Comments)      Medication List    STOP taking these medications   amoxicillin 500 MG tablet Commonly known as: AMOXIL     TAKE these medications   acetaminophen 500 MG tablet Commonly known as: TYLENOL Take 2 tablets (1,000 mg total) by mouth every 6 (six) hours as needed for mild pain.   amLODipine 10 MG tablet Commonly known as: NORVASC Take 1 tablet (10 mg total) by mouth daily.   dapsone 25 MG tablet Take 50 mg by mouth daily.   ferrous sulfate 325 (65 FE) MG tablet Take 325 mg by mouth daily. 2 times weekly   fish oil-omega-3 fatty acids 1000 MG capsule Take 2 g by mouth 2 (two) times a week.   ibuprofen 600 MG tablet Commonly known as: ADVIL Take 1 tablet (600 mg total) by mouth every 6 (six) hours as needed for mild pain or moderate pain.   indomethacin 25 MG capsule Commonly known as: INDOCIN Take 1 capsule (25 mg total) by mouth 3 (three) times daily as needed (for gout flare with food.).   lisdexamfetamine 20 MG capsule Commonly known as: Vyvanse Take 1 capsule (20 mg total) by  mouth daily. What changed:   when to take this  reasons to take this   methocarbamol 750 MG tablet Commonly known as: ROBAXIN Take 1 tablet (750 mg total) by mouth every 8 (eight) hours as needed for muscle spasms.   Mitigare 0.6 MG Caps Generic drug: Colchicine TAKE 2 CAPSULES BY MOUTH AT ONSET OF GOUT FLARE, 1 ADDITIONAL CAPSULE IN 1 HOUR IF NEEDED What changed: See the new instructions.   polyethylene glycol 17 g packet Commonly known as: MIRALAX / GLYCOLAX Take 17 g by mouth  daily as needed for mild constipation.   sertraline 100 MG tablet Commonly known as: ZOLOFT TAKE 1 TABLET(100 MG) BY MOUTH DAILY What changed: See the new instructions.   traMADol 50 MG tablet Commonly known as: ULTRAM Take 1-2 tablets (50-100 mg total) by mouth every 6 (six) hours as needed for moderate pain or severe pain (61m for moderate pain, 1018mfor severe pain).   traZODone 50 MG tablet Commonly known as: DESYREL TAKE 1 TABLET(50 MG) BY MOUTH AT BEDTIME AS NEEDED FOR SLEEP What changed: See the new instructions.         Follow-up Information    GrWendie AgresteMD. Call.   Specialties: Family Medicine, Sports Medicine Why: Call to arrange follow up with your primary care physician in 2-3 weeks regarding rib fractures Contact information: [address] [phone]        HaAltamese CarolinaMD. Schedule an appointment as soon as possible for a visit.   Specialty: Orthopedic Surgery Why: Call for appointment to evaluate knee pain if not improving over the next couple of weeks Contact information: [phone]        Care, [facility] Phfollow up.   Specialty: Home Health Services Why: Physical therapist to follow up with you at home.  They will call you to schedule an appointment Contact information: [address] [place] [phone]             Signed: ElObie DredgePAAdventhealth Murrayurgery 02/10/2021, 1:29 PM

## 2021-02-12 ENCOUNTER — Telehealth: Payer: Self-pay | Admitting: Family Medicine

## 2021-02-12 NOTE — Telephone Encounter (Signed)
.  Home Health Certification or Plan of Care Tracking  Is this a Certification or Plan of Care? Aaron [name]  [facility] Agency: [facility] health    Order Number:  [id number]  Has charge sheet been attached? Y Where has form been placed:  I have placed this in the bin up front with a charge sheet.

## 2021-02-12 NOTE — Telephone Encounter (Signed)
Placed in providers "Sign" folder

## 2021-02-18 ENCOUNTER — Telehealth: Payer: Self-pay | Admitting: Family Medicine

## 2021-02-18 NOTE — Telephone Encounter (Signed)
..  Home Health Certification or Plan of Care Tracking  Is this a Certification or Plan of Care?  Yes  [facility] Agency:  Alvis Lemmings  Order Number:  [id number] & [id number]  Has charge sheet been attached? Yes  Where has form been placed:  Up front in Dr. Vonna Kotyk bin

## 2021-03-10 ENCOUNTER — Ambulatory Visit: Payer: 59 | Admitting: Adult Health

## 2021-03-26 ENCOUNTER — Other Ambulatory Visit: Payer: Self-pay | Admitting: Neurology

## 2021-03-26 ENCOUNTER — Telehealth: Payer: Self-pay | Admitting: Neurology

## 2021-03-26 ENCOUNTER — Ambulatory Visit: Payer: 59 | Admitting: Adult Health

## 2021-03-26 MED ORDER — LISDEXAMFETAMINE DIMESYLATE 20 MG PO CAPS
20.0000 mg | ORAL_CAPSULE | Freq: Every day | ORAL | 0 refills | Status: DC | PRN
Start: 2021-03-26 — End: 2021-05-04

## 2021-03-26 NOTE — Telephone Encounter (Signed)
I have routed this request to Dr Brett Fairy for review. The pt is due for the medication and [place] registry was verified.

## 2021-03-26 NOTE — Telephone Encounter (Signed)
Pt request refill lisdexamfetamine (VYVANSE) 20 MG capsule at [facility] #[id number]

## 2021-04-22 ENCOUNTER — Ambulatory Visit: Payer: 59 | Admitting: Family Medicine

## 2021-04-28 ENCOUNTER — Other Ambulatory Visit: Payer: Self-pay

## 2021-04-28 ENCOUNTER — Ambulatory Visit (INDEPENDENT_AMBULATORY_CARE_PROVIDER_SITE_OTHER): Payer: 59 | Admitting: Dermatology

## 2021-04-28 DIAGNOSIS — L130 Dermatitis herpetiformis: Secondary | ICD-10-CM

## 2021-04-28 DIAGNOSIS — L821 Other seborrheic keratosis: Secondary | ICD-10-CM | POA: Diagnosis not present

## 2021-04-28 MED ORDER — DAPSONE 25 MG PO TABS: 50.0000 mg | ORAL_TABLET | Freq: Two times a day (BID) | ORAL | 1 refills | Status: AC

## 2021-04-28 MED ORDER — DAPSONE 25 MG PO TABS: 50.0000 mg | ORAL_TABLET | Freq: Every day | ORAL | 3 refills | Status: DC

## 2021-04-28 NOTE — Patient Instructions (Addendum)
GoodRx  Single Care   Laid in the visit Ronalee Belts was nice and after share with me a little them yet he found online about successful therapy that at least 1 individual to the age had using colchicine.  We discussed that I use this for several non-gout (which she also has a history of) and I am comfortable with this medication, but I will do a literature search to see if there are actual publications using colchicine to treat dermatitis herpetiformis.  We also discussed older literature with successful treatment with sulfapyridine but apparently this is no longer available in the [place].  Sulfasalazine which is used for inflammatory bowel disease is converted into sulfapyridine but I have no experience with that medication.  Bottom line: We will first try to get affordable dapsone: He has been on 25 mg 2 daily; we will check whether or not 100 mg pill taken every other day would be much more affordable and hopefully control the process.

## 2021-05-04 ENCOUNTER — Encounter: Payer: Self-pay | Admitting: Dermatology

## 2021-05-04 ENCOUNTER — Other Ambulatory Visit: Payer: Self-pay | Admitting: Neurology

## 2021-05-04 NOTE — Progress Notes (Signed)
   New Patient   Subjective  Aaron [name] is a 60 y.o. male who presents for the following: Skin Problem (Patient has celiac disease and has a skin condition dermatitis herpetiformis. 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 She has him on dapsone for his skin condition. Patient has breakouts that itch from this. If there are no other treatments he needs refill on dapsone. His dapsone is very expensive now with his new insurance. ).  Dermatitis herpetiformis plus skin check Location:  Duration:  Quality:  Associated Signs/Symptoms: Modifying Factors:  Severity:  Timing: Context:    The following portions of the chart were reviewed this encounter and updated as appropriate:  Tobacco  Allergies  Meds  Problems  Med Hx  Surg Hx  Fam Hx       Objective  Well appearing patient in no apparent distress; mood and affect are within normal limits. Neck - Anterior, Right Forearm - Posterior No active vesicles present today.  I spent roughly 35 minutes discussing the challenges of medical therapy for this disorder (his dapsone reasonably became quite expensive, sulfapyridine is no longer available in the [place]).  He has previously been on the 25 mg tablets of dapsone and it may be more economical for him to take 143m, perhaps every other day.  He will check with his insurance company about this.  We also spent time discussing a strict gluten-free diet; he is quite aware of the potential benefits for celiac disease and DH but finds a true gluten-free diet to be extremely difficult to maintain.  This is most understandable.  Right Lower Back 6 mm brown textured flattopped papule    All skin waist up examined.   Assessment & Plan  Dermatitis herpetiformis Neck - Anterior; Right Forearm - Posterior  We will find out whether he can change his dapsone dosing (recently 254m2 daily) to try and decrease cost.  He may also check for other pharmaceutical sources  for this medication.  Initial follow-up by telephone or MyChart in 2 weeks.  Related Medications dapsone 25 MG tablet Take 2 tablets (50 mg total) by mouth 2 (two) times daily.  Seborrheic keratosis Right Lower Back  Benign okay to leave if stable

## 2021-05-04 NOTE — Telephone Encounter (Signed)
Pt request refill lisdexamfetamine (VYVANSE) 20 MG capsule at [facility] #[id number]

## 2021-05-04 NOTE — Telephone Encounter (Signed)
Per [place] registry, last filled on 03/31/2021 Vyvanse 20 Mg Capsule #30/30.  Will send rx request to Dr Jaynee Eagles who is work-in MD.

## 2021-05-05 MED ORDER — LISDEXAMFETAMINE DIMESYLATE 20 MG PO CAPS
20.0000 mg | ORAL_CAPSULE | Freq: Every day | ORAL | 0 refills | Status: DC | PRN
Start: 2021-05-05 — End: 2021-06-30

## 2021-05-20 ENCOUNTER — Other Ambulatory Visit: Payer: Self-pay

## 2021-05-20 ENCOUNTER — Ambulatory Visit: Payer: Self-pay | Admitting: Family Medicine

## 2021-05-20 ENCOUNTER — Ambulatory Visit (INDEPENDENT_AMBULATORY_CARE_PROVIDER_SITE_OTHER): Payer: 59 | Admitting: Family Medicine

## 2021-05-20 ENCOUNTER — Encounter: Payer: Self-pay | Admitting: Family Medicine

## 2021-05-20 VITALS — BP 126/76 | HR 84 | Temp 98.2°F | Resp 17 | Ht 72.0 in | Wt 200.2 lb

## 2021-05-20 DIAGNOSIS — M25462 Effusion, left knee: Secondary | ICD-10-CM

## 2021-05-20 DIAGNOSIS — Z13220 Encounter for screening for lipoid disorders: Secondary | ICD-10-CM

## 2021-05-20 DIAGNOSIS — I10 Essential (primary) hypertension: Secondary | ICD-10-CM

## 2021-05-20 DIAGNOSIS — M25562 Pain in left knee: Secondary | ICD-10-CM

## 2021-05-20 DIAGNOSIS — F329 Major depressive disorder, single episode, unspecified: Secondary | ICD-10-CM

## 2021-05-20 DIAGNOSIS — R739 Hyperglycemia, unspecified: Secondary | ICD-10-CM

## 2021-05-20 MED ORDER — SERTRALINE HCL 100 MG PO TABS: 100.0000 mg | ORAL_TABLET | Freq: Every day | ORAL | 2 refills | Status: DC

## 2021-05-20 MED ORDER — AMLODIPINE BESYLATE 10 MG PO TABS: 10.0000 mg | ORAL_TABLET | Freq: Every day | ORAL | 2 refills | Status: DC

## 2021-05-20 NOTE — Patient Instructions (Signed)
Xray and labs at [facility] tomorrow. I will let you know if any concerns. No changes for now. Thanks for coming in today and let me know if there are questions.  Return to the clinic or go to the nearest emergency room if any of your symptoms worsen or new symptoms occur.

## 2021-05-20 NOTE — Progress Notes (Signed)
Subjective:  Patient ID: Aaron [name], male    DOB: [birth year]  Age: 60 y.o. MRN: [id number]  CC:  Chief Complaint  Patient presents with   Fall    Pt reports fell a few days ago in garage carrying boxes hit both knees, pt reports pain in both but Lt has more severe swelling. Pt better than a few days ago but would like to have this checked    Depression    Pt reports needs refill sertraline works well   Hypertension    Pt here needs refill of amlodipine denies physical sxs.    Immunizations    Pt due for next pneumonia willing to have done today     HPI Aaron [name] presents for   Routine follow-up as well as acute concerns as above  History of fall: [place] while carrying boxes, hit both knees, date of injury 05/15/21, tripped and hit both knees, pain for 3 days, improving. Able to weight bear, but limping.  Pain has improved but still sore.  Some abrasions on R leg, no pain on R leg.  No prior left knee surgery or injuries.   Depression: Reactive depression.  More symptoms during the winter.  Zoloft increased to 100 mg daily previously, good and bad days.  Higher dose was working okay at his last visit in January. Sleep was stable at that time, followed by sleep specialist and treated with Vyvanse and trazodone. Zoloft working ok. Good and bad days. More good than bad. In support groups, working well.   Depression screen [facility] 2/9 05/20/2021 11/20/2020 07/03/2020 07/03/2020 03/27/2020  Decreased Interest 1 1 0 0 1  Down, Depressed, Hopeless 1 1 1  0 0  PHQ - 2 Score 2 2 1  0 1  Altered sleeping 3 1 1  - 3  Tired, decreased energy 1 1 2  - 3  Change in appetite 1 0 1 - 1  Feeling bad or failure about yourself  0 1 0 - 1  Trouble concentrating 1 0 0 - 3  Moving slowly or fidgety/restless 0 0 0 - 3  Suicidal thoughts 0 0 0 - 0  PHQ-9 Score 8 5 5  - 15  Difficult doing work/chores - - Somewhat difficult - -    Hypertension: Amlodipine 10 mg daily. No new ankle swelling.  Home  readings: normal at pharmacy.  No new side effects.  BP Readings from Last 3 Encounters:  05/20/21 126/76  01/27/21 126/76  11/20/20 133/88   Lab Results  Component Value Date   CREATININE 0.96 01/27/2021  Fasting today.   Lab Results  Component Value Date   CHOL 138 07/03/2020   HDL 47 07/03/2020   LDLCALC 74 07/03/2020   TRIG 91 07/03/2020   CHOLHDL 2.9 07/03/2020    Hyperglycemia: 108-119 on labs earlier this year. Some increased thirst at times with warmer weather.  Has recovered from prior injuries in March.  No results found for: HGBA1C  History Patient Active Problem List   Diagnosis Date Noted   Pneumothorax, left 01/24/2021   ADHD, hyperactive-impulsive type 04/30/2020   Circadian rhythm sleep disorder, delayed sleep phase type 11/21/2019   Adult residual type attention deficit hyperactivity disorder (ADHD) 11/21/2019   Galactosemia ([place]) 11/21/2019   Dermatitis herpetiformis 09/22/2019   ADHD    Iron deficiency anemia 08/25/2015   Insomnia 01/09/2015   Sleep apnea syndrome    RLS (restless legs syndrome)    GERD 08/19/2009   GASTRITIS 08/19/2009   CELIAC SPRUE  07/23/2009   DIARRHEA 07/02/2009   INSOMNIA-SLEEP DISORDER-UNSPEC 06/30/2009   ABDOMINAL PAIN OTHER SPECIFIED SITE 06/30/2009   DEPRESSION 06/05/2008   OBSTRUCTIVE SLEEP APNEA 06/05/2008   RESTLESS LEGS SYNDROME 06/05/2008   Past Medical History:  Diagnosis Date   ADHD    Celiac disease    GERD (gastroesophageal reflux disease)    RLS (restless legs syndrome)    Sleep apnea syndrome    [facility]  [facility],  12-2-- 2004   Past Surgical History:  Procedure Laterality Date   APPENDECTOMY     HERNIA REPAIR     Allergies  Allergen Reactions   Gluten Meal Other (See Comments)   Prior to Admission medications   Medication Sig Start Date End Date Taking? Authorizing Provider  acetaminophen (TYLENOL) 500 MG tablet Take 2 tablets (1,000 mg total) by mouth every 6 (six)  hours as needed for mild pain. 01/27/21  Yes Meuth, Brooke A, PA-C  amLODipine (NORVASC) 10 MG tablet Take 1 tablet (10 mg total) by mouth daily. 11/20/20  Yes [name], MD  dapsone 25 MG tablet Take 2 tablets (50 mg total) by mouth 2 (two) times daily. 04/28/21  Yes Lavonna Monarch, MD  ferrous sulfate 325 (65 FE) MG tablet Take 325 mg by mouth daily. 2 times weekly   Yes [provider]  fish oil-omega-3 fatty acids 1000 MG capsule Take 2 g by mouth 2 (two) times a week.    Yes [provider]  ibuprofen (ADVIL) 600 MG tablet Take 1 tablet (600 mg total) by mouth every 6 (six) hours as needed for mild pain or moderate pain. 01/27/21  Yes Meuth, Brooke A, PA-C  indomethacin (INDOCIN) 25 MG capsule Take 1 capsule (25 mg total) by mouth 3 (three) times daily as needed (for gout flare with food.). 11/20/20  Yes [name], MD  lisdexamfetamine (VYVANSE) 20 MG capsule Take 1 capsule (20 mg total) by mouth daily as needed (adhd). 05/05/21  Yes Melvenia Beam, MD  MITIGARE 0.6 MG CAPS TAKE 2 CAPSULES BY MOUTH AT ONSET OF GOUT FLARE, 1 ADDITIONAL CAPSULE IN 1 HOUR IF NEEDED Patient taking differently: Take 2 capsules by mouth See admin instructions. At onset of gout flares 07/16/20  Yes [name], MD  sertraline (ZOLOFT) 100 MG tablet TAKE 1 TABLET(100 MG) BY MOUTH DAILY Patient taking differently: Take 100 mg by mouth at bedtime. 01/01/21  Yes [name], MD  traZODone (DESYREL) 50 MG tablet TAKE 1 TABLET(50 MG) BY MOUTH AT BEDTIME AS NEEDED FOR SLEEP Patient taking differently: Take 50 mg by mouth at bedtime. 08/19/20  Yes Dohmeier, Asencion Partridge, MD  methocarbamol (ROBAXIN) 750 MG tablet Take 1 tablet (750 mg total) by mouth every 8 (eight) hours as needed for muscle spasms. 01/27/21   Meuth, Brooke A, PA-C  polyethylene glycol (MIRALAX / GLYCOLAX) 17 g packet Take 17 g by mouth daily as needed for mild constipation. 01/27/21   Meuth, Blaine Hamper, PA-C  traMADol (ULTRAM) 50  MG tablet Take 1-2 tablets (50-100 mg total) by mouth every 6 (six) hours as needed for moderate pain or severe pain (86m for moderate pain, 1061mfor severe pain). 01/27/21   Meuth, BrBlaine HamperPA-C   Social History   Socioeconomic History   Marital status: Married    Spouse name: MaRiverside Number of children: 3   Years of education: Not on file   Highest education level: Not on file  Occupational  History   Occupation: Freight forwarder  Tobacco Use   Smoking status: Never   Smokeless tobacco: Never  Substance and Sexual Activity   Alcohol use: Yes    Alcohol/week: 7.0 standard drinks    Types: 7 Standard drinks or equivalent per week    Comment: Occassionally   Drug use: No   Sexual activity: Yes  Other Topics Concern   Not on file  Social History Narrative   Patient lives at home with wife [name].    Patient has 3 children.    Patient has a BA.    Social Determinants of Health   Financial Resource Strain: Not on file  Food Insecurity: Not on file  Transportation Needs: Not on file  Physical Activity: Not on file  Stress: Not on file  Social Connections: Not on file  Intimate Partner Violence: Not on file    Review of Systems  Constitutional:  Negative for fatigue and unexpected weight change.  Eyes:  Negative for visual disturbance.  Respiratory:  Negative for cough, chest tightness and shortness of breath.   Cardiovascular:  Negative for chest pain, palpitations and leg swelling.  Gastrointestinal:  Negative for abdominal pain and blood in stool.  Neurological:  Negative for dizziness, light-headedness and headaches.    Objective:   Vitals:   05/20/21 1600  BP: 126/76  Pulse: 84  Resp: 17  Temp: 98.2 F (36.8 C)  TempSrc: Temporal  SpO2: 94%  Weight: 200 lb 3.2 oz (90.8 kg)  Height: 6' (1.829 m)     Physical Exam Vitals reviewed.  Constitutional:      Appearance: He is well-developed.  HENT:     Head: Normocephalic and atraumatic.  Neck:     Vascular: No  carotid bruit or JVD.  Cardiovascular:     Rate and Rhythm: Normal rate and regular rhythm.     Heart sounds: Normal heart sounds. No murmur heard. Pulmonary:     Effort: Pulmonary effort is normal.     Breath sounds: Normal breath sounds. No rales.  Musculoskeletal:        General: Swelling (1-2+ effusion on left knee,) and tenderness (Mid patella left knee, joint line nontender.  Able to straight leg raise without significant extensor lag.  Discomfort with terminal extension and flexion.  Somewhat limited on both.  Neurovascular intact distally) present.     Right lower leg: No edema.     Left lower leg: No edema.  Skin:    General: Skin is warm and dry.  Neurological:     Mental Status: He is alert and oriented to person, place, and time.  Psychiatric:        Mood and Affect: Mood normal.       Assessment & Plan:  GAILEN VENNE is a 60 y.o. male . Pain and swelling of left knee - Plan: DG Knee Complete 4 Views Left  -Injury 5 days ago, able to weight-bear.  Declined brace/crutches at this time.  X-ray tomorrow.  Differential includes patellar fracture.  Some improvement to this time.  Significant effusion.  Consider MRI if negative x-ray versus orthopedic eval  Essential hypertension - Plan: amLODipine (NORVASC) 10 MG tablet  -Stable, continue same regimen  Reactive depression - Plan: sertraline (ZOLOFT) 100 MG tablet  -Stable on current dosing, continue same as well as support groups.  Screening for hyperlipidemia - Plan: Comprehensive metabolic panel, Lipid panel  Hyperglycemia - Plan: Hemoglobin A1c  -Slight hyperglycemia on recent labs, check A1c on labs visit  tomorrow  Meds ordered this encounter  Medications   amLODipine (NORVASC) 10 MG tablet    Sig: Take 1 tablet (10 mg total) by mouth daily.    Dispense:  90 tablet    Refill:  2   sertraline (ZOLOFT) 100 MG tablet    Sig: Take 1 tablet (100 mg total) by mouth at bedtime.    Dispense:  90 tablet    Refill:   2   Patient Instructions  [facility] and [facility] at [facility] tomorrow. I will let you know if any concerns. No changes for now. Thanks for coming in today and let me know if there are questions.  Return to the clinic or go to the nearest emergency room if any of your symptoms worsen or new symptoms occur.    Signed,   Merri Ray, MD [place] 05/20/21 7:23 PM

## 2021-05-21 ENCOUNTER — Other Ambulatory Visit (INDEPENDENT_AMBULATORY_CARE_PROVIDER_SITE_OTHER): Payer: 59

## 2021-05-21 ENCOUNTER — Ambulatory Visit (INDEPENDENT_AMBULATORY_CARE_PROVIDER_SITE_OTHER)
Admission: RE | Admit: 2021-05-21 | Discharge: 2021-05-21 | Disposition: A | Discharge status: Home / Self Care | Payer: 59 | Source: Ambulatory Visit | Attending: Family Medicine | Admitting: Family Medicine

## 2021-05-21 DIAGNOSIS — M25462 Effusion, left knee: Secondary | ICD-10-CM | POA: Diagnosis not present

## 2021-05-21 DIAGNOSIS — R739 Hyperglycemia, unspecified: Secondary | ICD-10-CM | POA: Diagnosis not present

## 2021-05-21 DIAGNOSIS — Z13220 Encounter for screening for lipoid disorders: Secondary | ICD-10-CM

## 2021-05-21 DIAGNOSIS — M25562 Pain in left knee: Secondary | ICD-10-CM

## 2021-05-21 LAB — COMPREHENSIVE METABOLIC PANEL
ALT: 16 U/L (ref 0–53)
AST: 20 U/L (ref 0–37)
Albumin: 4.3 g/dL (ref 3.5–5.2)
Alkaline Phosphatase: 122 U/L — ABNORMAL HIGH (ref 39–117)
BUN: 17 mg/dL (ref 6–23)
CO2: 29 mEq/L (ref 19–32)
Calcium: 9.1 mg/dL (ref 8.4–10.5)
Chloride: 104 mEq/L (ref 96–112)
Creatinine, Ser: 1 mg/dL (ref 0.40–1.50)
GFR: 80.83 mL/min (ref >60.00)
Glucose, Bld: 104 mg/dL — ABNORMAL HIGH (ref 70–99)
Potassium: 4.3 mEq/L (ref 3.5–5.1)
Sodium: 140 mEq/L (ref 135–145)
Total Bilirubin: 0.5 mg/dL (ref 0.2–1.2)
Total Protein: 7.5 g/dL (ref 6.0–8.3)

## 2021-05-21 LAB — LIPID PANEL
Cholesterol: 131 mg/dL (ref 0–200)
HDL: 47.9 mg/dL (ref >39.00)
LDL Cholesterol: 67 mg/dL (ref 0–99)
NonHDL: 82.81
Total CHOL/HDL Ratio: 3
Triglycerides: 81 mg/dL (ref 0.0–149.0)
VLDL: 16.2 mg/dL (ref 0.0–40.0)

## 2021-05-21 LAB — HEMOGLOBIN A1C: Hgb A1c MFr Bld: 5 % (ref 4.6–6.5)

## 2021-05-21 IMAGING — DX DG LUMBAR SPINE COMPLETE 4+V
5 series · 5 of 5 positions shown · non-contrast
Comparison: None.

CLINICAL DATA: 59-year-old male with fall and back pain.

EXAM:
LUMBAR SPINE - COMPLETE 4+ VIEW

[l-spine ap]
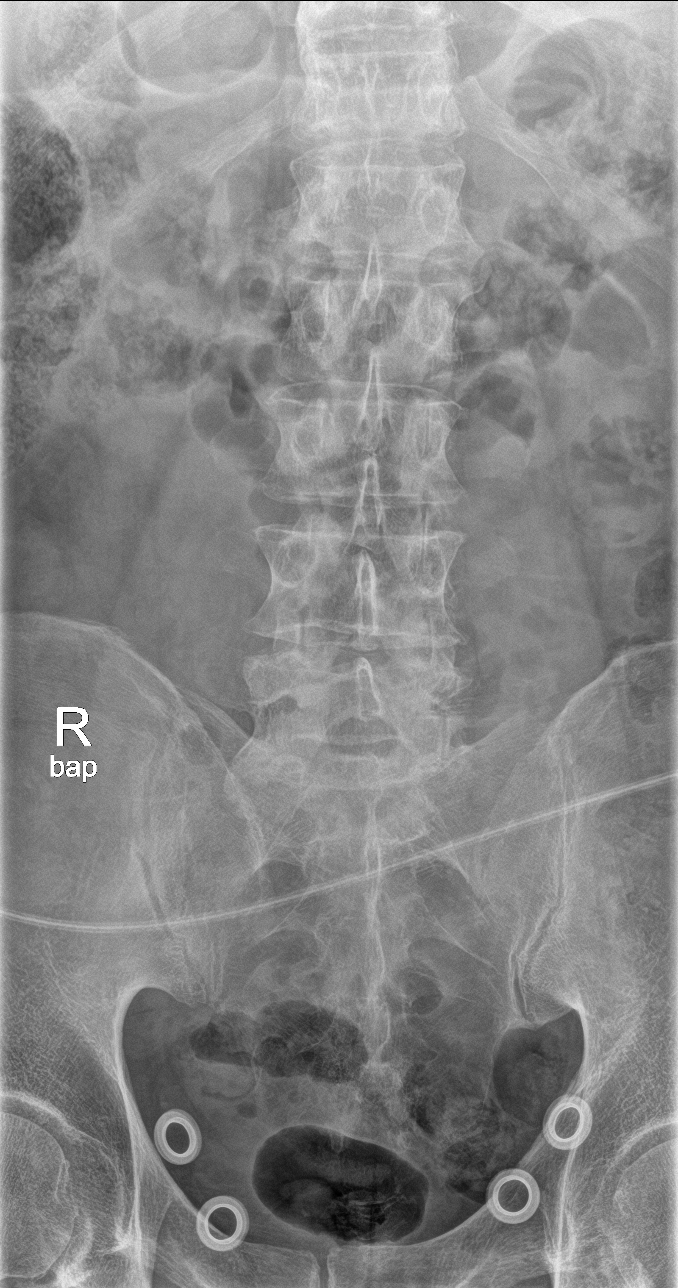

[l-spine obl (1 of 2)]
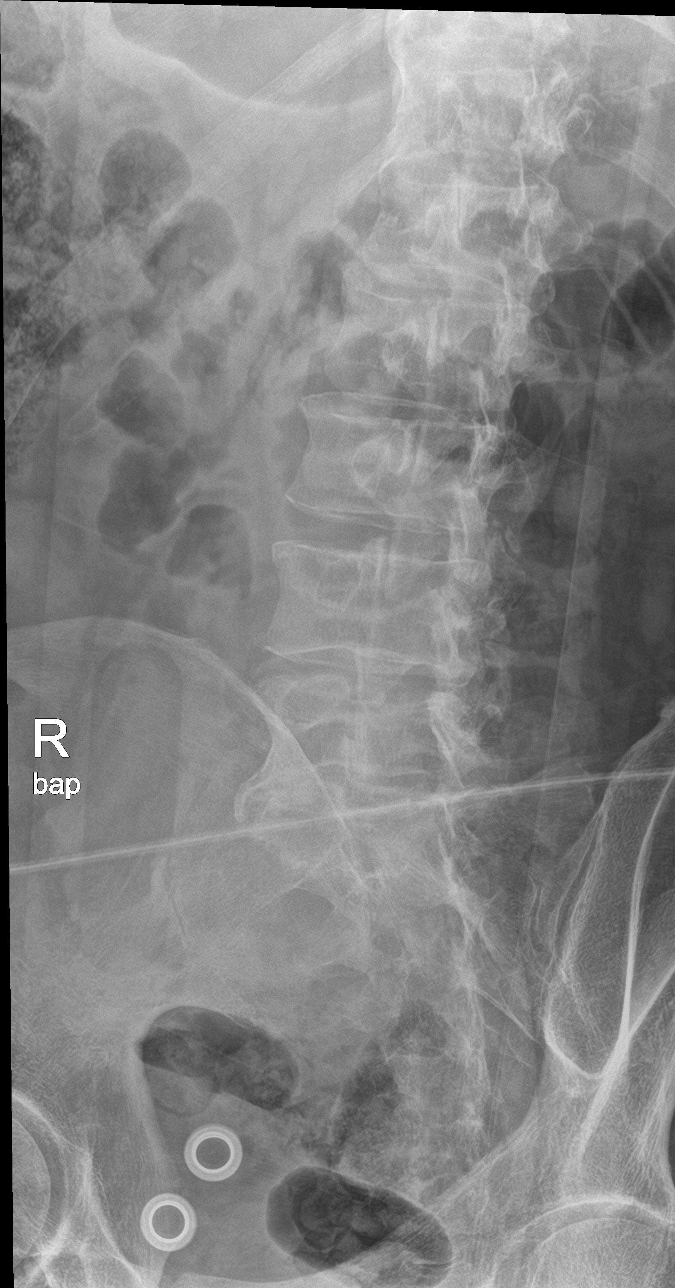

[l-spine obl (2 of 2)]
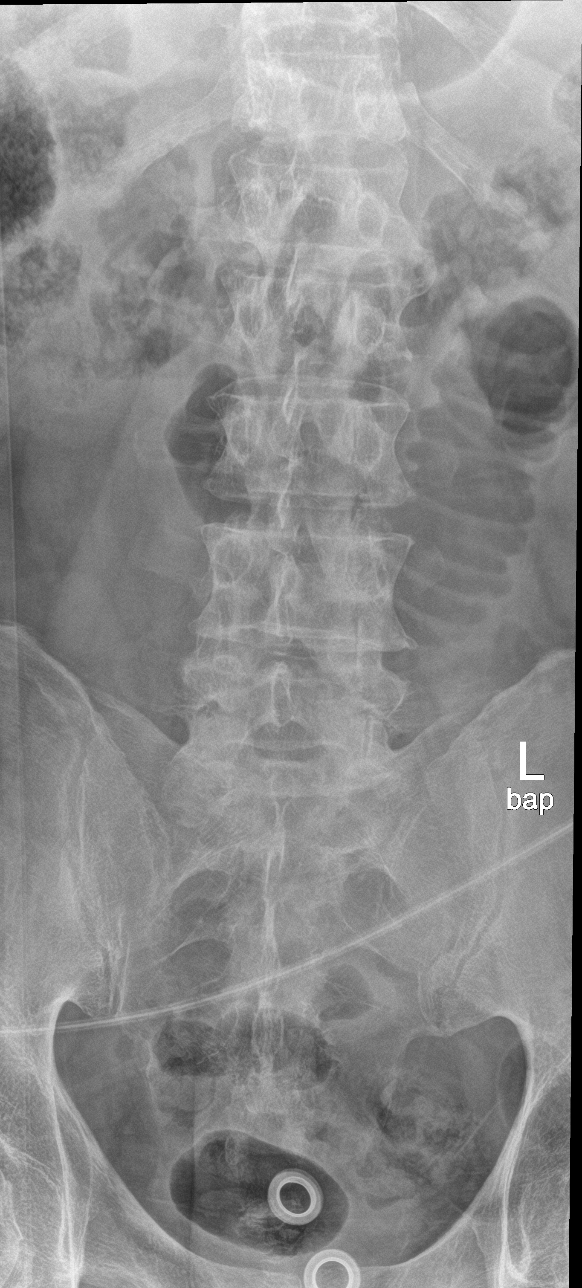

[l-spine lat]
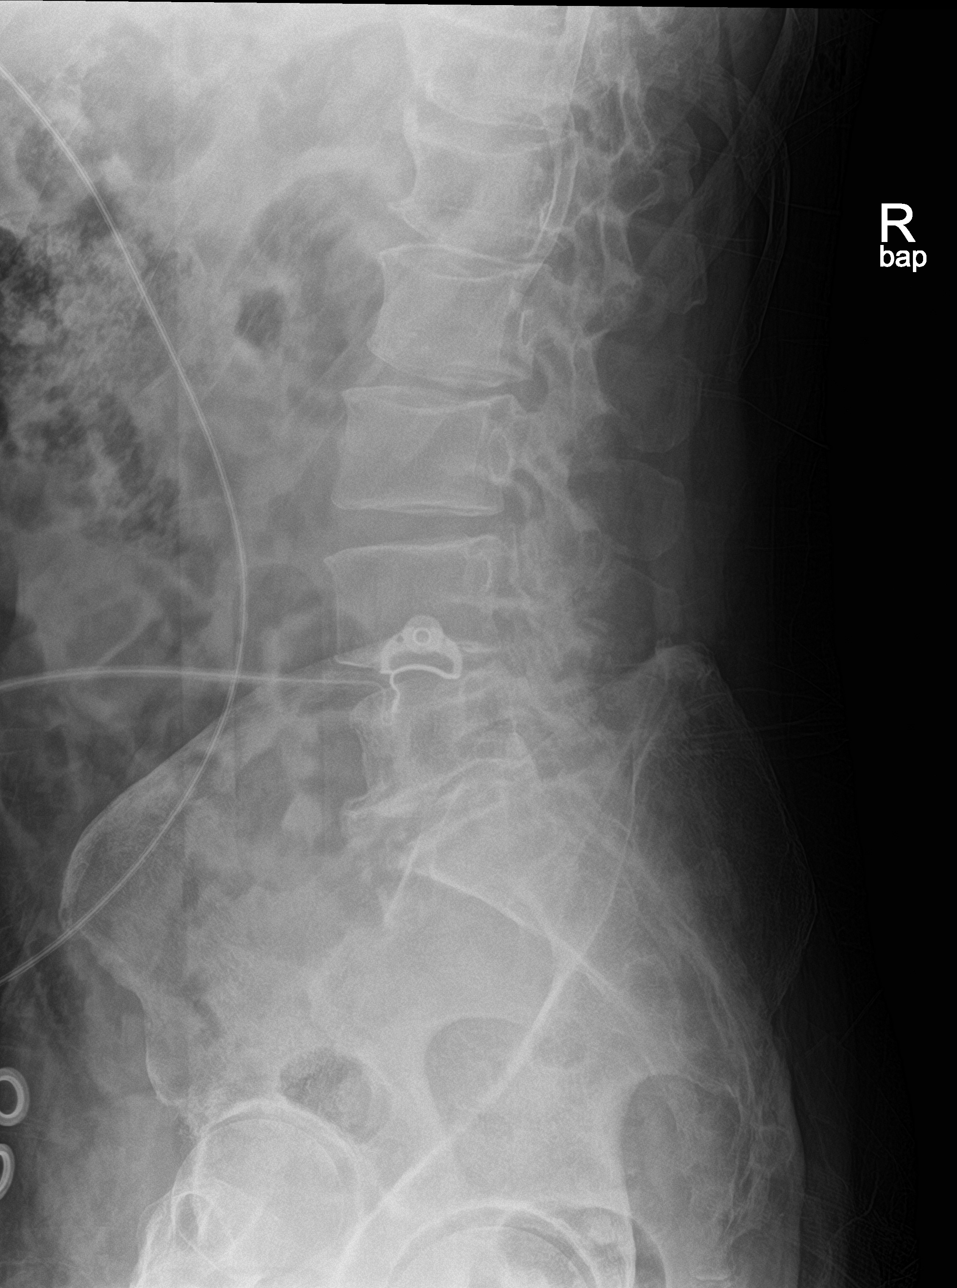

[l-spine spot]
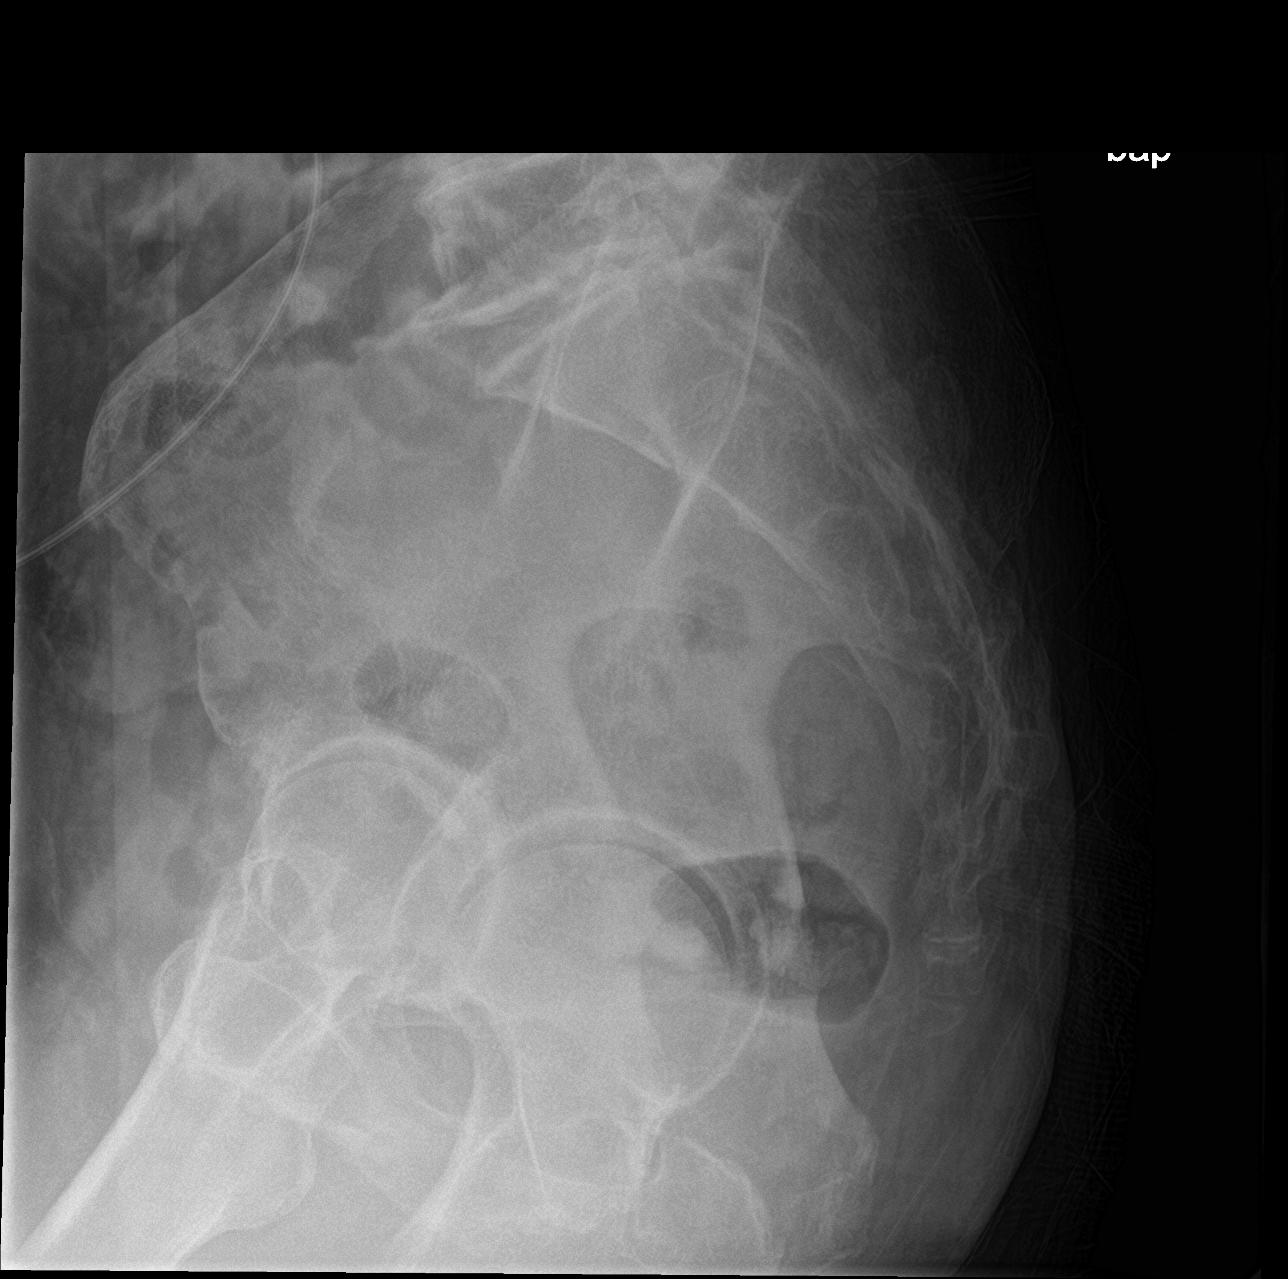

[5 of 5 positions shown; findings below may reference images not displayed]

FINDINGS: Five lumbar type vertebra. Chronic appearing compression fracture of
L1 with approximately 40% loss of vertebral body height. Correlation
with clinical exam and point tenderness recommended. No acute
fracture or subluxation. Multilevel degenerative changes. The
visualized posterior elements are intact. The soft tissues are
unremarkable.
IMPRESSION: 1. No acute fracture or subluxation.
2. Chronic appearing compression fracture of L1.

## 2021-05-22 ENCOUNTER — Encounter: Payer: Self-pay | Admitting: Family Medicine

## 2021-05-23 IMAGING — DX DG KNEE COMPLETE 4+V*L*
2 series · 2 of 2 positions shown · non-contrast
Comparison: None.

CLINICAL DATA: Bilateral knee pain after a fall.

EXAM:
LEFT KNEE - COMPLETE 4+ VIEW

[knee ap]
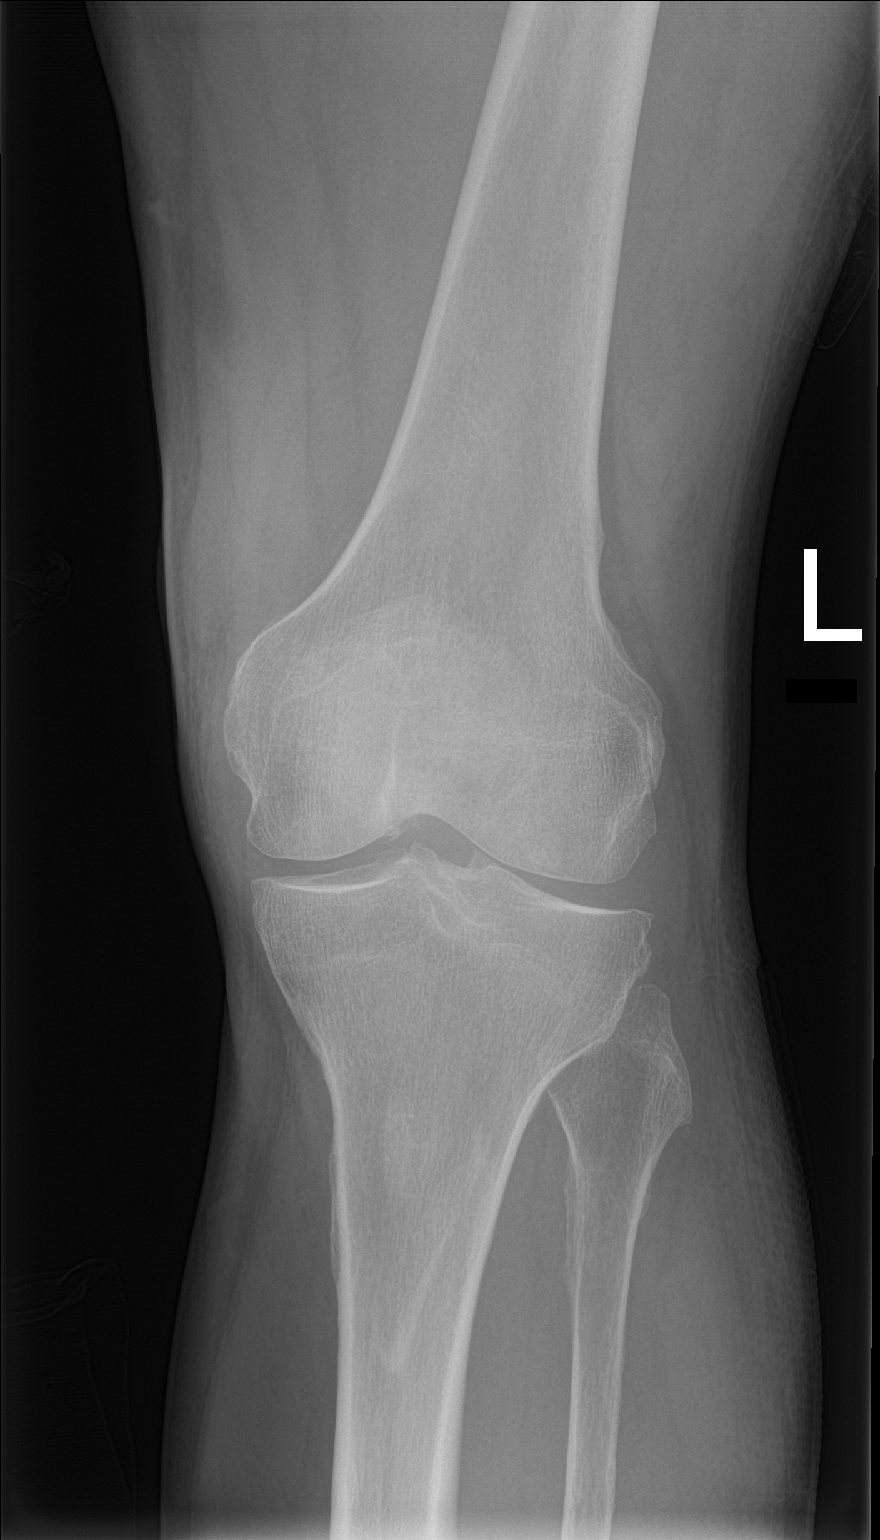

[knee lat]
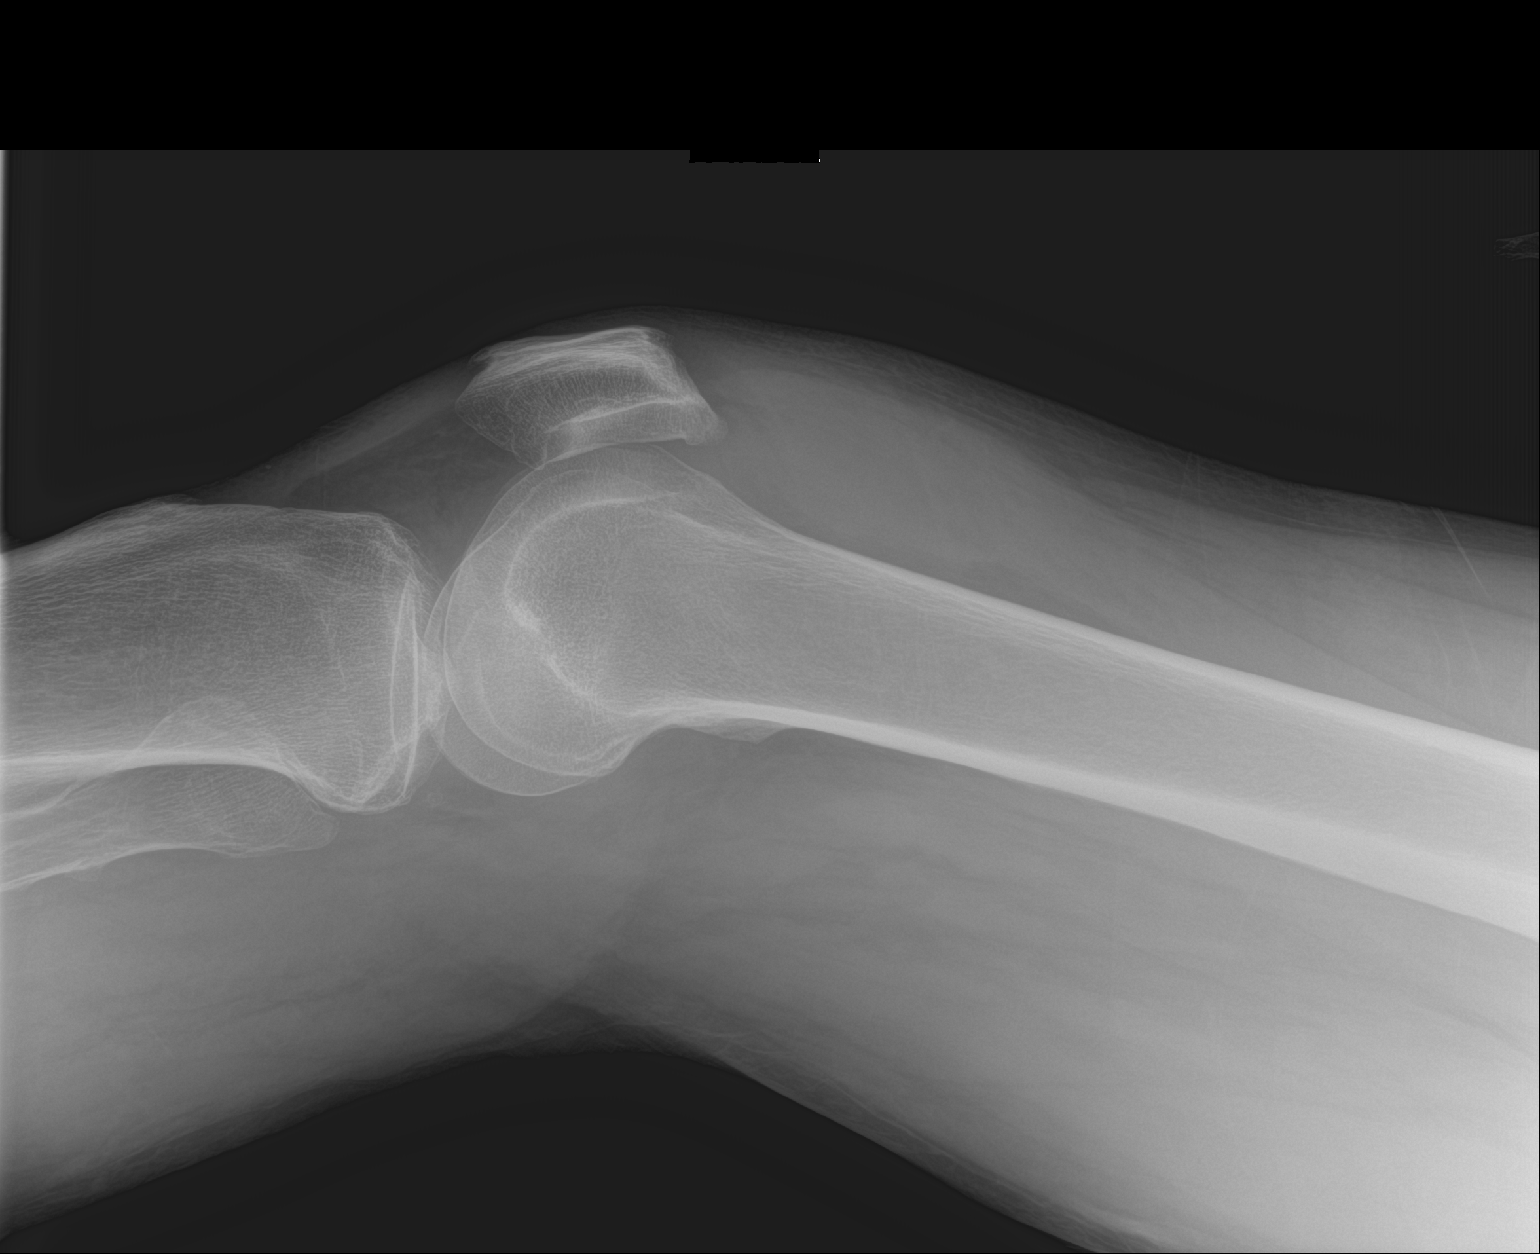

[2 of 2 positions shown; findings below may reference images not displayed]

FINDINGS: No evidence of fracture or dislocation. There is a moderate knee
joint effusion. Mild tricompartmental osteoarthritis is noted.
Superior and inferior patellar enthesophytes are noted.
IMPRESSION: 1. Moderate knee joint effusion. No evidence of fracture or
dislocation.
2. Mild tricompartmental osteoarthritis.

## 2021-05-23 IMAGING — DX DG KNEE COMPLETE 4+V*R*
2 series · 2 of 2 positions shown · non-contrast
Comparison: None.

CLINICAL DATA: Bilateral knee pain after a fall.

EXAM:
RIGHT KNEE - COMPLETE 4+ VIEW

[knee ap]
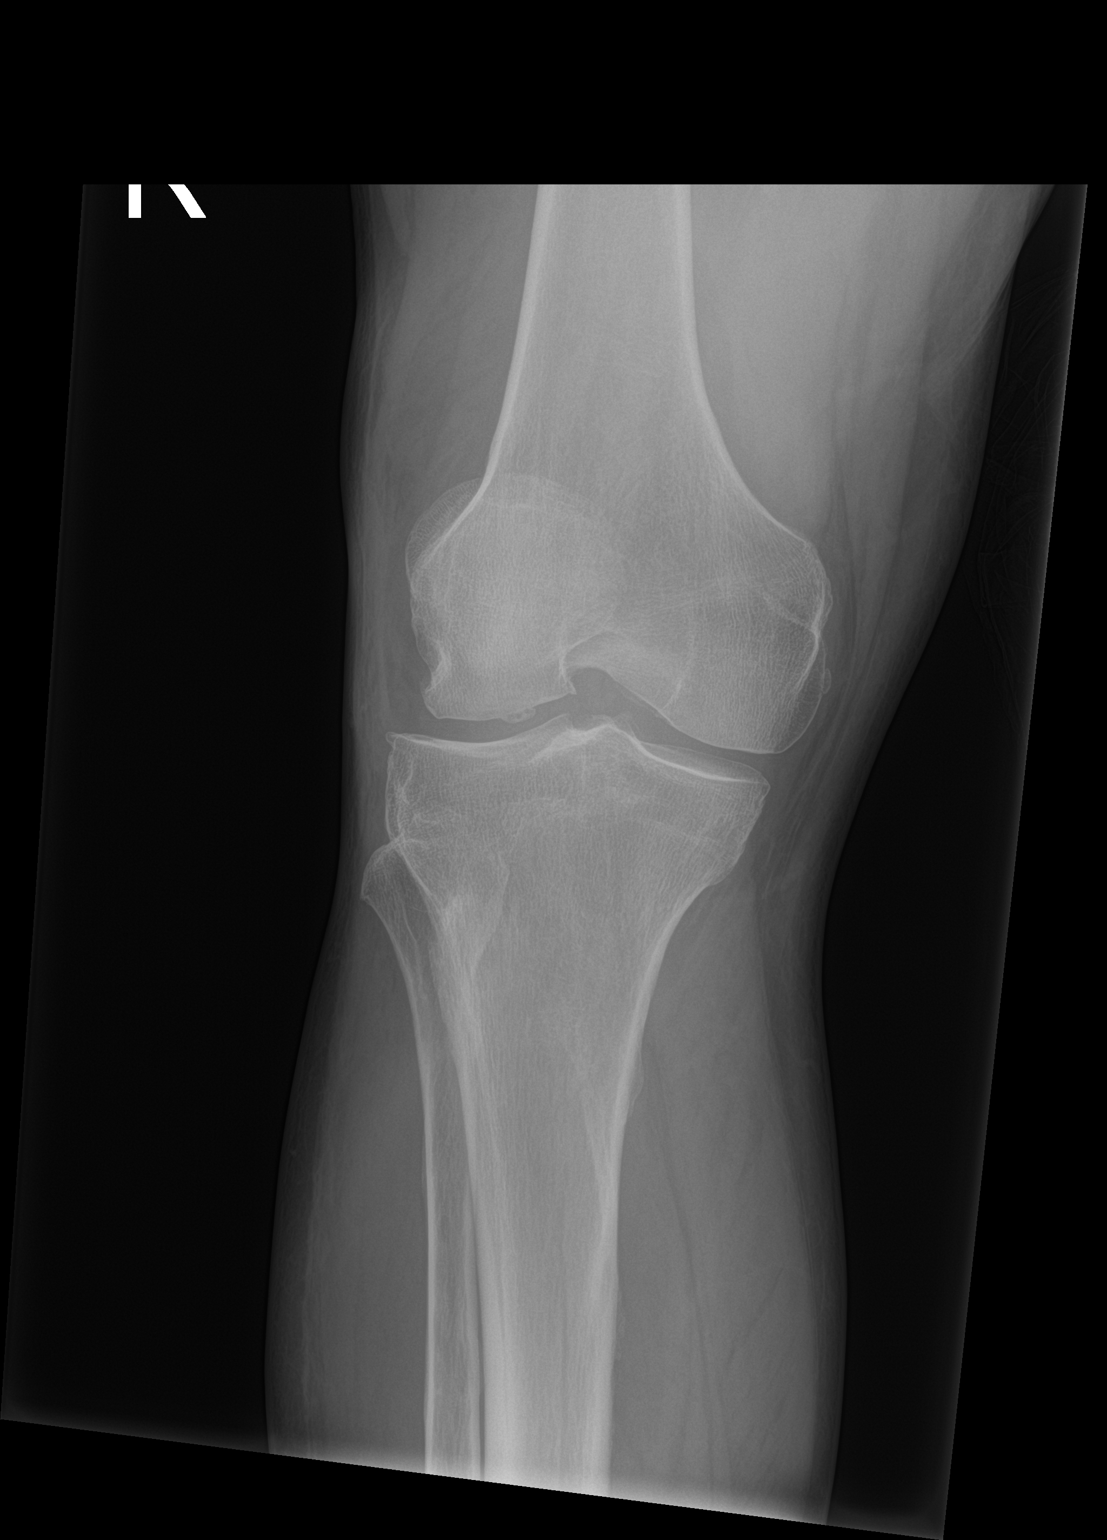

[knee lat]
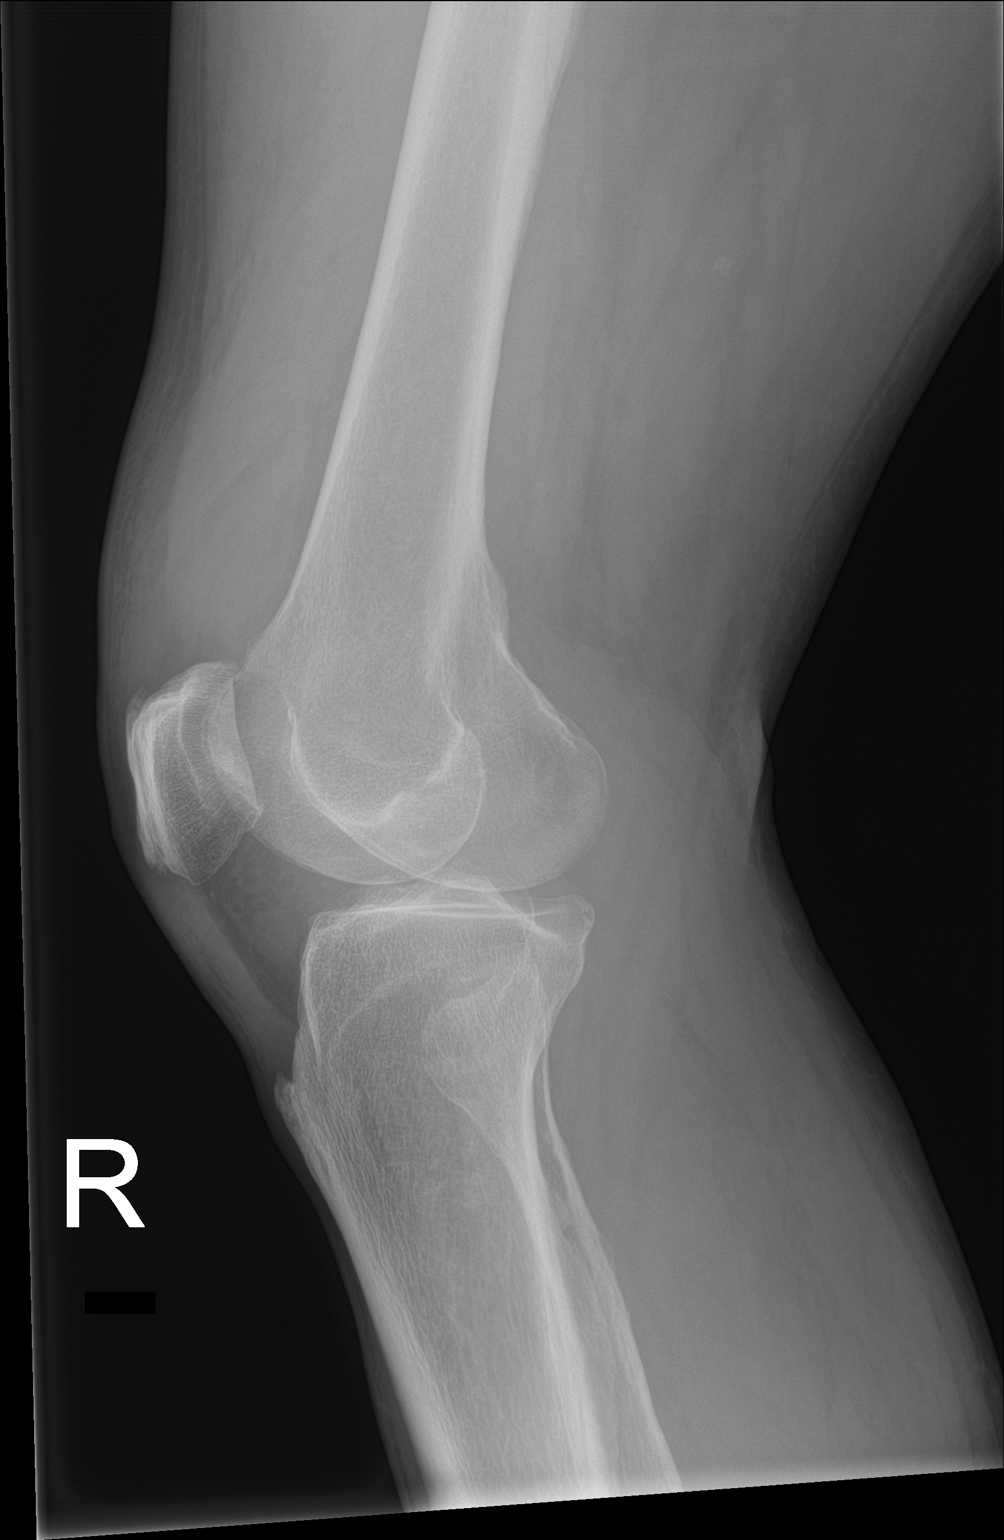

[2 of 2 positions shown; findings below may reference images not displayed]

FINDINGS: No evidence of fracture or dislocation. There is a moderate knee
joint effusion. Mild tricompartmental osteoarthritis is noted.
Superior and inferior patellar enthesophytes are noted.
IMPRESSION: 1. Moderate knee joint effusion. No evidence of fracture or
dislocation.
2. Mild tricompartmental osteoarthritis.

## 2021-05-26 ENCOUNTER — Other Ambulatory Visit: Payer: Self-pay | Admitting: Family Medicine

## 2021-05-26 DIAGNOSIS — S82025A Nondisplaced longitudinal fracture of left patella, initial encounter for closed fracture: Secondary | ICD-10-CM

## 2021-05-26 NOTE — Progress Notes (Signed)
See xray report. Vertical fracture, lateral. DOI 05/18/21. Symptoms improving, less swollen, less pain.  Using otc brace/compression sleeve. Will refer to ortho this week. Possible activity modification as treatment if marginal/vertical fx. Discussed plan with patient.

## 2021-05-26 NOTE — Telephone Encounter (Signed)
See result note. No radiology call received last week with fracture.  Discussed results and plan with patient and referred to ortho.

## 2021-05-28 ENCOUNTER — Ambulatory Visit (INDEPENDENT_AMBULATORY_CARE_PROVIDER_SITE_OTHER): Payer: 59 | Admitting: Orthopedic Surgery

## 2021-05-28 ENCOUNTER — Ambulatory Visit: Payer: 59 | Admitting: Orthopedic Surgery

## 2021-05-28 ENCOUNTER — Other Ambulatory Visit: Payer: Self-pay

## 2021-05-28 DIAGNOSIS — S82025A Nondisplaced longitudinal fracture of left patella, initial encounter for closed fracture: Secondary | ICD-10-CM

## 2021-06-08 ENCOUNTER — Encounter: Payer: Self-pay | Admitting: Orthopedic Surgery

## 2021-06-08 NOTE — Progress Notes (Signed)
Office Visit Note   Patient: Aaron [name]           Date of Birth: [birth year]           MRN: [id number] Visit Date: 05/28/2021              Requested by: [name], MD [address] [place],  [place] [address] PCP: [name], MD  Chief Complaint  Patient presents with   Left Knee - Pain    S/p fall direct impact to knee DOI 05/15/21      HPI: Patient is a 60 year old gentleman who is seen for initial evaluation for a vertical nondisplaced left patella fracture.  Patient states the injury occurred on 05/15/2021.  Patient states he was carrying boxes and did not brace his fall and landed directly on his left patella.  Assessment & Plan: Visit Diagnoses:  1. Closed nondisplaced longitudinal fracture of left patella, initial encounter     Plan: Patient will start isometric straight leg raises for strengthening.  We will start gentle range of motion and may advance to strengthening exercises about 4 weeks.  2 view radiographs of the left patella at follow-up.  Follow-Up Instructions: Return in about 4 weeks (around 06/25/2021).   Ortho Exam  Patient is alert, oriented, no adenopathy, well-dressed, normal affect, normal respiratory effort. Examination of the left knee radiograph shows a vertical nondisplaced patella fracture of the lateral 1 cm.  Patient can do an isometric straight leg raise without extensor lag there is some swelling there is no tenderness to palpation there is abrasions over the left knee.  Imaging: No results found. No images are attached to the encounter.  Labs: Lab Results  Component Value Date   HGBA1C 5.0 05/21/2021   ESRSEDRATE 47 (H) 07/16/2009   LABURIC 7.4 11/20/2020   LABURIC 6.7 07/03/2020     Lab Results  Component Value Date   ALBUMIN 4.3 05/21/2021   ALBUMIN 4.0 01/23/2021   ALBUMIN 4.6 07/03/2020    No results found for: MG No results found for: VD25OH  No results found for: PREALBUMIN CBC EXTENDED Latest  Ref Rng & Units 01/27/2021 01/24/2021 01/23/2021  WBC 4.0 - 10.5 K/uL 7.3 11.5(H) 9.7  RBC 4.22 - 5.81 MIL/uL 3.52(L) 3.85(L) 4.24  HGB 13.0 - 17.0 g/dL 11.3(L) 12.4(L) 13.7  HCT 39.0 - 52.0 % 34.0(L) 37.9(L) 42.2  PLT 150 - 400 K/uL 206 232 281  NEUTROABS 1.7 - 7.7 K/uL - - 6.8  LYMPHSABS 0.7 - 4.0 K/uL - - 1.9     There is no height or weight on file to calculate BMI.  Orders:  No orders of the defined types were placed in this encounter.  No orders of the defined types were placed in this encounter.    Procedures: No procedures performed  Clinical Data: No additional findings.  ROS:  All other systems negative, except as noted in the HPI. Review of Systems  Objective: Vital Signs: There were no vitals taken for this visit.  Specialty Comments:  No specialty comments available.  PMFS History: Patient Active Problem List   Diagnosis Date Noted   Pneumothorax, left 01/24/2021   ADHD, hyperactive-impulsive type 04/30/2020   Circadian rhythm sleep disorder, delayed sleep phase type 11/21/2019   Adult residual type attention deficit hyperactivity disorder (ADHD) 11/21/2019   Galactosemia ([place]) 11/21/2019   Dermatitis herpetiformis 09/22/2019   ADHD    Iron deficiency anemia 08/25/2015   Insomnia 01/09/2015  Sleep apnea syndrome    RLS (restless legs syndrome)    GERD 08/19/2009   GASTRITIS 08/19/2009   CELIAC SPRUE 07/23/2009   DIARRHEA 07/02/2009   INSOMNIA-SLEEP DISORDER-UNSPEC 06/30/2009   ABDOMINAL PAIN OTHER SPECIFIED SITE 06/30/2009   DEPRESSION 06/05/2008   OBSTRUCTIVE SLEEP APNEA 06/05/2008   RESTLESS LEGS SYNDROME 06/05/2008   Past Medical History:  Diagnosis Date   ADHD    Celiac disease    GERD (gastroesophageal reflux disease)    RLS (restless legs syndrome)    Sleep apnea syndrome    tested  under [facility],  12-2-- 2004    Family History  Problem Relation Age of Onset   Hypertension Mother    Diabetes Mother     Hypertension Father    Heart disease Maternal Grandmother    Heart disease Maternal Grandfather    Heart disease Paternal Grandfather    Colon cancer Neg Hx    Colon polyps Neg Hx    Kidney disease Neg Hx    Esophageal cancer Neg Hx     Past Surgical History:  Procedure Laterality Date   APPENDECTOMY     HERNIA REPAIR     Social History   Occupational History   Occupation: Freight forwarder  Tobacco Use   Smoking status: Never   Smokeless tobacco: Never  Substance and Sexual Activity   Alcohol use: Yes    Alcohol/week: 7.0 standard drinks    Types: 7 Standard drinks or equivalent per week    Comment: Occassionally   Drug use: No   Sexual activity: Yes

## 2021-06-29 ENCOUNTER — Telehealth: Payer: Self-pay | Admitting: Neurology

## 2021-06-29 ENCOUNTER — Encounter: Payer: Self-pay | Admitting: Neurology

## 2021-06-29 NOTE — Telephone Encounter (Signed)
Pt called requesting refill for lisdexamfetamine (VYVANSE) 20 MG capsule. Pharmacy [phone].

## 2021-06-29 NOTE — Telephone Encounter (Signed)
Pt has not been seen since 04/2020. He has an upcoming scheduled for December but given this is a controlled substance medication we would need to have the patient seen sooner. The visit can be a mychart VV or in office. Can be with MD or NP. I have placed a slot on hold tomorrow at 1 pm for the patient for VV with Hedwig Morton. I have reached out to the pt on mychart asking him to contact us

## 2021-06-30 ENCOUNTER — Other Ambulatory Visit: Payer: Self-pay | Admitting: Neurology

## 2021-06-30 MED ORDER — LISDEXAMFETAMINE DIMESYLATE 20 MG PO CAPS: 20.0000 mg | ORAL_CAPSULE | Freq: Every day | ORAL | 0 refills | Status: DC | PRN

## 2021-06-30 NOTE — Telephone Encounter (Signed)
Pt is scheduled for an October apt( soonest pt could get in) previous apt was moved because NP was out of the office. Medication is due and will send to the pharmacy for the patient. Pt must keep October apt or a sooner apt for future refills.

## 2021-08-03 ENCOUNTER — Other Ambulatory Visit: Payer: Self-pay | Admitting: Neurology

## 2021-08-03 MED ORDER — LISDEXAMFETAMINE DIMESYLATE 20 MG PO CAPS: 20.0000 mg | ORAL_CAPSULE | Freq: Every day | ORAL | 0 refills | Status: DC | PRN

## 2021-08-03 NOTE — Telephone Encounter (Signed)
Pt request refill lisdexamfetamine (VYVANSE) 20 MG capsule  at [id number]

## 2021-08-03 NOTE — Telephone Encounter (Signed)
Patient is due for a refill on vyvanse. Patient has an upcoming appt next month. [place] Controlled Substance Registry checked and is appropriate.

## 2021-08-19 ENCOUNTER — Ambulatory Visit (INDEPENDENT_AMBULATORY_CARE_PROVIDER_SITE_OTHER): Payer: 59 | Admitting: Adult Health

## 2021-08-19 ENCOUNTER — Encounter: Payer: Self-pay | Admitting: Adult Health

## 2021-08-19 ENCOUNTER — Encounter: Payer: Self-pay | Admitting: *Deleted

## 2021-08-19 VITALS — BP 124/83 | HR 64 | Ht 72.0 in | Wt 208.6 lb

## 2021-08-19 DIAGNOSIS — J9811 Atelectasis: Secondary | ICD-10-CM | POA: Insufficient documentation

## 2021-08-19 DIAGNOSIS — F901 Attention-deficit hyperactivity disorder, predominantly hyperactive type: Secondary | ICD-10-CM

## 2021-08-19 DIAGNOSIS — G2581 Restless legs syndrome: Secondary | ICD-10-CM | POA: Diagnosis not present

## 2021-08-19 DIAGNOSIS — S2249XA Multiple fractures of ribs, unspecified side, initial encounter for closed fracture: Secondary | ICD-10-CM | POA: Insufficient documentation

## 2021-08-19 MED ORDER — TRAZODONE HCL 50 MG PO TABS: 50.0000 mg | ORAL_TABLET | Freq: Every day | ORAL | 3 refills | Status: DC

## 2021-08-19 NOTE — Progress Notes (Signed)
PATIENT: Aaron [name] DOB: 1961/04/15  REASON FOR VISIT: follow up HISTORY FROM: patient PRIMARY NEUROLOGIST: [name]  HISTORY OF PRESENT ILLNESS: Today 08/19/21:  Mr. rounds is a 60 year old male with a history of insomnia, restless legs, and ADHD.  He returns today for follow-up.  He reports that he has been doing well with trazodone at bedtime.  He states that he does not want to be on any medication that is addictive.  Reports that he has good nights and bad nights.  This has been an ongoing issue for him.  He reports that his restless legs are not bothersome.  He continues on Vyvanse for ADHD.  Has tried multiple other medications in the past and this gives him the best results.  He returns today for an evaluation.  HISTORY (Copied from Dr.Dohmeier's note) Aaron [name] is a 60 y.o. male, seen here as a revisit for RLS He also has a new concern about circadian rhythym changes and ADHD.  The patient reports that he has always had problems to sleep well and continuously for a long time but he has begun to push his bedtime more more into the morning hours and accordingly his circadian rhythm has shifted.  Since he is also treated for ADHD and restless legs there may be some medication side effects that also influence this pattern his daytime sleepiness was endorsed on the Epworth scale at 12 points he feels subjectively that his restless legs have gotten better but that he also goes to bed later because he feels he has the highest level of productivity around midnight and later.  He is also a Physicist, medical and so when he reaches a certain workflow he likes to " ride the wave '. He naps in daytime.  He remains physically active. It ADHD got worse in his 10s.  REVIEW OF SYSTEMS: Out of a complete 14 system review of symptoms, the patient complains only of the following symptoms, and all other reviewed systems are negative.  ALLERGIES: Allergies  Allergen Reactions   Gluten  Meal Other (See Comments)    HOME MEDICATIONS: Outpatient Medications Prior to Visit  Medication Sig Dispense Refill   acetaminophen (TYLENOL) 500 MG tablet Take 2 tablets (1,000 mg total) by mouth every 6 (six) hours as needed for mild pain. 30 tablet 0   amLODipine (NORVASC) 10 MG tablet Take 1 tablet (10 mg total) by mouth daily. 90 tablet 2   dapsone 25 MG tablet Take 2 tablets (50 mg total) by mouth 2 (two) times daily. 180 tablet 1   ferrous sulfate 325 (65 FE) MG tablet Take 325 mg by mouth daily. 2 times weekly     fish oil-omega-3 fatty acids 1000 MG capsule Take 2 g by mouth 2 (two) times a week.      ibuprofen (ADVIL) 600 MG tablet Take 1 tablet (600 mg total) by mouth every 6 (six) hours as needed for mild pain or moderate pain. 30 tablet 0   indomethacin (INDOCIN) 25 MG capsule Take 1 capsule (25 mg total) by mouth 3 (three) times daily as needed (for gout flare with food.). 30 capsule 0   lisdexamfetamine (VYVANSE) 20 MG capsule Take 1 capsule (20 mg total) by mouth daily as needed (adhd). 30 capsule 0   MITIGARE 0.6 MG CAPS TAKE 2 CAPSULES BY MOUTH AT ONSET OF GOUT FLARE, 1 ADDITIONAL CAPSULE IN 1 HOUR IF NEEDED (Patient taking differently: Take 2 capsules by mouth See admin instructions. At onset  of gout flares) 15 capsule 1   sertraline (ZOLOFT) 100 MG tablet Take 1 tablet (100 mg total) by mouth at bedtime. 90 tablet 2   traZODone (DESYREL) 50 MG tablet TAKE 1 TABLET(50 MG) BY MOUTH AT BEDTIME AS NEEDED FOR SLEEP (Patient taking differently: Take 50 mg by mouth at bedtime.) 90 tablet 3   No facility-administered medications prior to visit.    PAST MEDICAL HISTORY: Past Medical History:  Diagnosis Date   ADHD    Broken ribs    Celiac disease    Collapse of left lung    GERD (gastroesophageal reflux disease)    RLS (restless legs syndrome)    Sleep apnea syndrome    [facility]  under [facility],  12-2-- 2004    PAST SURGICAL HISTORY: Past Surgical History:   Procedure Laterality Date   APPENDECTOMY     HERNIA REPAIR      FAMILY HISTORY: Family History  Problem Relation Age of Onset   Hypertension Mother    Diabetes Mother    Hypertension Father    Heart disease Maternal Grandmother    Heart disease Maternal Grandfather    Heart disease Paternal Grandfather    Colon cancer Neg Hx    Colon polyps Neg Hx    Kidney disease Neg Hx    Esophageal cancer Neg Hx     SOCIAL HISTORY: Social History   Socioeconomic History   Marital status: Married    Spouse name: La Rose   Number of children: 3   Years of education: Not on file   Highest education level: Not on file  Occupational History   Occupation: Freight forwarder  Tobacco Use   Smoking status: Never   Smokeless tobacco: Never  Vaping Use   Vaping Use: Never used  Substance and Sexual Activity   Alcohol use: Yes    Alcohol/week: 7.0 standard drinks    Types: 7 Standard drinks or equivalent per week    Comment: Occassionally   Drug use: No   Sexual activity: Yes  Other Topics Concern   Not on file  Social History Narrative   Patient lives at home with wife [name].    Patient has 3 children.    Patient has a BA.    Social Determinants of Health   Financial Resource Strain: Not on file  Food Insecurity: Not on file  Transportation Needs: Not on file  Physical Activity: Not on file  Stress: Not on file  Social Connections: Not on file  Intimate Partner Violence: Not on file      PHYSICAL EXAM  Vitals:   08/19/21 1504  BP: 124/83  Pulse: 64  Weight: 208 lb 9.6 oz (94.6 kg)  Height: 6' (1.829 m)   Body mass index is 28.29 kg/m.  Generalized: Well developed, in no acute distress   Neurological examination  Mentation: Alert oriented to time, place, history taking. Follows all commands speech and language fluent Cranial nerve II-XII: Pupils were equal round reactive to light. Extraocular movements were full, visual field were full on confrontational test. Facial  sensation and strength were normal.  Motor: The motor testing reveals 5 over 5 strength of all 4 extremities. Good symmetric motor tone is noted throughout.  Sensory: Sensory testing is intact to soft touch on all 4 extremities. No evidence of extinction is noted.  Coordination: Cerebellar testing reveals good finger-nose-finger and heel-to-shin bilaterally.  Gait and station: Gait is normal.  Reflexes: Deep tendon reflexes are symmetric and normal bilaterally.  DIAGNOSTIC DATA (LABS, IMAGING, TESTING) - I reviewed patient records, labs, notes, testing and imaging myself where available.  Lab Results  Component Value Date   WBC 7.3 01/27/2021   HGB 11.3 (L) 01/27/2021   HCT 34.0 (L) 01/27/2021   MCV 96.6 01/27/2021   PLT 206 01/27/2021      Component Value Date/Time   NA 140 05/21/2021 1641   NA 140 11/20/2020 1806   K 4.3 05/21/2021 1641   CL 104 05/21/2021 1641   CO2 29 05/21/2021 1641   GLUCOSE 104 (H) 05/21/2021 1641   BUN 17 05/21/2021 1641   BUN 11 11/20/2020 1806   CREATININE 1.00 05/21/2021 1641   CREATININE 1.16 07/20/2013 1004   CALCIUM 9.1 05/21/2021 1641   PROT 7.5 05/21/2021 1641   PROT 7.2 07/03/2020 1815   ALBUMIN 4.3 05/21/2021 1641   ALBUMIN 4.6 07/03/2020 1815   AST 20 05/21/2021 1641   ALT 16 05/21/2021 1641   ALKPHOS 122 (H) 05/21/2021 1641   BILITOT 0.5 05/21/2021 1641   BILITOT 0.5 07/03/2020 1815   GFRNONAA >60 01/27/2021 0336   GFRAA 87 11/20/2020 1806   Lab Results  Component Value Date   CHOL 131 05/21/2021   HDL 47.90 05/21/2021   LDLCALC 67 05/21/2021   TRIG 81.0 05/21/2021   CHOLHDL 3 05/21/2021   Lab Results  Component Value Date   HGBA1C 5.0 05/21/2021   No results found for: VDIXVEZB01 Lab Results  Component Value Date   TSH 1.840 11/20/2020      ASSESSMENT AND PLAN 60 y.o. year old male  has a past medical history of ADHD, Broken ribs, Celiac disease, Collapse of left lung, GERD (gastroesophageal reflux disease), RLS  (restless legs syndrome), and Sleep apnea syndrome. here with :  1.  ADHD 2.  Restless leg syndrome 3.  Insomnia  --Continue trazodone 50 mg at bedtime --Continue Vyvanse 20 mg daily as needed --Follow-up in 6 months or sooner if needed     Ward Givens, MSN, NP-C 08/19/2021, 3:11 PM [facility] [address] [place],  [address] [phone] '

## 2021-09-21 ENCOUNTER — Other Ambulatory Visit: Payer: Self-pay | Admitting: Neurology

## 2021-09-21 MED ORDER — LISDEXAMFETAMINE DIMESYLATE 20 MG PO CAPS: 20.0000 mg | ORAL_CAPSULE | Freq: Every day | ORAL | 0 refills | Status: DC | PRN

## 2021-09-21 NOTE — Telephone Encounter (Signed)
Pt requesting refill for lisdexamfetamine (VYVANSE) 20 MG capsule. Pharmacy [facility], [address] AT [place].

## 2021-09-21 NOTE — Telephone Encounter (Signed)
Per [place] registry, last filled on 08/18/2021 #30/30. Pt up to date on appointments. Refill request sent to MM NP.

## 2021-10-14 ENCOUNTER — Ambulatory Visit: Payer: 59 | Admitting: Dermatology

## 2021-10-20 ENCOUNTER — Other Ambulatory Visit: Payer: Self-pay | Admitting: Adult Health

## 2021-10-20 MED ORDER — LISDEXAMFETAMINE DIMESYLATE 20 MG PO CAPS: 20.0000 mg | ORAL_CAPSULE | Freq: Every day | ORAL | 0 refills | Status: DC | PRN

## 2021-10-20 NOTE — Telephone Encounter (Signed)
Pt is up to date on his appts. Pending appt April 2023. Per [place] registry, last filled Vyvanse 20 mg on 09/21/21 as a 30 day supply. Rx refill sent to MM NP.

## 2021-10-20 NOTE — Telephone Encounter (Signed)
Pt requesting refill for lisdexamfetamine (VYVANSE) 20 MG capsule. [place], [place]

## 2021-11-30 ENCOUNTER — Other Ambulatory Visit: Payer: Self-pay | Admitting: Adult Health

## 2021-11-30 NOTE — Telephone Encounter (Signed)
Pt last seen 08/19/2021. Next visit is 03/04/22. Per [place] registry, last filled Vyvanse 20 mg on 10/20/2021 #30/30. Refill sent to MM NP.

## 2021-11-30 NOTE — Telephone Encounter (Signed)
Pt is requesting a refill for lisdexamfetamine (VYVANSE) 20 MG capsule.  Pharmacy: [facility] [phone]

## 2021-12-01 MED ORDER — LISDEXAMFETAMINE DIMESYLATE 20 MG PO CAPS: 20.0000 mg | ORAL_CAPSULE | Freq: Every day | ORAL | 0 refills | Status: DC | PRN

## 2022-01-01 ENCOUNTER — Other Ambulatory Visit: Payer: Self-pay | Admitting: Adult Health

## 2022-01-01 NOTE — Telephone Encounter (Signed)
Pt called needing a refill request for his lisdexamfetamine (VYVANSE) 20 MG capsule sent to the [facility] on [address]

## 2022-01-04 MED ORDER — LISDEXAMFETAMINE DIMESYLATE 20 MG PO CAPS: 20.0000 mg | ORAL_CAPSULE | Freq: Every day | ORAL | 0 refills | Status: DC | PRN

## 2022-01-13 ENCOUNTER — Ambulatory Visit (INDEPENDENT_AMBULATORY_CARE_PROVIDER_SITE_OTHER): Payer: 59 | Admitting: Family Medicine

## 2022-01-13 ENCOUNTER — Encounter: Payer: Self-pay | Admitting: Family Medicine

## 2022-01-13 VITALS — BP 108/70 | HR 65 | Temp 97.8°F | Resp 17 | Ht 72.0 in | Wt 210.0 lb

## 2022-01-13 DIAGNOSIS — R5383 Other fatigue: Secondary | ICD-10-CM

## 2022-01-13 DIAGNOSIS — R42 Dizziness and giddiness: Secondary | ICD-10-CM

## 2022-01-13 DIAGNOSIS — R052 Subacute cough: Secondary | ICD-10-CM

## 2022-01-13 DIAGNOSIS — R0602 Shortness of breath: Secondary | ICD-10-CM

## 2022-01-13 MED ORDER — AZITHROMYCIN 250 MG PO TABS: ORAL_TABLET | ORAL | 0 refills | Status: AC

## 2022-01-13 NOTE — Patient Instructions (Addendum)
Increase fluids, rest, avoid exercise or exertion for now (like yardwork).  Mucinex if needed for cough. Start zpak antibiotic today, but have bloodwork and xray performed tomorrow morning if possible.  ?Make sure you advise the lab of previous lightheadedness with lab draws so that you can be watched temporarily after your lab. ? ?[facility] ?Walk in 8:30-4:30 during weekdays, no appointment needed ?[place]  ?[place], [place] [address] ? ? ?Recheck 1 week for cough and fatigue.  ? ?Return to the clinic or go to the nearest emergency room if any of your symptoms worsen or new symptoms occur. ? ?We should be able to discuss your chronic medications as well at next visit based on the lab work I ordered for tomorrow ? ?

## 2022-01-13 NOTE — Progress Notes (Signed)
Subjective:  Patient ID: Aaron [name], male    DOB: 1961-11-01  Age: 61 y.o. MRN: [id number]  CC:  Chief Complaint  Patient presents with   Cough    Pt reports cough starting week ago Sunday, reports possible fever, felt lightheaded at some points and fatigued,     HPI QUINZELL MALCOMB presents for    Cough: Initially started with cough, nasal congestion, fatigue, chest congestion, lightheadedness off and on. Initial nasal congestion has improved but still with subjective fever, most recently this am. No sick contacts. No home covid testing. Had initial covid vaccine. No booster.  No chest pains. No hx of CAD, CHF. No PND.  Yellow- green phlegm.  Lightheaded with trying to rake leaves and using lawn mower few days ago. Short of breath, tired. Felt weak overall. Drinking fluids.  No dark stools. No blood in stool noted.   History Patient Active Problem List   Diagnosis Date Noted   Broken ribs 08/19/2021   Collapse of left lung 08/19/2021   Pneumothorax, left 01/24/2021   ADHD, hyperactive-impulsive type 04/30/2020   Circadian rhythm sleep disorder, delayed sleep phase type 11/21/2019   Adult residual type attention deficit hyperactivity disorder (ADHD) 11/21/2019   Galactosemia ([place]) 11/21/2019   Dermatitis herpetiformis 09/22/2019   ADHD    Iron deficiency anemia 08/25/2015   Insomnia 01/09/2015   Sleep apnea syndrome    RLS (restless legs syndrome)    GERD 08/19/2009   GASTRITIS 08/19/2009   CELIAC SPRUE 07/23/2009   DIARRHEA 07/02/2009   INSOMNIA-SLEEP DISORDER-UNSPEC 06/30/2009   ABDOMINAL PAIN OTHER SPECIFIED SITE 06/30/2009   DEPRESSION 06/05/2008   OBSTRUCTIVE SLEEP APNEA 06/05/2008   RESTLESS LEGS SYNDROME 06/05/2008   Past Medical History:  Diagnosis Date   ADHD    Broken ribs    Celiac disease    Collapse of left lung    GERD (gastroesophageal reflux disease)    RLS (restless legs syndrome)    Sleep apnea syndrome    [facility]  [facility],  12-2-- 2004   Past Surgical History:  Procedure Laterality Date   APPENDECTOMY     HERNIA REPAIR     Allergies  Allergen Reactions   Gluten Meal Other (See Comments)   Prior to Admission medications   Medication Sig Start Date End Date Taking? Authorizing Provider  amLODipine (NORVASC) 10 MG tablet Take 1 tablet (10 mg total) by mouth daily. 05/20/21  Yes [name], MD  dapsone 25 MG tablet Take 2 tablets (50 mg total) by mouth 2 (two) times daily. 04/28/21  Yes Lavonna Monarch, MD  ferrous sulfate 325 (65 FE) MG tablet Take 325 mg by mouth daily. 2 times weekly   Yes [provider]  fish oil-omega-3 fatty acids 1000 MG capsule Take 2 g by mouth 2 (two) times a week.    Yes [provider]  lisdexamfetamine (VYVANSE) 20 MG capsule Take 1 capsule (20 mg total) by mouth daily as needed (adhd). 01/04/22  Yes Millikan, Megan, NP  MITIGARE 0.6 MG CAPS TAKE 2 CAPSULES BY MOUTH AT ONSET OF GOUT FLARE, 1 ADDITIONAL CAPSULE IN 1 HOUR IF NEEDED Patient taking differently: Take 2 capsules by mouth See admin instructions. At onset of gout flares 07/16/20  Yes [name], MD  sertraline (ZOLOFT) 100 MG tablet Take 1 tablet (100 mg total) by mouth at bedtime. 05/20/21  Yes [name], MD  traZODone (DESYREL) 50 MG tablet Take 1  tablet (50 mg total) by mouth at bedtime. 08/19/21  Yes Ward Givens, NP  acetaminophen (TYLENOL) 500 MG tablet Take 2 tablets (1,000 mg total) by mouth every 6 (six) hours as needed for mild pain. 01/27/21   Meuth, Brooke A, PA-C  ibuprofen (ADVIL) 600 MG tablet Take 1 tablet (600 mg total) by mouth every 6 (six) hours as needed for mild pain or moderate pain. 01/27/21   Meuth, Blaine Hamper, PA-C  indomethacin (INDOCIN) 25 MG capsule Take 1 capsule (25 mg total) by mouth 3 (three) times daily as needed (for gout flare with food.). 11/20/20   [name], MD   Social History   Socioeconomic History   Marital status: Divorced     Spouse name: [place]   Number of children: 3   Years of education: Not on file   Highest education level: Not on file  Occupational History   Occupation: Freight forwarder  Tobacco Use   Smoking status: Never   Smokeless tobacco: Never  Vaping Use   Vaping Use: Never used  Substance and Sexual Activity   Alcohol use: Yes    Alcohol/week: 7.0 standard drinks    Types: 7 Standard drinks or equivalent per week    Comment: Occassionally   Drug use: No   Sexual activity: Yes  Other Topics Concern   Not on file  Social History Narrative   Patient lives at home with wife [name].    Patient has 3 children.    Patient has a BA.    Social Determinants of Health   Financial Resource Strain: Not on file  Food Insecurity: Not on file  Transportation Needs: Not on file  Physical Activity: Not on file  Stress: Not on file  Social Connections: Not on file  Intimate Partner Violence: Not on file    Review of Systems Per HPI.   Objective:   Vitals:   01/13/22 1632  BP: 108/70  Pulse: 65  Resp: 17  Temp: 97.8 F (36.6 C)  TempSrc: Temporal  SpO2: 95%  Weight: 210 lb (95.3 kg)  Height: 6' (1.829 m)     Physical Exam Vitals reviewed.  Constitutional:      Appearance: He is well-developed.  HENT:     Head: Normocephalic and atraumatic.  Neck:     Vascular: No carotid bruit or JVD.  Cardiovascular:     Rate and Rhythm: Normal rate and regular rhythm.     Heart sounds: Normal heart sounds. No murmur heard. Pulmonary:     Effort: Pulmonary effort is normal. No respiratory distress.     Breath sounds: Normal breath sounds. No stridor. No wheezing, rhonchi or rales.  Musculoskeletal:     Right lower leg: No edema.     Left lower leg: No edema.  Skin:    General: Skin is warm and dry.  Neurological:     Mental Status: He is alert and oriented to person, place, and time.  Psychiatric:        Mood and Affect: Mood normal.     EKG: Sinus rhythm, rate 60.  No apparent acute ST/T  wave changes, improved compared to 09/20/2018 comparison EKG.  Heart rate 46 at that time.  Assessment & Plan:  MEHKI KLUMPP is a 61 y.o. male . Subacute cough - Plan: azithromycin (ZITHROMAX) 250 MG tablet, DG Chest 2 View, Pro b natriuretic peptide  Lightheadedness - Plan: EKG 12-Lead, DG Chest 2 View  Fatigue, unspecified type - Plan: Basic metabolic panel, CBC,  azithromycin (ZITHROMAX) 250 MG tablet, DG Chest 2 View  Shortness of breath - Plan: Basic metabolic panel, CBC, DG Chest 2 View, Pro b natriuretic peptide  Approximately 10-day history of cough, initial viral syndrome likely with persistent cough.  Fatigue, episodic lightheadedness without syncope.  Dyspnea with exertion but not at rest.  Lungs were clear on exam, speaking in full sentences without respiratory distress.  Differential includes pneumonia with intermittent fevers.  EKG without concerning findings.  Reassuring vital signs.  Check CBC, BMP, chest x-ray tomorrow morning.  Start azithromycin.  Fluids, rest, ER precautions given.  Recheck 1 week.  If improving at that time can review his chronic medications.   Meds ordered this encounter  Medications   azithromycin (ZITHROMAX) 250 MG tablet    Sig: Take 2 tablets on day 1, then 1 tablet daily on days 2 through 5    Dispense:  6 tablet    Refill:  0   Patient Instructions  Increase fluids, rest, avoid exercise or exertion for now (like yardwork).  Mucinex if needed for cough. Start zpak antibiotic today, but have bloodwork and xray performed tomorrow morning if possible.  Make sure you advise the lab of previous lightheadedness with lab draws so that you can be watched temporarily after your lab.  [facility] Walk in 8:30-4:30 during weekdays, no appointment needed [address]  [place], [place] [address]   Recheck 1 week for cough and fatigue.   Return to the clinic or go to the nearest emergency room if any of your symptoms worsen or new symptoms  occur.  We should be able to discuss your chronic medications as well at next visit based on the lab work I ordered for tomorrow     Signed,   Merri Ray, MD [place] 01/13/22 5:25 PM

## 2022-01-14 ENCOUNTER — Other Ambulatory Visit (INDEPENDENT_AMBULATORY_CARE_PROVIDER_SITE_OTHER): Payer: 59

## 2022-01-14 ENCOUNTER — Ambulatory Visit (INDEPENDENT_AMBULATORY_CARE_PROVIDER_SITE_OTHER)
Admission: RE | Admit: 2022-01-14 | Discharge: 2022-01-14 | Disposition: A | Discharge status: Home / Self Care | Payer: 59 | Source: Ambulatory Visit | Attending: Family Medicine | Admitting: Family Medicine

## 2022-01-14 ENCOUNTER — Other Ambulatory Visit: Payer: Self-pay

## 2022-01-14 DIAGNOSIS — R5383 Other fatigue: Secondary | ICD-10-CM | POA: Diagnosis not present

## 2022-01-14 DIAGNOSIS — R42 Dizziness and giddiness: Secondary | ICD-10-CM | POA: Diagnosis not present

## 2022-01-14 DIAGNOSIS — R0602 Shortness of breath: Secondary | ICD-10-CM

## 2022-01-14 DIAGNOSIS — R052 Subacute cough: Secondary | ICD-10-CM | POA: Diagnosis not present

## 2022-01-14 LAB — CBC
HCT: 37.2 % — ABNORMAL LOW (ref 39.0–52.0)
Hemoglobin: 12.6 g/dL — ABNORMAL LOW (ref 13.0–17.0)
MCHC: 33.9 g/dL (ref 30.0–36.0)
MCV: 97.9 fl (ref 78.0–100.0)
Platelets: 316 10*3/uL (ref 150.0–400.0)
RBC: 3.8 Mil/uL — ABNORMAL LOW (ref 4.22–5.81)
RDW: 13.4 % (ref 11.5–15.5)
WBC: 7.3 10*3/uL (ref 4.0–10.5)

## 2022-01-14 LAB — BASIC METABOLIC PANEL
BUN: 16 mg/dL (ref 6–23)
CO2: 27 mEq/L (ref 19–32)
Calcium: 9.2 mg/dL (ref 8.4–10.5)
Chloride: 105 mEq/L (ref 96–112)
Creatinine, Ser: 0.98 mg/dL (ref 0.40–1.50)
GFR: 82.44 mL/min (ref >60.00)
Glucose, Bld: 77 mg/dL (ref 70–99)
Potassium: 4.3 mEq/L (ref 3.5–5.1)
Sodium: 139 mEq/L (ref 135–145)

## 2022-01-15 ENCOUNTER — Encounter: Payer: Self-pay | Admitting: Family Medicine

## 2022-01-15 LAB — PRO B NATRIURETIC PEPTIDE: NT-Pro BNP: 290 pg/mL — ABNORMAL HIGH (ref 0–210)

## 2022-01-15 NOTE — Telephone Encounter (Signed)
Pt had recent XR and labs but is asking if he is contagious ?

## 2022-01-15 NOTE — Telephone Encounter (Signed)
MyChart message sent regarding results. ?

## 2022-02-04 ENCOUNTER — Telehealth: Payer: Self-pay | Admitting: Adult Health

## 2022-02-04 NOTE — Telephone Encounter (Signed)
LVM and sent mychart msg informing pt of appt change- Megan out 4/27. ?

## 2022-02-15 ENCOUNTER — Other Ambulatory Visit: Payer: Self-pay | Admitting: Adult Health

## 2022-02-15 MED ORDER — LISDEXAMFETAMINE DIMESYLATE 20 MG PO CAPS: 20.0000 mg | ORAL_CAPSULE | Freq: Every day | ORAL | 0 refills | Status: DC | PRN

## 2022-02-15 NOTE — Telephone Encounter (Signed)
Pt is requesting a refill for lisdexamfetamine (VYVANSE) 20 MG capsule . ? ?Pharmacy:  [facility] #[id number]  ? ?

## 2022-02-15 NOTE — Telephone Encounter (Signed)
Last visit: 08/19/2021 ?Next visit: 07/14/2022 ?Per [place] registry, last filled on 01/04/2022 #30/30. ? ?Rx request sent to Dr Dohmeier d/t Jinny Blossom NP being out of office.  ?

## 2022-03-04 ENCOUNTER — Ambulatory Visit: Payer: 59 | Admitting: Adult Health

## 2022-03-09 ENCOUNTER — Ambulatory Visit: Payer: 59 | Admitting: Adult Health

## 2022-03-23 ENCOUNTER — Other Ambulatory Visit: Payer: Self-pay | Admitting: Adult Health

## 2022-03-23 NOTE — Telephone Encounter (Signed)
Pt called needing a refill request for his lisdexamfetamine (VYVANSE) 20 MG capsule sent to the [facility] on [address] ?

## 2022-03-23 NOTE — Telephone Encounter (Signed)
Last visit: 08/19/21 ?Next visit: 07/14/22 ?Per  registry, last filled #30/30 on 02/15/2022. Refill sent sent to MM NP.  ?

## 2022-03-24 MED ORDER — LISDEXAMFETAMINE DIMESYLATE 20 MG PO CAPS
20.0000 mg | ORAL_CAPSULE | Freq: Every day | ORAL | 0 refills | Status: DC | PRN
Start: 2022-03-24 — End: 2022-05-04

## 2022-04-09 ENCOUNTER — Other Ambulatory Visit: Payer: Self-pay | Admitting: Family Medicine

## 2022-04-09 DIAGNOSIS — I10 Essential (primary) hypertension: Secondary | ICD-10-CM

## 2022-05-04 ENCOUNTER — Other Ambulatory Visit: Payer: Self-pay | Admitting: Adult Health

## 2022-05-05 MED ORDER — LISDEXAMFETAMINE DIMESYLATE 20 MG PO CAPS: 20.0000 mg | ORAL_CAPSULE | Freq: Every day | ORAL | 0 refills | Status: DC | PRN

## 2022-06-02 ENCOUNTER — Other Ambulatory Visit: Payer: Self-pay | Admitting: Adult Health

## 2022-06-02 NOTE — Telephone Encounter (Signed)
Pt is calling and requesting a refill on lisdexamfetamine (VYVANSE) 20 MG capsule. Pt is requesting prescription be sent to [facility], [place]

## 2022-06-03 MED ORDER — LISDEXAMFETAMINE DIMESYLATE 20 MG PO CAPS: 20.0000 mg | ORAL_CAPSULE | Freq: Every day | ORAL | 0 refills | Status: DC | PRN

## 2022-06-08 ENCOUNTER — Encounter: Payer: Self-pay | Admitting: Dermatology

## 2022-06-14 ENCOUNTER — Telehealth: Payer: Self-pay | Admitting: *Deleted

## 2022-06-14 NOTE — Telephone Encounter (Signed)
Called patient- informed him he was due office visit back in June so no refills can be given at this time. Patient will call  skin to get new appointment to get on going medications refilled.

## 2022-06-30 ENCOUNTER — Ambulatory Visit: Payer: 59 | Admitting: Dermatology

## 2022-07-07 ENCOUNTER — Other Ambulatory Visit: Payer: Self-pay | Admitting: Adult Health

## 2022-07-07 MED ORDER — LISDEXAMFETAMINE DIMESYLATE 20 MG PO CAPS: 20.0000 mg | ORAL_CAPSULE | Freq: Every day | ORAL | 0 refills | Status: DC | PRN

## 2022-07-07 NOTE — Telephone Encounter (Signed)
Last seen 08/19/21 Next visit 07/14/22  Per  registry, last filled on 06/14/2022 #30/30. Rx refill sent to MM NP.

## 2022-07-07 NOTE — Telephone Encounter (Signed)
Medication refill request for Vyvanse please it;s a monthly refill. Thank you

## 2022-07-07 NOTE — Addendum Note (Signed)
Addended by: Gildardo Griffes on: 07/07/2022 11:52 AM   Modules accepted: Orders

## 2022-07-14 ENCOUNTER — Encounter: Payer: Self-pay | Admitting: Adult Health

## 2022-07-14 ENCOUNTER — Ambulatory Visit: Payer: Commercial Managed Care - HMO | Admitting: Adult Health

## 2022-07-14 VITALS — BP 119/77 | HR 62 | Ht 72.0 in | Wt 212.0 lb

## 2022-07-14 DIAGNOSIS — G4700 Insomnia, unspecified: Secondary | ICD-10-CM | POA: Diagnosis not present

## 2022-07-14 DIAGNOSIS — F909 Attention-deficit hyperactivity disorder, unspecified type: Secondary | ICD-10-CM

## 2022-07-14 DIAGNOSIS — G2581 Restless legs syndrome: Secondary | ICD-10-CM

## 2022-07-14 MED ORDER — TRAZODONE HCL 50 MG PO TABS: 50.0000 mg | ORAL_TABLET | Freq: Every day | ORAL | 3 refills | Status: AC

## 2022-07-14 NOTE — Progress Notes (Signed)
PATIENT: Aaron [name] DOB: Sep 06, 1961  REASON FOR VISIT: follow up HISTORY FROM: patient PRIMARY NEUROLOGIST: [name]  Chief Complaint  Patient presents with   Rm 4    Here alone for follow-up. States everything is fine, no issues. Here for refills.    HISTORY OF PRESENT ILLNESS: Today 07/14/22:  Aaron [name] is a 61 year old male with a history of insomnia, restless legs and ADHD.  He returns today for follow-up.  He remains on trazodone at bedtime.  He reports that this works well for him.  Also continues on Vyvanse for ADHD.  The patient is currently happy with his treatment plan.  Does not wish to change or add any additional medication.  He returns today for an evaluation.  08/19/21: Aaron [name] is a 61 year old male with a history of insomnia, restless legs, and ADHD.  He returns today for follow-up.  He reports that he has been doing well with trazodone at bedtime.  He states that he does not want to be on any medication that is addictive.  Reports that he has good nights and bad nights.  This has been an ongoing issue for him.  He reports that his restless legs are not bothersome.  He continues on Vyvanse for ADHD.  Has tried multiple other medications in the past and this gives him the best results.  He returns today for an evaluation.  HISTORY (Copied from Dr.Dohmeier's note) Aaron [name] is a 61 y.o. male, seen here as a revisit for RLS He also has a new concern about circadian rhythym changes and ADHD.  The patient reports that he has always had problems to sleep well and continuously for a long time but he has begun to push his bedtime more more into the morning hours and accordingly his circadian rhythm has shifted.  Since he is also treated for ADHD and restless legs there may be some medication side effects that also influence this pattern his daytime sleepiness was endorsed on the Epworth scale at 12 points he feels subjectively that his restless legs have  gotten better but that he also goes to bed later because he feels he has the highest level of productivity around midnight and later.  He is also a Physicist, medical and so when he reaches a certain workflow he likes to " ride the wave '. He naps in daytime.  He remains physically active. It ADHD got worse in his 25s.  REVIEW OF SYSTEMS: Out of a complete 14 system review of symptoms, the patient complains only of the following symptoms, and all other reviewed systems are negative.  ALLERGIES: Allergies  Allergen Reactions   Gluten Meal Other (See Comments)    HOME MEDICATIONS: Outpatient Medications Prior to Visit  Medication Sig Dispense Refill   amLODipine (NORVASC) 10 MG tablet TAKE 1 TABLET(10 MG) BY MOUTH DAILY 90 tablet 2   dapsone 25 MG tablet Take 2 tablets (50 mg total) by mouth 2 (two) times daily. 180 tablet 1   ferrous sulfate 325 (65 FE) MG tablet Take 325 mg by mouth daily. 2 times weekly     fish oil-omega-3 fatty acids 1000 MG capsule Take 2 g by mouth 2 (two) times a week.      indomethacin (INDOCIN) 25 MG capsule Take 1 capsule (25 mg total) by mouth 3 (three) times daily as needed (for gout flare with food.). 30 capsule 0   lisdexamfetamine (VYVANSE) 20 MG capsule Take 1 capsule (20 mg total) by mouth  daily as needed (adhd). 30 capsule 0   sertraline (ZOLOFT) 100 MG tablet Take 1 tablet (100 mg total) by mouth at bedtime. 90 tablet 2   traZODone (DESYREL) 50 MG tablet Take 1 tablet (50 mg total) by mouth at bedtime. 90 tablet 3   MITIGARE 0.6 MG CAPS TAKE 2 CAPSULES BY MOUTH AT ONSET OF GOUT FLARE, 1 ADDITIONAL CAPSULE IN 1 HOUR IF NEEDED (Patient not taking: Reported on 07/14/2022) 15 capsule 1   acetaminophen (TYLENOL) 500 MG tablet Take 2 tablets (1,000 mg total) by mouth every 6 (six) hours as needed for mild pain. 30 tablet 0   ibuprofen (ADVIL) 600 MG tablet Take 1 tablet (600 mg total) by mouth every 6 (six) hours as needed for mild pain or moderate pain. 30 tablet 0    No facility-administered medications prior to visit.    PAST MEDICAL HISTORY: Past Medical History:  Diagnosis Date   ADHD    Broken ribs    Celiac disease    Collapse of left lung    GERD (gastroesophageal reflux disease)    RLS (restless legs syndrome)    Sleep apnea syndrome    [facility],  12-2-- 2004    PAST SURGICAL HISTORY: Past Surgical History:  Procedure Laterality Date   APPENDECTOMY     HERNIA REPAIR      FAMILY HISTORY: Family History  Problem Relation Age of Onset   Hypertension Mother    Diabetes Mother    Hypertension Father    Heart disease Maternal Grandmother    Heart disease Maternal Grandfather    Heart disease Paternal Grandfather    Colon cancer Neg Hx    Colon polyps Neg Hx    Kidney disease Neg Hx    Esophageal cancer Neg Hx     SOCIAL HISTORY: Social History   Socioeconomic History   Marital status: Divorced    Spouse name: [place]   Number of children: 3   Years of education: Not on file   Highest education level: Not on file  Occupational History   Occupation: Freight forwarder  Tobacco Use   Smoking status: Never   Smokeless tobacco: Never  Vaping Use   Vaping Use: Never used  Substance and Sexual Activity   Alcohol use: Yes    Alcohol/week: 7.0 standard drinks of alcohol    Types: 7 Standard drinks or equivalent per week   Drug use: No   Sexual activity: Yes  Other Topics Concern   Not on file  Social History Narrative   Patient lives at home alone. Patient has 3 children. Patient has a BA.    Social Determinants of Health   Financial Resource Strain: Not on file  Food Insecurity: Not on file  Transportation Needs: Not on file  Physical Activity: Not on file  Stress: Not on file  Social Connections: Not on file  Intimate Partner Violence: Not on file      PHYSICAL EXAM  Vitals:   07/14/22 1455  BP: 119/77  Pulse: 62  Weight: 212 lb (96.2 kg)  Height: 6' (1.829 m)   Body mass index is  28.75 kg/m.  Generalized: Well developed, in no acute distress   Neurological examination  Mentation: Alert oriented to time, place, history taking. Follows all commands speech and language fluent Cranial nerve II-XII: Pupils were equal round reactive to light. Extraocular movements were full, visual field were full on confrontational test. Facial sensation and strength were normal.  Motor: The motor testing reveals 5 over 5 strength of all 4 extremities. Good symmetric motor tone is noted throughout.  Sensory: Sensory testing is intact to soft touch on all 4 extremities. No evidence of extinction is noted.  Coordination: Cerebellar testing reveals good finger-nose-finger and heel-to-shin bilaterally.  Gait and station: Gait is normal.    DIAGNOSTIC DATA (LABS, IMAGING, TESTING) - I reviewed patient records, labs, notes, testing and imaging myself where available.  Lab Results  Component Value Date   WBC 7.3 01/14/2022   HGB 12.6 (L) 01/14/2022   HCT 37.2 (L) 01/14/2022   MCV 97.9 01/14/2022   PLT 316.0 01/14/2022      Component Value Date/Time   NA 139 01/14/2022 1542   NA 140 11/20/2020 1806   K 4.3 01/14/2022 1542   CL 105 01/14/2022 1542   CO2 27 01/14/2022 1542   GLUCOSE 77 01/14/2022 1542   BUN 16 01/14/2022 1542   BUN 11 11/20/2020 1806   CREATININE 0.98 01/14/2022 1542   CREATININE 1.16 07/20/2013 1004   CALCIUM 9.2 01/14/2022 1542   PROT 7.5 05/21/2021 1641   PROT 7.2 07/03/2020 1815   ALBUMIN 4.3 05/21/2021 1641   ALBUMIN 4.6 07/03/2020 1815   AST 20 05/21/2021 1641   ALT 16 05/21/2021 1641   ALKPHOS 122 (H) 05/21/2021 1641   BILITOT 0.5 05/21/2021 1641   BILITOT 0.5 07/03/2020 1815   GFRNONAA >60 01/27/2021 0336   GFRAA 87 11/20/2020 1806   Lab Results  Component Value Date   CHOL 131 05/21/2021   HDL 47.90 05/21/2021   LDLCALC 67 05/21/2021   TRIG 81.0 05/21/2021   CHOLHDL 3 05/21/2021   Lab Results  Component Value Date   HGBA1C 5.0 05/21/2021    No results found for: "VITAMINB12" Lab Results  Component Value Date   TSH 1.840 11/20/2020      ASSESSMENT AND PLAN 61 y.o. year old male  has a past medical history of ADHD, Broken ribs, Celiac disease, Collapse of left lung, GERD (gastroesophageal reflux disease), RLS (restless legs syndrome), and Sleep apnea syndrome. here with :  1.  ADHD 2.  Restless leg syndrome 3.  Insomnia  --Continue trazodone 50 mg at bedtime --Continue Vyvanse 20 mg daily as needed --Follow-up in 6 months or sooner if needed     Ward Givens, MSN, NP-C 07/14/2022, 3:14 PM [facility] [address], [place], [place] [address] [phone] '

## 2022-07-14 NOTE — Patient Instructions (Signed)
Continue trazodone 50 mg at bedtime Continue Vyvanse 20 mg daily as needed

## 2022-07-28 ENCOUNTER — Telehealth: Payer: Self-pay | Admitting: *Deleted

## 2022-07-28 NOTE — Telephone Encounter (Signed)
PA Lisdexamfetamine Dimesylate completed over the phone with [facility] Reference# [id number] .    Waiting on Approval

## 2022-08-03 ENCOUNTER — Other Ambulatory Visit: Payer: Self-pay | Admitting: Family Medicine

## 2022-08-03 DIAGNOSIS — F329 Major depressive disorder, single episode, unspecified: Secondary | ICD-10-CM

## 2022-08-05 ENCOUNTER — Other Ambulatory Visit: Payer: Self-pay | Admitting: Family Medicine

## 2022-08-05 DIAGNOSIS — F329 Major depressive disorder, single episode, unspecified: Secondary | ICD-10-CM

## 2022-08-13 ENCOUNTER — Encounter: Payer: Self-pay | Admitting: Adult Health

## 2022-08-17 NOTE — Telephone Encounter (Signed)
Lisdexamfetamine Dimesylate  Approved Approval Dates  08/17/22-08/17/2023 Case # [id number]  Notified patient through [facility]

## 2022-09-08 MED ORDER — VYVANSE 20 MG PO CAPS: 20.0000 mg | ORAL_CAPSULE | Freq: Every day | ORAL | 0 refills | Status: AC | PRN

## 2022-09-08 NOTE — Telephone Encounter (Signed)
Spoke to [place] me Lisdexamfetamine was denied but Vyvanse is approved for a 1 year .  Approval # [id number] Will forward to Megan,NP to place new order for Vyvanse . Spoke to patient he is aware of [employer] decision  and agrees to Vyvanse.

## 2022-09-08 NOTE — Addendum Note (Signed)
Addended by: Trudie Buckler on: 09/08/2022 04:46 PM   Modules accepted: Orders

## 2022-10-22 ENCOUNTER — Encounter: Payer: Self-pay | Admitting: Adult Health

## 2022-10-25 MED ORDER — LISDEXAMFETAMINE DIMESYLATE 20 MG PO CAPS: 20.0000 mg | ORAL_CAPSULE | Freq: Every day | ORAL | 0 refills | Status: AC | PRN

## 2022-10-25 NOTE — Telephone Encounter (Signed)
Last visit 07/14/22 Next visit not scheduled yet  Per [place] registry, last filled brand Vyanse 20 mg on 06/14/2022 #30/30. Rx request sent to MM NP.

## 2023-04-28 ENCOUNTER — Other Ambulatory Visit: Payer: Self-pay | Admitting: Family Medicine

## 2023-04-28 DIAGNOSIS — I10 Essential (primary) hypertension: Secondary | ICD-10-CM

## 2023-04-29 NOTE — Telephone Encounter (Signed)
Called left vm ..

## 2023-04-29 NOTE — Telephone Encounter (Signed)
Called to schedule an appointment no answer, LM to call back once scheduled can send refill

## 2023-05-02 NOTE — Telephone Encounter (Signed)
Left vm to call and make appt.

## 2023-05-03 ENCOUNTER — Telehealth: Payer: Self-pay

## 2023-05-03 ENCOUNTER — Other Ambulatory Visit: Payer: Self-pay

## 2023-05-03 DIAGNOSIS — I10 Essential (primary) hypertension: Secondary | ICD-10-CM

## 2023-05-03 MED ORDER — AMLODIPINE BESYLATE 10 MG PO TABS: ORAL_TABLET | ORAL | 0 refills | Status: AC

## 2023-05-03 NOTE — Telephone Encounter (Signed)
I sent in a 30 day supply with no refills of the requested medication and sent pt a my chart message stating he needs an apt in order to keep getting refills .

## 2023-05-03 NOTE — Telephone Encounter (Signed)
Sent pt a mychart message. 

## 2023-05-03 NOTE — Telephone Encounter (Signed)
Called patient to make an appointment, not seen since 01/2022  No answer, LM to call back and schedule

## 2023-07-15 ENCOUNTER — Other Ambulatory Visit: Payer: Self-pay | Admitting: Family Medicine

## 2023-07-15 ENCOUNTER — Other Ambulatory Visit: Payer: Self-pay

## 2023-07-15 DIAGNOSIS — F329 Major depressive disorder, single episode, unspecified: Secondary | ICD-10-CM

## 2023-07-15 MED ORDER — SERTRALINE HCL 100 MG PO TABS: 100.0000 mg | ORAL_TABLET | Freq: Every day | ORAL | 0 refills | Status: AC

## 2023-09-03 ENCOUNTER — Other Ambulatory Visit: Payer: Self-pay | Admitting: Adult Health

## 2023-09-14 ENCOUNTER — Encounter: Payer: Self-pay | Admitting: Gastroenterology

## 2023-12-14 ENCOUNTER — Encounter: Payer: Self-pay | Admitting: Allergy

## 2023-12-14 ENCOUNTER — Other Ambulatory Visit: Payer: Self-pay

## 2023-12-14 ENCOUNTER — Ambulatory Visit (INDEPENDENT_AMBULATORY_CARE_PROVIDER_SITE_OTHER): Payer: No Typology Code available for payment source | Admitting: Allergy

## 2023-12-14 VITALS — BP 126/78 | HR 61 | Temp 97.6°F | Resp 18 | Ht 70.87 in | Wt 213.2 lb

## 2023-12-14 DIAGNOSIS — J3089 Other allergic rhinitis: Secondary | ICD-10-CM

## 2023-12-14 DIAGNOSIS — K900 Celiac disease: Secondary | ICD-10-CM

## 2023-12-14 DIAGNOSIS — H1013 Acute atopic conjunctivitis, bilateral: Secondary | ICD-10-CM | POA: Diagnosis not present

## 2023-12-14 MED ORDER — FLUTICASONE PROPIONATE 50 MCG/ACT NA SUSP: 1.0000 | Freq: Every day | NASAL | 5 refills | Status: AC | PRN

## 2023-12-14 NOTE — Progress Notes (Signed)
 New Patient Note  RE: Aaron [name] MRN: [id number] DOB: May 21, 1961 Date of Office Visit: 12/14/2023  Consult requested by: Levora Reyes SAUNDERS, MD Primary care provider: Lazoff, Shawn P, DO  Chief Complaint: Other (Derm is treating him for celiac ) and Allergic Rhinitis  (Last year oak tree and fall raking oak leaves bother him - heavy congestion, yellow mucus )  History of Present Illness: I had the pleasure of seeing Aaron [name] for initial evaluation at the [facility] on 12/14/2023. He is a 63 y.o. male, who is self-referred here for the evaluation of allergic rhinitis.  Discussed the use of AI scribe software for clinical note transcription with the patient, who gave verbal consent to proceed.  Aaron [name] is a 63 year old male who presents with concerns about seasonal allergies.  He experiences significant seasonal allergy symptoms, particularly during the spring and fall, which he attributes to oak tree pollen. Symptoms include heavy congestion, yellow drainage, and occasionally itchy, watery eyes. These symptoms worsen during yard work, especially around [employer] trees. No sinus infections requiring antibiotics, headaches, or asthma. He is a mouth breather and suspects a deviated septum. No history of nasal surgeries or nasal polyps.  He has tried various over-the-counter medications but found them ineffective. He has not used nasal sprays recently, although he did in the past without much success. He has not been on allergy shots and has not seen an ENT.  [place] and enjoys yard work, which he believes contributes to his symptoms.  He has celiac disease and is on dapsone  for skin rashes associated with it. He follows a gluten-free diet and notes that his skin condition is under control.      He reports symptoms of nasal congestion, drainage, slight sneezing, itchy/watery eyes - mostly cold like symptoms. Symptoms have been going on  for some years but worse the past year. The symptoms are present mainly in the spring and fall. Other triggers include exposure to oak trees. Anosmia: no. Headache: no. He has used OTC medications with no improvement in symptoms. Sinus infections: no. Previous work up includes: skin testing at age 61 was negative. No prior AIT. Previous ENT evaluation: no, no prior sinus surgery. History of nasal polyps: no. Last eye exam: not recently. History of reflux: in the past.  Assessment and Plan: Aaron [name] is a 63 y.o. male with: Other allergic rhinitis Allergic conjunctivitis of both eyes Reports of heavy congestion, some sneezing, and possibly itchy, watery eyes during spring and fall, particularly when around oak trees. Symptoms seem to have worsened over the past year. Previous allergy test 30 years ago was negative. Return for allergy skin testing. Will make additional recommendations based on results. If negative will refer to ENT next.  Patient plans to schedule once he has his new insurance.  Use Flonase  (fluticasone ), Nasacort  or Nasonex nasal spray 1-2 sprays per nostril once a day as needed for nasal congestion.  Nasal saline spray (i.e., Simply Saline) or nasal saline lavage (i.e., NeilMed) is recommended as needed and prior to medicated nasal sprays. Use over the counter antihistamines such as Zyrtec (cetirizine), Claritin (loratadine), Allegra (fexofenadine), or Xyzal (levocetirizine) daily as needed. May switch antihistamines every few months.  Celiac disease Patient has a known diagnosis of celiac disease and is on dapsone  for associated skin rashes. Reports that condition is generally under control with adherence to a gluten-free diet. Continue current management.  Return for  Skin testing.  Meds ordered this encounter  Medications   fluticasone  (FLONASE ) 50 MCG/ACT nasal spray    Sig: Place 1-2 sprays into both nostrils daily as needed (nasal congestion).    Dispense:  16 g     Refill:  5   Lab Orders  No laboratory test(s) ordered today    Other allergy screening: Asthma: no Food allergy:  has celiac's disease - avoiding gluten Used to follow with dermatology and currently on dapsone .   Medication allergy: no Hymenoptera allergy: no Urticaria: no Eczema:no History of recurrent infections suggestive of immunodeficency: no  Diagnostics: None.   Past Medical History: Patient Active Problem List   Diagnosis Date Noted   Broken ribs 08/19/2021   Collapse of left lung 08/19/2021   Pneumothorax, left 01/24/2021   ADHD, hyperactive-impulsive type 04/30/2020   Circadian rhythm sleep disorder, delayed sleep phase type 11/21/2019   Adult residual type attention deficit hyperactivity disorder (ADHD) 11/21/2019   Galactosemia (HCC) 11/21/2019   Dermatitis herpetiformis 09/22/2019   ADHD    Iron deficiency anemia 08/25/2015   Insomnia 01/09/2015   Sleep apnea syndrome    RLS (restless legs syndrome)    GERD 08/19/2009   Gastritis and gastroduodenitis 08/19/2009   CELIAC SPRUE 07/23/2009   DIARRHEA 07/02/2009   INSOMNIA-SLEEP DISORDER-UNSPEC 06/30/2009   ABDOMINAL PAIN OTHER SPECIFIED SITE 06/30/2009   DEPRESSION 06/05/2008   OBSTRUCTIVE SLEEP APNEA 06/05/2008   RESTLESS LEGS SYNDROME 06/05/2008   Past Medical History:  Diagnosis Date   ADHD    Broken ribs    Celiac disease    Collapse of left lung    GERD (gastroesophageal reflux disease)    RLS (restless legs syndrome)    Sleep apnea syndrome    [name],  12-2-- 2004   Past Surgical History: Past Surgical History:  Procedure Laterality Date   APPENDECTOMY     HERNIA REPAIR     Medication List:  Current Outpatient Medications  Medication Sig Dispense Refill   amLODipine  (NORVASC ) 10 MG tablet TAKE 1 TABLET(10 MG) BY MOUTH DAILY 30 tablet 0   dapsone  25 MG tablet Take 2 tablets (50 mg total) by mouth 2 (two) times daily. 180 tablet 1   ferrous sulfate 325  (65 FE) MG tablet Take 325 mg by mouth daily. 2 times weekly     fish oil-omega-3 fatty acids 1000 MG capsule Take 2 g by mouth 2 (two) times a week.      fluticasone  (FLONASE ) 50 MCG/ACT nasal spray Place 1-2 sprays into both nostrils daily as needed (nasal congestion). 16 g 5   gabapentin  (NEURONTIN ) 300 MG capsule Take 300 mg by mouth daily.     indomethacin  (INDOCIN ) 25 MG capsule Take 1 capsule (25 mg total) by mouth 3 (three) times daily as needed (for gout flare with food.). 30 capsule 0   lisdexamfetamine  (VYVANSE ) 20 MG capsule Take 1 capsule (20 mg total) by mouth daily as needed (adhd). 30 capsule 0   MITIGARE  0.6 MG CAPS TAKE 2 CAPSULES BY MOUTH AT ONSET OF GOUT FLARE, 1 ADDITIONAL CAPSULE IN 1 HOUR IF NEEDED 15 capsule 1   sertraline  (ZOLOFT ) 100 MG tablet Take 1 tablet (100 mg total) by mouth daily. 30 tablet 0   traZODone  (DESYREL ) 50 MG tablet Take 1 tablet (50 mg total) by mouth at bedtime. 90 tablet 3   VYVANSE  20 MG capsule Take 1 capsule (20 mg total) by mouth daily as needed (adhd). 30  capsule 0   No current facility-administered medications for this visit.   Allergies: Allergies  Allergen Reactions   Gluten Meal Other (See Comments)   Social History: Social History   Socioeconomic History   Marital status: Divorced    Spouse name: Mary   Number of children: 3   Years of education: Not on file   Highest education level: Not on file  Occupational History   Occupation: Production Designer, Theatre/television/film  Tobacco Use   Smoking status: Never    Passive exposure: Never   Smokeless tobacco: Never  Vaping Use   Vaping status: Never Used  Substance and Sexual Activity   Alcohol use: Yes    Alcohol/week: 7.0 standard drinks of alcohol    Types: 7 Standard drinks or equivalent per week   Drug use: No   Sexual activity: Yes  Other Topics Concern   Not on file  Social History Narrative   Patient lives at home alone. Patient has 3 children. Patient has a BA.    Social Drivers of Manufacturing Engineer Strain: Not on file  Food Insecurity: Low Risk  (09/27/2023)   Received from [facility]   Hunger Vital Sign    Worried About Running Out of Food in the Last Year: Never true    Ran Out of Food in the Last Year: Never true  Transportation Needs: No Transportation Needs (09/27/2023)   Received from [facility]    In the past 12 months, has lack of reliable transportation kept you from medical appointments, meetings, work or from getting things needed for daily living? : No  Physical Activity: Not on file  Stress: Not on file  Social Connections: Not on file   Lives in a house. Smoking: denies Occupation: retired  Landscape Architect HistorySurveyor, Minerals in the house: no Engineer, Civil (consulting) in the family room: no Carpet in the bedroom: yes Heating: gas Cooling: central Pet: no  Family History: Family History  Problem Relation Age of Onset   Allergic rhinitis Mother    Hypertension Mother    Diabetes Mother    Hypertension Father    Allergic rhinitis Sister    Allergic rhinitis Brother    Heart disease Maternal Grandmother    Heart disease Maternal Grandfather    Heart disease Paternal Grandfather    Colon cancer Neg Hx    Colon polyps Neg Hx    Kidney disease Neg Hx    Esophageal cancer Neg Hx    Review of Systems  Constitutional:  Negative for appetite change, chills, fever and unexpected weight change.  HENT:  Positive for congestion, postnasal drip and rhinorrhea.   Eyes:  Negative for itching.  Respiratory:  Negative for cough, chest tightness, shortness of breath and wheezing.   Cardiovascular:  Negative for chest pain.  Gastrointestinal:  Negative for abdominal pain.  Genitourinary:  Negative for difficulty urinating.  Skin:  Positive for rash.  Neurological:  Negative for headaches.    Objective: BP 126/78 (BP Location: Right Arm, Patient Position: Sitting, Cuff Size: Normal)   Pulse 61   Temp 97.6 F (36.4 C)   Resp 18    Ht 5' 10.87 (1.8 m)   Wt 213 lb 3.2 oz (96.7 kg)   SpO2 96%   BMI 29.85 kg/m  Body mass index is 29.85 kg/m. Physical Exam Vitals and nursing note reviewed.  Constitutional:      Appearance: Normal appearance. He is well-developed.  HENT:     Head: Normocephalic and  atraumatic.     Right Ear: Tympanic membrane and external ear normal.     Left Ear: Tympanic membrane and external ear normal.     Nose: Nose normal.     Mouth/Throat:     Mouth: Mucous membranes are moist.     Pharynx: Oropharynx is clear.  Eyes:     Conjunctiva/sclera: Conjunctivae normal.  Cardiovascular:     Rate and Rhythm: Normal rate and regular rhythm.     Heart sounds: Normal heart sounds. No murmur heard.    No friction rub. No gallop.  Pulmonary:     Effort: Pulmonary effort is normal.     Breath sounds: Normal breath sounds. No wheezing, rhonchi or rales.  Musculoskeletal:     Cervical back: Neck supple.  Skin:    General: Skin is warm.     Findings: No rash.  Neurological:     Mental Status: He is alert and oriented to person, place, and time.  Psychiatric:        Behavior: Behavior normal.   The plan was reviewed with the patient/family, and all questions/concerned were addressed.  It was my pleasure to see Ashden today and participate in his care. Please feel free to contact me with any questions or concerns.  Sincerely,  Orlan Cramp, DO Allergy & Immunology  [facility]  [facility] office: [phone] [facility] office: [phone]

## 2023-12-14 NOTE — Patient Instructions (Addendum)
 Rhinitis  Return for allergy skin testing. Will make additional recommendations based on results. Make sure you don't take any antihistamines for 3 days before the skin testing appointment. Don't put any lotion on the back and arms on the day of testing.  Plan on being here for 30-60 minutes.   Use Flonase  (fluticasone ), Nasacort  or Nasonex nasal spray 1-2 sprays per nostril once a day as needed for nasal congestion.  Nasal saline spray (i.e., Simply Saline) or nasal saline lavage (i.e., NeilMed) is recommended as needed and prior to medicated nasal sprays. Use over the counter antihistamines such as Zyrtec (cetirizine), Claritin (loratadine), Allegra (fexofenadine), or Xyzal (levocetirizine) daily as needed. May switch antihistamines every few months.  Follow up for skin testing.   Reducing Pollen Exposure Pollen seasons: trees (spring), grass (summer) and ragweed/weeds (fall). Keep windows closed in your home and car to lower pollen exposure.  Install air conditioning in the bedroom and throughout the house if possible.  Avoid going out in dry windy days - especially early morning. Pollen counts are highest between 5 - 10 AM and on dry, hot and windy days.  Save outside activities for late afternoon or after a heavy rain, when pollen levels are lower.  Avoid mowing of grass if you have grass pollen allergy. Be aware that pollen can also be transported indoors on people and pets.  Dry your clothes in an automatic dryer rather than hanging them outside where they might collect pollen.  Rinse hair and eyes before bedtime.

## 2024-03-31 LAB — COLOGUARD

## 2024-04-13 LAB — COLOGUARD: COLOGUARD: POSITIVE — AB
